# Patient Record
Sex: Male | Born: 1980 | Race: Black or African American | Hispanic: No | Marital: Married | Smoking: Never smoker
Health system: Southern US, Community
[De-identification: ages and names within clinical notes are randomized; demographics above are authoritative.]

## PROBLEM LIST (undated history)

## (undated) DIAGNOSIS — M3313 Other dermatomyositis without myopathy: Secondary | ICD-10-CM

## (undated) DIAGNOSIS — I10 Essential (primary) hypertension: Secondary | ICD-10-CM

## (undated) DIAGNOSIS — M3390 Dermatopolymyositis, unspecified, organ involvement unspecified: Secondary | ICD-10-CM

## (undated) DIAGNOSIS — R519 Headache, unspecified: Secondary | ICD-10-CM

## (undated) HISTORY — DX: Dermatopolymyositis, unspecified, organ involvement unspecified: M33.90

## (undated) HISTORY — DX: Headache, unspecified: R51.9

## (undated) HISTORY — PX: MUSCLE BIOPSY: SHX716

## (undated) HISTORY — DX: Other dermatomyositis without myopathy: M33.13

---
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
## 2010-10-21 ENCOUNTER — Ambulatory Visit: Payer: Self-pay | Admitting: Diagnostic Radiology

## 2010-10-21 ENCOUNTER — Emergency Department (HOSPITAL_BASED_OUTPATIENT_CLINIC_OR_DEPARTMENT_OTHER): Admission: EM | Admit: 2010-10-21 | Discharge: 2010-10-21 | Payer: Self-pay | Admitting: Emergency Medicine

## 2010-10-24 ENCOUNTER — Emergency Department (HOSPITAL_BASED_OUTPATIENT_CLINIC_OR_DEPARTMENT_OTHER): Admission: EM | Admit: 2010-10-24 | Discharge: 2010-10-24 | Payer: Self-pay | Admitting: Emergency Medicine

## 2011-04-19 ENCOUNTER — Emergency Department (HOSPITAL_BASED_OUTPATIENT_CLINIC_OR_DEPARTMENT_OTHER)
Admission: EM | Admit: 2011-04-19 | Discharge: 2011-04-19 | Disposition: A | Discharge status: Home / Self Care | Payer: Self-pay | Attending: Emergency Medicine | Admitting: Emergency Medicine

## 2011-04-19 DIAGNOSIS — K029 Dental caries, unspecified: Secondary | ICD-10-CM | POA: Insufficient documentation

## 2011-04-19 DIAGNOSIS — K047 Periapical abscess without sinus: Secondary | ICD-10-CM | POA: Insufficient documentation

## 2012-11-21 ENCOUNTER — Emergency Department (HOSPITAL_BASED_OUTPATIENT_CLINIC_OR_DEPARTMENT_OTHER): Payer: Self-pay

## 2012-11-21 ENCOUNTER — Emergency Department (HOSPITAL_BASED_OUTPATIENT_CLINIC_OR_DEPARTMENT_OTHER)
Admission: EM | Admit: 2012-11-21 | Discharge: 2012-11-21 | Disposition: A | Discharge status: Home / Self Care | Payer: Self-pay | Attending: Emergency Medicine | Admitting: Emergency Medicine

## 2012-11-21 ENCOUNTER — Encounter (HOSPITAL_BASED_OUTPATIENT_CLINIC_OR_DEPARTMENT_OTHER): Payer: Self-pay | Admitting: Student

## 2012-11-21 DIAGNOSIS — J029 Acute pharyngitis, unspecified: Secondary | ICD-10-CM | POA: Insufficient documentation

## 2012-11-21 DIAGNOSIS — J3489 Other specified disorders of nose and nasal sinuses: Secondary | ICD-10-CM | POA: Insufficient documentation

## 2012-11-21 DIAGNOSIS — R05 Cough: Secondary | ICD-10-CM

## 2012-11-21 DIAGNOSIS — R059 Cough, unspecified: Secondary | ICD-10-CM | POA: Insufficient documentation

## 2012-11-21 DIAGNOSIS — R0789 Other chest pain: Secondary | ICD-10-CM | POA: Insufficient documentation

## 2012-11-21 DIAGNOSIS — F17200 Nicotine dependence, unspecified, uncomplicated: Secondary | ICD-10-CM | POA: Insufficient documentation

## 2012-11-21 MED ORDER — BENZONATATE 100 MG PO CAPS: 100.0000 mg | ORAL_CAPSULE | Freq: Three times a day (TID) | ORAL | Status: DC

## 2012-11-21 NOTE — ED Provider Notes (Signed)
Medical screening examination/treatment/procedure(s) were performed by non-physician practitioner and as supervising physician I was immediately available for consultation/collaboration.  Marwan T Powers, MD 11/21/12 2219 

## 2012-11-21 NOTE — ED Notes (Signed)
NP at bedside.

## 2012-11-21 NOTE — ED Provider Notes (Signed)
History     CSN: [id number]  Arrival date & time 11/21/12  1527   First MD Initiated Contact with Patient 11/21/12 1625      Chief Complaint  Patient presents with  . Cough  . Sore Throat    (Consider location/radiation/quality/duration/timing/severity/associated sxs/prior treatment) HPI Comments: Pt states that he has generalized cp when he coughs:pt denies fever  Patient is a 31 y.o. male presenting with cough. The history is provided by the patient. No language interpreter was used.  Cough This is a new problem. The current episode started more than 2 days ago. The problem occurs constantly. The problem has not changed since onset.The cough is productive of sputum. There has been no fever. Associated symptoms include rhinorrhea and sore throat. He has tried nothing for the symptoms. He is a smoker.    History reviewed. No pertinent past medical history.  History reviewed. No pertinent past surgical history.  History reviewed. No pertinent family history.  History  Substance Use Topics  . Smoking status: Current Every Day Smoker  . Smokeless tobacco: Not on file  . Alcohol Use: Yes      Review of Systems  Constitutional: Negative.   HENT: Positive for sore throat and rhinorrhea.   Respiratory: Negative.   Cardiovascular: Negative.     Allergies  Penicillins  Home Medications  No current outpatient prescriptions on file.  BP 148/89  Pulse 56  Temp 98.6 F (37 C) (Oral)  Ht 5\' 11"  (1.803 m)  Wt 300 lb (136.079 kg)  BMI 41.84 kg/m2  SpO2 100%  Physical Exam  Nursing note and vitals reviewed. Constitutional: He is oriented to person, place, and time. He appears well-developed and well-nourished.  HENT:  Head: Normocephalic and atraumatic.  Right Ear: External ear normal.  Left Ear: External ear normal.  Nose: Rhinorrhea present.  Mouth/Throat: Posterior oropharyngeal erythema present.  Cardiovascular: Normal rate and regular rhythm.     Pulmonary/Chest: Effort normal and breath sounds normal.  Musculoskeletal: Normal range of motion.  Neurological: He is alert and oriented to person, place, and time.  Skin: Skin is dry.    ED Course  Procedures (including critical care time)  Labs Reviewed - No data to display Dg Chest 2 View  11/21/2012  *RADIOLOGY REPORT*  Clinical Data: 31 year old male with chest pain and cough.  CHEST - 2 VIEW  Comparison: None  Findings: The cardiomediastinal silhouette is unremarkable. There is no evidence of focal airspace disease, pulmonary edema, suspicious pulmonary nodule/mass, pleural effusion, or pneumothorax. No acute bony abnormalities are identified.  IMPRESSION: No evidence of active cardiopulmonary disease.   Original Report Authenticated By: Harmon Pier, M.D.      1. Cough       MDM  Will treat symptomatically with something for the cough       Teressa Lower, NP 11/21/12 1746

## 2012-11-21 NOTE — ED Notes (Signed)
Pt in with c/o URI s/sx with cough, congestion sore throat and pain in chest when coughing

## 2013-08-03 ENCOUNTER — Encounter (HOSPITAL_BASED_OUTPATIENT_CLINIC_OR_DEPARTMENT_OTHER): Payer: Self-pay

## 2013-08-03 ENCOUNTER — Emergency Department (HOSPITAL_BASED_OUTPATIENT_CLINIC_OR_DEPARTMENT_OTHER)
Admission: EM | Admit: 2013-08-03 | Discharge: 2013-08-03 | Disposition: A | Discharge status: Home / Self Care | Payer: Self-pay | Attending: Emergency Medicine | Admitting: Emergency Medicine

## 2013-08-03 DIAGNOSIS — B353 Tinea pedis: Secondary | ICD-10-CM | POA: Insufficient documentation

## 2013-08-03 DIAGNOSIS — F17200 Nicotine dependence, unspecified, uncomplicated: Secondary | ICD-10-CM | POA: Insufficient documentation

## 2013-08-03 DIAGNOSIS — Z79899 Other long term (current) drug therapy: Secondary | ICD-10-CM | POA: Insufficient documentation

## 2013-08-03 DIAGNOSIS — Z880 Allergy status to penicillin: Secondary | ICD-10-CM | POA: Insufficient documentation

## 2013-08-03 DIAGNOSIS — I10 Essential (primary) hypertension: Secondary | ICD-10-CM | POA: Insufficient documentation

## 2013-08-03 MED ORDER — FLUCONAZOLE 200 MG PO TABS: 200.0000 mg | ORAL_TABLET | ORAL | Status: AC

## 2013-08-03 NOTE — ED Provider Notes (Signed)
CSN: [id number]     Arrival date & time 08/03/13  4098 History   First MD Initiated Contact with Patient 08/03/13 [phone]     Chief Complaint  Patient presents with  . Foot Swelling   (Consider location/radiation/quality/duration/timing/severity/associated sxs/prior Treatment) HPI 32 y.o. Male presents with cracked painful areas between toes on right. Previouslydiagnosedwith tinea pedis and used otc topical antifungal cream for a month without resolution.  STates stopped usding this several weeks ago and now worse with some more bleeding and pain and swelling over intertriginous area of toe 4 and 5 on right foot.   No past medical history on file. No past surgical history on file. No family history on file. History  Substance Use Topics  . Smoking status: Current Every Day Smoker  . Smokeless tobacco: Not on file  . Alcohol Use: Yes    Review of Systems  All other systems reviewed and are negative.    Allergies  Penicillins  Home Medications   Current Outpatient Rx  Name  Route  Sig  Dispense  Refill  . benzonatate (TESSALON) 100 MG capsule   Oral   Take 1 capsule (100 mg total) by mouth every 8 (eight) hours.   21 capsule   0    BP 151/102  Pulse 60  Temp(Src) 98 F (36.7 C) (Oral)  Resp 16  SpO2 98% Physical Exam  Nursing note and vitals reviewed. Constitutional: He appears well-developed and well-nourished.  HENT:  Head: Normocephalic and atraumatic.  Musculoskeletal: Normal range of motion.       Feet:  Maceration with some skin scaling and thickening with fissuring present.   Ankle, calf,and proximal leg normal.     ED Course  Procedures (including critical care time) Labs Review Labs Reviewed - No data to display Imaging Review No results found.  MDM  No diagnosis found. 32 y.o. 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 He will be treated with fluconazole for 4 weeks.  He is given instructions and printed information.       Hilario Quarry, MD 08/03/13 1002

## 2013-08-03 NOTE — ED Notes (Signed)
Swollen leg onset yesterday worsen this morning upon waking up. Non-pitting edema localized to the right foot only. Painful to walk and touch, sensation intact, able to ambulate.

## 2013-08-10 ENCOUNTER — Encounter (HOSPITAL_BASED_OUTPATIENT_CLINIC_OR_DEPARTMENT_OTHER): Payer: Self-pay | Admitting: *Deleted

## 2013-08-10 ENCOUNTER — Emergency Department (HOSPITAL_BASED_OUTPATIENT_CLINIC_OR_DEPARTMENT_OTHER)
Admission: EM | Admit: 2013-08-10 | Discharge: 2013-08-10 | Disposition: A | Discharge status: Home / Self Care | Payer: Self-pay | Attending: Emergency Medicine | Admitting: Emergency Medicine

## 2013-08-10 DIAGNOSIS — Z880 Allergy status to penicillin: Secondary | ICD-10-CM | POA: Insufficient documentation

## 2013-08-10 DIAGNOSIS — F17200 Nicotine dependence, unspecified, uncomplicated: Secondary | ICD-10-CM | POA: Insufficient documentation

## 2013-08-10 DIAGNOSIS — W57XXXA Bitten or stung by nonvenomous insect and other nonvenomous arthropods, initial encounter: Secondary | ICD-10-CM | POA: Insufficient documentation

## 2013-08-10 DIAGNOSIS — Y9389 Activity, other specified: Secondary | ICD-10-CM | POA: Insufficient documentation

## 2013-08-10 DIAGNOSIS — Y929 Unspecified place or not applicable: Secondary | ICD-10-CM | POA: Insufficient documentation

## 2013-08-10 DIAGNOSIS — S1096XA Insect bite of unspecified part of neck, initial encounter: Secondary | ICD-10-CM | POA: Insufficient documentation

## 2013-08-10 DIAGNOSIS — L989 Disorder of the skin and subcutaneous tissue, unspecified: Secondary | ICD-10-CM

## 2013-08-10 MED ORDER — MUPIROCIN CALCIUM 2 % EX CREA
TOPICAL_CREAM | Freq: Once | CUTANEOUS | Status: AC
  Administered 2013-08-10: 1 via TOPICAL
  Filled 2013-08-10: qty 15

## 2013-08-10 NOTE — ED Notes (Signed)
MD at bedside. 

## 2013-08-10 NOTE — ED Provider Notes (Signed)
CSN: [id number]     Arrival date & time 08/10/13  2253 History  This chart was scribed for No att. providers found by Joaquin Music, ED Scribe. This patient was seen in room MH07/MH07 and the patient's care was started at 11:09 PM     Chief Complaint  Patient presents with  . Insect Bite   HPI This is a 32 year old male who states he was bitten on the right occipital scalp 4 days ago. He thinks it was an insect. Since then he has developed a small crusted papule. He states it is not painful but is "irritating". He has been mashing it but has not been able to express any pus. There is no associated lymphadenopathy or systemic symptoms such as fever or malaise.  History reviewed. No pertinent past medical history. History reviewed. No pertinent past surgical history. History reviewed. No pertinent family history. History  Substance Use Topics  . Smoking status: Current Every Day Smoker  . Smokeless tobacco: Not on file  . Alcohol Use: No    Review of Systems  All other systems reviewed and are negative.    Allergies  Penicillins  Home Medications   Current Outpatient Rx  Name  Route  Sig  Dispense  Refill  . benzonatate (TESSALON) 100 MG capsule   Oral   Take 1 capsule (100 mg total) by mouth every 8 (eight) hours.   21 capsule   0   . fluconazole (DIFLUCAN) 200 MG tablet   Oral   Take 1 tablet (200 mg total) by mouth once a week.   6 tablet   0    BP 160/92  Pulse 89  Temp(Src) 98.3 F (36.8 C)  Resp 20  SpO2 100%  Physical Exam General: Well-developed, well-nourished male in no acute distress; appearance consistent with age of record HENT: normocephalic; atraumatic Eyes: Normal appearance Neck: supple; no lymphadenopathy Heart: regular rate and rhythm Lungs: Normal respiratory effort and excursion Abdomen: soft; nondistended Extremities: No deformity; full range of motion Neurologic: Awake, alert and oriented; motor function intact in all  extremities and symmetric; no facial droop Skin: Warm and dry; crusted papule right occipital scalp Psychiatric: Normal mood and affect    ED Course  Procedures DIAGNOSTIC STUDIES: Oxygen Saturation is 100% on RA, normal by my interpretation.     MDM      Hanley Seamen, MD 08/10/13 [phone]

## 2013-08-10 NOTE — ED Notes (Signed)
Patient states that he was bitten by ? Insect about 4 days ago on the back of his head.

## 2014-04-03 ENCOUNTER — Encounter (HOSPITAL_BASED_OUTPATIENT_CLINIC_OR_DEPARTMENT_OTHER): Payer: Self-pay | Admitting: Emergency Medicine

## 2014-04-03 ENCOUNTER — Emergency Department (HOSPITAL_BASED_OUTPATIENT_CLINIC_OR_DEPARTMENT_OTHER)
Admission: EM | Admit: 2014-04-03 | Discharge: 2014-04-03 | Disposition: A | Discharge status: Home / Self Care | Payer: Self-pay | Attending: Emergency Medicine | Admitting: Emergency Medicine

## 2014-04-03 DIAGNOSIS — B353 Tinea pedis: Secondary | ICD-10-CM

## 2014-04-03 DIAGNOSIS — B351 Tinea unguium: Secondary | ICD-10-CM

## 2014-04-03 DIAGNOSIS — Z880 Allergy status to penicillin: Secondary | ICD-10-CM | POA: Insufficient documentation

## 2014-04-03 DIAGNOSIS — Z87891 Personal history of nicotine dependence: Secondary | ICD-10-CM | POA: Insufficient documentation

## 2014-04-03 NOTE — ED Notes (Signed)
Rt great toe nail was painful and was told had fungus  Then went to nail spa,  Still painful

## 2014-04-03 NOTE — ED Notes (Signed)
MD at bedside. 

## 2014-04-03 NOTE — ED Provider Notes (Signed)
CSN: [id number]     Arrival date & time 04/03/14  2311 History   This chart was scribed for Andrew Byrd Smitty CordsK Andrew Odom-Rasch, MD by Carl Bestelina Holson, ED Scribe. This patient was seen in room MH03/MH03 and the patient's care was started at 11:38 PM.     Chief Complaint  Patient presents with  . Nail Problem   The history is provided by the patient. No language interpreter was used.   HPI Comments: Andrew Byrd is a 33 y.o. male who presents to the Emergency Department complaining of constant right toenail pain caused by toenail fungus.  He states that he got a pedicure today which aggravated his pain.  The patient states that he has tried topical fungal removal cream with no relief to his symptoms.    History reviewed. No pertinent past medical history. History reviewed. No pertinent past surgical history. No family history on file. History  Substance Use Topics  . Smoking status: Former Games developermoker  . Smokeless tobacco: Not on file  . Alcohol Use: No    Review of Systems  Musculoskeletal: Positive for arthralgias.  All other systems reviewed and are negative.     Allergies  Penicillins  Home Medications   Prior to Admission medications   Not on File   Triage Vitals: BP 151/95  Pulse 58  Temp(Src) 98 F (36.7 C) (Oral)  Resp 20  Ht 5\' 10"  (1.778 m)  Wt 280 lb (127.007 kg)  BMI 40.18 kg/m2  SpO2 98%  Physical Exam  Constitutional: He is oriented to person, place, and time. He appears well-developed and well-nourished. No distress.  HENT:  Head: Normocephalic and atraumatic.  Right Ear: External ear normal.  Left Ear: External ear normal.  Mouth/Throat: Oropharynx is clear and moist. No oropharyngeal exudate.  Eyes: Conjunctivae are normal. Pupils are equal, round, and reactive to light. No scleral icterus.  Neck: Normal range of motion. Neck supple. No thyromegaly present.  Cardiovascular: Normal rate and regular rhythm.  Exam reveals no gallop and no friction rub.   No murmur  heard. Pulmonary/Chest: Effort normal and breath sounds normal. No respiratory distress. He has no wheezes. He has no rales.  Abdominal: Soft. Bowel sounds are normal. He exhibits no distension. There is no tenderness. There is no rebound.  Musculoskeletal: Normal range of motion.  Neurological: He is alert and oriented to person, place, and time.  Skin: Skin is warm and dry. No rash noted.  Onchomycosis of the digits of the right foot.  Psychiatric: He has a normal mood and affect. His behavior is normal.    ED Course  Procedures (including critical care time)  DIAGNOSTIC STUDIES: Oxygen Saturation is 98% on room air, normal by my interpretation.    COORDINATION OF CARE: 11:40 PM- Advised the patient to follow-up with a podiatrist or family doctor and the patient agreed to the treatment plan.   Labs Review Labs Reviewed - No data to display  Imaging Review No results found.   EKG Interpretation None      MDM   Final diagnoses:  None    onchomycosis of toenails.  Follow up with podiatry for ongoing care.     I personally performed the services described in this documentation, which was scribed in my presence. The recorded information has been reviewed and is accurate.    Jasmine AweApril K Zamoria Boss-Rasch, MD 04/04/14 [phone]

## 2014-04-03 NOTE — Discharge Instructions (Signed)
Athlete's Foot °Tinea Pedis, more commonly know as athlete's foot, is a contagious fungal skin infection of the feet. Athlete's foot usually affects the skin between the fourth and fifth toes. It is called athlete's foot because it is a common occurrence in athletes. °SYMPTOMS  °· Scales on the feet, most commonly between the toes, that appear moist, soft, gray-white, or red. °· Dead skin between the toes. °· Itching in the inflamed areas. °· Damp, musty foot odor. °· Sometimes, small blisters on the feet, caused by a hypersensitivity to the fungus. °CAUSES  °Infection is caused by contact with a fungus or yeast. The fungus or yeast typically reside in moist socks and shoes, because the fungus thrives in dark, moist environments. °RISK INCREASES WITH: °· Walking barefoot public places such as bathrooms or showers °· Poor hygiene of the feet: infrequent washing of the feet and/or infrequent changing of shoes or socks. °· Hot, humid weather. °· Forgetting to dry the spaces between your toes. °PREVENTION °· Follow good locker room hygiene. °· Use your own towels. °· Wear shoes or sandals. °· Wash with warm or hot water and soap. °· Wash your feet daily. Dry thoroughly, especially between the toes. Dust with talcum powder or antifungal powder. °· Allow feet to dry out by occasionally walking barefoot. °· Change socks daily. °· Wear socks made of cotton, wool, or other natural absorbent fibers. Avoid socks made from synthetic fibers. °PROGNOSIS  °With appropriate treatment athlete's foot typically may resolve within 3 weeks, although, recurrence is common.  °RELATED COMPLICATIONS  °· Chronic infection or recurrence, especially if not appropriately or completely treated. °· Bacterial infection on top of the fungal infection in the affected area (bacterial superinfection or secondary bacterial infection). °· Rarely, an allergic autoimmune response to the infection on the hands and face. °TREATMENT °Initially, keep the  affected area dry and cool. Remove the scaly and dead material if it is present. Change socks daily. Walking barefoot or wearing sandals whenever possible helps dry the affected area. Use antifungal powders, creams, or ointments after each bath. If the infection does not respond to topical treatment, contact your caregiver and he or she may prescribe oral antifungal medication. °MEDICATION  °· Non-prescription antifungal creams, ointments, or powders can be used on your feet or toes or in shoes. °· Anti-itch medications may be prescribed as necessary by your caregiver. Use only as directed and only as much as you need. °· Oral antifungal medications may be prescribed by your caregiver. Take the entire course of medication as prescribed. °SEEK MEDICAL CARE IF:  °· Symptoms get worse or do not improve in 2 weeks despite treatment. °· New, unexplained symptoms develop (drugs used in treatment may produce side effects). °Document Released: 11/13/2005 Document Revised: 02/05/2012 Document Reviewed: 02/25/2009 °[employer]® Patient Information ©2014 [employer]. ° °

## 2014-04-04 ENCOUNTER — Encounter (HOSPITAL_BASED_OUTPATIENT_CLINIC_OR_DEPARTMENT_OTHER): Payer: Self-pay | Admitting: Emergency Medicine

## 2015-10-07 ENCOUNTER — Encounter (HOSPITAL_BASED_OUTPATIENT_CLINIC_OR_DEPARTMENT_OTHER): Payer: Self-pay | Admitting: *Deleted

## 2015-10-07 ENCOUNTER — Emergency Department (HOSPITAL_BASED_OUTPATIENT_CLINIC_OR_DEPARTMENT_OTHER)
Admission: EM | Admit: 2015-10-07 | Discharge: 2015-10-07 | Disposition: A | Discharge status: Home / Self Care | Payer: Self-pay | Attending: Emergency Medicine | Admitting: Emergency Medicine

## 2015-10-07 DIAGNOSIS — I10 Essential (primary) hypertension: Secondary | ICD-10-CM | POA: Insufficient documentation

## 2015-10-07 DIAGNOSIS — R600 Localized edema: Secondary | ICD-10-CM | POA: Insufficient documentation

## 2015-10-07 DIAGNOSIS — M7989 Other specified soft tissue disorders: Secondary | ICD-10-CM

## 2015-10-07 DIAGNOSIS — Z880 Allergy status to penicillin: Secondary | ICD-10-CM | POA: Insufficient documentation

## 2015-10-07 DIAGNOSIS — Z87891 Personal history of nicotine dependence: Secondary | ICD-10-CM | POA: Insufficient documentation

## 2015-10-07 HISTORY — DX: Essential (primary) hypertension: I10

## 2015-10-07 LAB — CBC WITH DIFFERENTIAL/PLATELET
BASOS ABS: 0 10*3/uL (ref 0.0–0.1)
Basophils Relative: 0 %
EOS ABS: 0.2 10*3/uL (ref 0.0–0.7)
Eosinophils Relative: 4 %
HEMATOCRIT: 40.3 % (ref 39.0–52.0)
HEMOGLOBIN: 12.8 g/dL — AB (ref 13.0–17.0)
LYMPHS PCT: 31 %
Lymphs Abs: 1.6 10*3/uL (ref 0.7–4.0)
MCH: 30.8 pg (ref 26.0–34.0)
MCHC: 31.8 g/dL (ref 30.0–36.0)
MCV: 96.9 fL (ref 78.0–100.0)
MONOS PCT: 9 %
Monocytes Absolute: 0.5 10*3/uL (ref 0.1–1.0)
NEUTROS PCT: 56 %
Neutro Abs: 3 10*3/uL (ref 1.7–7.7)
Platelets: 186 10*3/uL (ref 150–400)
RBC: 4.16 MIL/uL — AB (ref 4.22–5.81)
RDW: 13.8 % (ref 11.5–15.5)
WBC: 5.3 10*3/uL (ref 4.0–10.5)

## 2015-10-07 LAB — BASIC METABOLIC PANEL
ANION GAP: 6 (ref 5–15)
BUN: 12 mg/dL (ref 6–20)
CHLORIDE: 108 mmol/L (ref 101–111)
CO2: 26 mmol/L (ref 22–32)
CREATININE: 0.86 mg/dL (ref 0.61–1.24)
Calcium: 9 mg/dL (ref 8.9–10.3)
GFR calc non Af Amer: >60 mL/min (ref >60)
Glucose, Bld: 105 mg/dL — ABNORMAL HIGH (ref 65–99)
Potassium: 4.2 mmol/L (ref 3.5–5.1)
SODIUM: 140 mmol/L (ref 135–145)

## 2015-10-07 NOTE — ED Notes (Signed)
MD at bedside. 

## 2015-10-07 NOTE — ED Provider Notes (Signed)
CSN: [id number]     Arrival date & time 10/07/15  [id number] History   First MD Initiated Contact with Patient 10/07/15 0830     Chief Complaint  Patient presents with  . Foot Swelling     (Consider location/radiation/quality/duration/timing/severity/associated sxs/prior Treatment) HPI Comments: Patient is a 34 year old male with history of hypertension but has not been on medication for several years presents today with a 1 year history of swelling in his lower legs bilaterally. He states that maybe it's gotten worse in the last few weeks to 1 month. He denies any pain in the legs, chest pain nursing will shortness of breath. 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 He denies any unilateral leg pain or swelling worsen one leg. It seems to be worse when he wakes up in the morning and improves as time goes on. He denies any joint pain or swelling.  The history is provided by the patient.    Past Medical History  Diagnosis Date  . Hypertension    History reviewed. No pertinent past surgical history. No family history on file. Social History  Substance Use Topics  . Smoking status: Former Games developermoker  . Smokeless tobacco: None  . Alcohol Use: No    Review of Systems  All other systems reviewed and are negative.     Allergies  Other and Penicillins  Home Medications   Prior to Admission medications   Not on File   BP 137/80 mmHg  Pulse 62  Temp(Src) 98.3 F (36.8 C) (Oral)  Resp 16  Ht 5\' 11"  (1.803 m)  Wt 280 lb (127.007 kg)  BMI 39.07 kg/m2  SpO2 98% Physical Exam  Constitutional: He is oriented to person, place, and time. He appears well-developed and well-nourished. No distress.  HENT:  Head: Normocephalic and atraumatic.  Mouth/Throat: Oropharynx is clear and moist.  Eyes: Conjunctivae and EOM are normal. Pupils are equal, round, and reactive to light.  Neck: Normal range of motion. Neck supple.  Cardiovascular: Normal rate, regular  rhythm and intact distal pulses.   No murmur heard. Pulmonary/Chest: Effort normal and breath sounds normal. No respiratory distress. He has no wheezes. He has no rales.  Abdominal: Soft. He exhibits no distension. There is no tenderness. There is no rebound and no guarding.  Musculoskeletal: Normal range of motion. He exhibits edema. He exhibits no tenderness.  Trace edema to bilateral lower extremities  Neurological: He is alert and oriented to person, place, and time.  Skin: Skin is warm and dry. No rash noted. No erythema.  Psychiatric: He has a normal mood and affect. His behavior is normal.  Nursing note and vitals reviewed.   ED Course  Procedures (including critical care time) Labs Review Labs Reviewed  CBC WITH DIFFERENTIAL/PLATELET - Abnormal; Notable for the following:    RBC 4.16 (*)    Hemoglobin 12.8 (*)    All other components within normal limits  BASIC METABOLIC PANEL - Abnormal; Notable for the following:    Glucose, Bld 105 (*)    All other components within normal limits    Imaging Review No results found. I have personally reviewed and evaluated these images and lab results as part of my medical decision-making.   EKG Interpretation None      MDM   Final diagnoses:  Swelling of lower extremity    Patient presenting today with complaint of lower extremities swelling over the last year but worse in the last  few weeks. The swelling is worse in the morning and improves as the day goes on. He denies any shortness of breath, chest pain, headaches. He once was on medication for blood pressure but has not taken anything in quite some time. 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 On exam patient's vital signs are within normal limits including his blood pressure. Minimal trace edema in bilateral lower extremities with evidence of varicose veins but normal pulses. CBC and BMP are within normal  limits without evidence of renal disease or diabetes. Low suspicion the patient's lower extremity edema is related to a cardiac cause him feel most likely venous insufficiency. Encourage patient to avoid excessive salt in his diet as well as try compression stockings and if that doesn't work follow-up with the PCP.    Gwyneth Sprout, MD 10/07/15 1330

## 2015-10-07 NOTE — Discharge Instructions (Signed)
Edema °Edema is an abnormal buildup of fluids in your body tissues. Edema is somewhat dependent on gravity to pull the fluid to the lowest place in your body. That makes the condition more common in the legs and thighs (lower extremities). Painless swelling of the feet and ankles is common and becomes more likely as you get older. It is also common in looser tissues, like around your eyes.  °When the affected area is squeezed, the fluid may move out of that spot and leave a dent for a few moments. This dent is called pitting.  °CAUSES  °There are many possible causes of edema. Eating too much salt and being on your feet or sitting for a long time can cause edema in your legs and ankles. Hot weather may make edema worse. Common medical causes of edema include: °· Heart failure. °· Liver disease. °· Kidney disease. °· Weak blood vessels in your legs. °· Cancer. °· An injury. °· Pregnancy. °· Some medications. °· Obesity.  °SYMPTOMS  °Edema is usually painless. Your skin may look swollen or shiny.  °DIAGNOSIS  °Your health care provider may be able to diagnose edema by asking about your medical history and doing a physical exam. You may need to have tests such as X-rays, an electrocardiogram, or blood tests to check for medical conditions that may cause edema.  °TREATMENT  °Edema treatment depends on the cause. If you have heart, liver, or kidney disease, you need the treatment appropriate for these conditions. General treatment may include: °· Elevation of the affected body part above the level of your heart. °· Compression of the affected body part. Pressure from elastic bandages or support stockings squeezes the tissues and forces fluid back into the blood vessels. This keeps fluid from entering the tissues. °· Restriction of fluid and salt intake. °· Use of a water pill (diuretic). These medications are appropriate only for some types of edema. They pull fluid out of your body and make you urinate more often. This  gets rid of fluid and reduces swelling, but diuretics can have side effects. Only use diuretics as directed by your health care provider. °HOME CARE INSTRUCTIONS  °· Keep the affected body part above the level of your heart when you are lying down.   °· Do not sit still or stand for prolonged periods.   °· Do not put anything directly under your knees when lying down. °· Do not wear constricting clothing or garters on your upper legs.   °· Exercise your legs to work the fluid back into your blood vessels. This may help the swelling go down.   °· Wear elastic bandages or support stockings to reduce ankle swelling as directed by your health care provider.   °· Eat a low-salt diet to reduce fluid if your health care provider recommends it.   °· Only take medicines as directed by your health care provider.  °SEEK MEDICAL CARE IF:  °· Your edema is not responding to treatment. °· You have heart, liver, or kidney disease and notice symptoms of edema. °· You have edema in your legs that does not improve after elevating them.   °· You have sudden and unexplained weight gain. °SEEK IMMEDIATE MEDICAL CARE IF:  °· You develop shortness of breath or chest pain.   °· You cannot breathe when you lie down. °· You develop pain, redness, or warmth in the swollen areas.   °· You have heart, liver, or kidney disease and suddenly get edema. °· You have a fever and your symptoms suddenly get worse. °MAKE SURE YOU:  °·   Understand these instructions. °· Will watch your condition. °· Will get help right away if you are not doing well or get worse. °  °This information is not intended to replace advice given to you by your health care provider. Make sure you discuss any questions you have with your health care provider. °  °Document Released: 11/13/2005 Document Revised: 12/04/2014 Document Reviewed: 09/05/2013 °[employer]vier Interactive Patient Education ©2016 [employer] ° °

## 2015-10-07 NOTE — ED Notes (Signed)
Patient states he has had lower extremity swelling for over one year.  Past history of HTN.

## 2016-08-23 ENCOUNTER — Encounter (HOSPITAL_BASED_OUTPATIENT_CLINIC_OR_DEPARTMENT_OTHER): Payer: Self-pay | Admitting: *Deleted

## 2016-08-23 ENCOUNTER — Emergency Department (HOSPITAL_BASED_OUTPATIENT_CLINIC_OR_DEPARTMENT_OTHER)
Admission: EM | Admit: 2016-08-23 | Discharge: 2016-08-23 | Disposition: A | Discharge status: Home / Self Care | Payer: No Typology Code available for payment source | Attending: Emergency Medicine | Admitting: Emergency Medicine

## 2016-08-23 DIAGNOSIS — R519 Headache, unspecified: Secondary | ICD-10-CM

## 2016-08-23 DIAGNOSIS — I10 Essential (primary) hypertension: Secondary | ICD-10-CM | POA: Diagnosis not present

## 2016-08-23 DIAGNOSIS — R51 Headache: Secondary | ICD-10-CM | POA: Insufficient documentation

## 2016-08-23 DIAGNOSIS — Z87891 Personal history of nicotine dependence: Secondary | ICD-10-CM | POA: Diagnosis not present
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
## 2017-04-26 ENCOUNTER — Emergency Department (HOSPITAL_BASED_OUTPATIENT_CLINIC_OR_DEPARTMENT_OTHER): Payer: Self-pay

## 2017-04-26 ENCOUNTER — Encounter (HOSPITAL_BASED_OUTPATIENT_CLINIC_OR_DEPARTMENT_OTHER): Payer: Self-pay | Admitting: *Deleted

## 2017-04-26 ENCOUNTER — Emergency Department (HOSPITAL_BASED_OUTPATIENT_CLINIC_OR_DEPARTMENT_OTHER)
Admission: EM | Admit: 2017-04-26 | Discharge: 2017-04-26 | Disposition: A | Discharge status: Home / Self Care | Payer: Self-pay | Attending: Emergency Medicine | Admitting: Emergency Medicine

## 2017-04-26 DIAGNOSIS — J069 Acute upper respiratory infection, unspecified: Secondary | ICD-10-CM | POA: Insufficient documentation

## 2017-04-26 DIAGNOSIS — B9789 Other viral agents as the cause of diseases classified elsewhere: Secondary | ICD-10-CM

## 2017-04-26 DIAGNOSIS — J988 Other specified respiratory disorders: Secondary | ICD-10-CM

## 2017-04-26 DIAGNOSIS — I10 Essential (primary) hypertension: Secondary | ICD-10-CM | POA: Insufficient documentation

## 2017-04-26 DIAGNOSIS — R51 Headache: Secondary | ICD-10-CM | POA: Insufficient documentation

## 2017-04-26 DIAGNOSIS — Z87891 Personal history of nicotine dependence: Secondary | ICD-10-CM | POA: Insufficient documentation

## 2017-04-26 LAB — RAPID STREP SCREEN (MED CTR MEBANE ONLY): Streptococcus, Group A Screen (Direct): NEGATIVE

## 2017-04-26 MED ORDER — IPRATROPIUM-ALBUTEROL 0.5-2.5 (3) MG/3ML IN SOLN
3.0000 mL | Freq: Once | RESPIRATORY_TRACT | Status: AC
  Administered 2017-04-26: 3 mL via RESPIRATORY_TRACT
  Filled 2017-04-26: qty 3

## 2017-04-26 MED ORDER — IBUPROFEN 800 MG PO TABS: 800.0000 mg | ORAL_TABLET | Freq: Three times a day (TID) | ORAL | 0 refills | Status: DC | PRN

## 2017-04-26 MED ORDER — FLUTICASONE PROPIONATE 50 MCG/ACT NA SUSP: 2.0000 | Freq: Every day | NASAL | 0 refills | Status: DC

## 2017-04-26 MED ORDER — KETOROLAC TROMETHAMINE 60 MG/2ML IM SOLN
60.0000 mg | Freq: Once | INTRAMUSCULAR | Status: DC
  Filled 2017-04-26: qty 2

## 2017-04-26 MED ORDER — GUAIFENESIN-DM 100-10 MG/5ML PO SYRP: 5.0000 mL | ORAL_SOLUTION | ORAL | 0 refills | Status: DC | PRN

## 2017-04-26 MED ORDER — ALBUTEROL SULFATE HFA 108 (90 BASE) MCG/ACT IN AERS
2.0000 | INHALATION_SPRAY | Freq: Once | RESPIRATORY_TRACT | Status: AC
  Administered 2017-04-26: 2 via RESPIRATORY_TRACT
  Filled 2017-04-26: qty 6.7

## 2017-04-26 MED ORDER — PSEUDOEPHEDRINE HCL 60 MG PO TABS: 60.0000 mg | ORAL_TABLET | Freq: Four times a day (QID) | ORAL | 0 refills | Status: DC | PRN

## 2017-04-26 MED FILL — SUDOGEST 60 MG TABLET: 60 | 8 days supply | Qty: 30 | Fill #0

## 2017-04-26 MED FILL — FLUTICASONE PROP 50 MCG SPR: 50 | 30 days supply | Qty: 16 | Fill #0

## 2017-04-26 MED FILL — IBUPROFEN 800 MG TAB: 800 | 5 days supply | Qty: 15 | Fill #0

## 2017-04-26 NOTE — Discharge Instructions (Signed)
Read the information below.  Use the prescribed medication as directed.  Please discuss all new medications with your pharmacist.  You may return to the Emergency Department at any time for worsening condition or any new symptoms that concern you.  If you develop high fevers that do not resolve with tylenol or ibuprofen, you have difficulty swallowing or breathing, or you are unable to tolerate fluids by mouth, return to the ER for a recheck.    °

## 2017-04-26 NOTE — ED Triage Notes (Signed)
Sore throat x 3 days. Chills. Headache.

## 2017-04-26 NOTE — ED Provider Notes (Signed)
[facility]   CSN: [id number] Arrival date & time: 04/26/17  1208     History   Chief Complaint Chief Complaint  Patient presents with  . Sore Throat  . Headache    HPI Andrew Byrd is a 36 y.o. male.  HPI   Pt p/w sore throat, nasal congestion, sinus pressure, chills, cough, and headache x 3 days. Loose stools.  Decreased appetite.  Gradually worsening.   Headache is frontal.  Cough is productive of blood tinged sputum.  He is SOB.  He is a smoker and has asthma.     Has tried OTC cold medications and goody powders without significant relief.   Denies fevers, myalgias.   Daughter sick with strep last week.    Past Medical History:  Diagnosis Date  . Hypertension     There are no active problems to display for this patient.   History reviewed. No pertinent surgical history.     Home Medications    Prior to Admission medications   Medication Sig Start Date End Date Taking? Authorizing Provider  fluticasone (FLONASE) 50 MCG/ACT nasal spray Place 2 sprays into both nostrils daily. 04/26/17   Trixie Dredge, PA-C  guaiFENesin-dextromethorphan (ROBITUSSIN DM) 100-10 MG/5ML syrup Take 5 mLs by mouth every 4 (four) hours as needed for cough (and congestion). 04/26/17   Trixie Dredge, PA-C  ibuprofen (ADVIL,MOTRIN) 800 MG tablet Take 1 tablet (800 mg total) by mouth every 8 (eight) hours as needed. 04/26/17   Trixie Dredge, PA-C  pseudoephedrine (SUDAFED) 60 MG tablet Take 1 tablet (60 mg total) by mouth every 6 (six) hours as needed for congestion. 04/26/17   Trixie Dredge, PA-C    Family History No family history on file.  Social History Social History  Substance Use Topics  . Smoking status: Former Games developer  . Smokeless tobacco: Never Used  . Alcohol use No     Allergies   Other and Penicillins   Review of Systems Review of Systems  Constitutional: Positive for appetite change and fatigue.  HENT: Positive for congestion, sinus pain, sinus  pressure and sore throat. Negative for drooling and trouble swallowing.   Respiratory: Positive for cough, shortness of breath and wheezing.   Skin: Negative for rash.  Allergic/Immunologic: Negative for immunocompromised state.     Physical Exam Updated Vital Signs BP 120/65 (BP Location: Right Arm)   Pulse 78   Temp 98.1 F (36.7 C) (Oral)   Resp 18   Ht 5\' 11"  (1.803 m)   Wt (!) 145.2 kg (320 lb)   SpO2 100%   BMI 44.63 kg/m   Physical Exam  Constitutional: He appears well-developed and well-nourished. No distress.  HENT:  Head: Normocephalic and atraumatic.  Nose: Mucosal edema present. Right sinus exhibits maxillary sinus tenderness. Left sinus exhibits maxillary sinus tenderness.  Mouth/Throat: Oropharynx is clear and moist and mucous membranes are normal. Mucous membranes are not dry. No oropharyngeal exudate, posterior oropharyngeal edema, posterior oropharyngeal erythema or tonsillar abscesses.  Eyes: Conjunctivae and EOM are normal. Right eye exhibits no discharge. Left eye exhibits no discharge.  Neck: Normal range of motion. Neck supple.  Cardiovascular: Normal rate and regular rhythm.   Pulmonary/Chest: Effort normal and breath sounds normal. No stridor. No respiratory distress. He has no wheezes. He has no rales.  Coarse breath sounds  Lymphadenopathy:    He has no cervical adenopathy.  Neurological: He is alert.  Skin: He is not diaphoretic.  Nursing note and vitals reviewed.  ED Treatments / Results  Labs (all labs ordered are listed, but only abnormal results are displayed) Labs Reviewed  RAPID STREP SCREEN (NOT AT [facility])  CULTURE, GROUP A STREP [facility])    EKG  EKG Interpretation None       Radiology Dg Chest 2 View  Result Date: 04/26/2017 CLINICAL DATA:  Cough and congestion with dyspnea x3 days. History of smoking. EXAM: CHEST  2 VIEW COMPARISON:  11/21/2012 FINDINGS: The heart size and mediastinal contours are within normal limits. Both  lungs are clear. The visualized skeletal structures are unremarkable. IMPRESSION: No active cardiopulmonary disease. Electronically Signed   By: Tollie Ethavid  Kwon M.D.   On: 04/26/2017 13:24    Procedures Procedures (including critical care time)  Medications Ordered in ED Medications  ketorolac (TORADOL) injection 60 mg (60 mg Intramuscular Not Given 04/26/17 1345)  ipratropium-albuterol (DUONEB) 0.5-2.5 (3) MG/3ML nebulizer solution 3 mL (3 mLs Nebulization Given 04/26/17 1327)  albuterol (PROVENTIL HFA;VENTOLIN HFA) 108 (90 Base) MCG/ACT inhaler 2 puff (2 puffs Inhalation Given 04/26/17 1422)     Initial Impression / Assessment and Plan / ED Course  I have reviewed the triage vital signs and the nursing notes.  Pertinent labs & imaging results that were available during my care of the patient were reviewed by me and considered in my medical decision making (see chart for details).    Afebrile, nontoxic patient with constellation of symptoms suggestive of viral syndrome.  No concerning findings on exam.  Discharged home with supportive care, PCP follow up.  Discussed result, findings, treatment, and follow up  with patient.  Pt given return precautions.  Pt verbalizes understanding and agrees with plan.      Final Clinical Impressions(s) / ED Diagnoses   Final diagnoses:  Viral respiratory illness    New Prescriptions Discharge Medication List as of 04/26/2017  2:11 PM    START taking these medications   Details  fluticasone (FLONASE) 50 MCG/ACT nasal spray Place 2 sprays into both nostrils daily., Starting Thu 04/26/2017, Print    guaiFENesin-dextromethorphan (ROBITUSSIN DM) 100-10 MG/5ML syrup Take 5 mLs by mouth every 4 (four) hours as needed for cough (and congestion)., Starting Thu 04/26/2017, Print    ibuprofen (ADVIL,MOTRIN) 800 MG tablet Take 1 tablet (800 mg total) by mouth every 8 (eight) hours as needed., Starting Thu 04/26/2017, Print    pseudoephedrine (SUDAFED) 60 MG tablet  Take 1 tablet (60 mg total) by mouth every 6 (six) hours as needed for congestion., Starting Thu 04/26/2017, [address]Print         Sathvika Ojo, HookerEmily, PA-C 04/26/17 1640    Geoffery Lyonselo, Douglas, MD 04/27/17 [id number]

## 2017-04-28 LAB — CULTURE, GROUP A STREP (THRC)

## 2018-12-02 ENCOUNTER — Other Ambulatory Visit: Payer: Self-pay

## 2019-01-30 ENCOUNTER — Other Ambulatory Visit: Payer: Self-pay

## 2019-01-30 ENCOUNTER — Encounter (HOSPITAL_BASED_OUTPATIENT_CLINIC_OR_DEPARTMENT_OTHER): Payer: Self-pay | Admitting: Emergency Medicine

## 2019-01-30 ENCOUNTER — Emergency Department (HOSPITAL_BASED_OUTPATIENT_CLINIC_OR_DEPARTMENT_OTHER)
Admission: EM | Admit: 2019-01-30 | Discharge: 2019-01-30 | Disposition: A | Discharge status: Home / Self Care | Payer: Self-pay | Attending: Emergency Medicine | Admitting: Emergency Medicine

## 2019-01-30 DIAGNOSIS — R0981 Nasal congestion: Secondary | ICD-10-CM | POA: Insufficient documentation

## 2019-01-30 DIAGNOSIS — R040 Epistaxis: Secondary | ICD-10-CM | POA: Insufficient documentation

## 2019-01-30 DIAGNOSIS — F1210 Cannabis abuse, uncomplicated: Secondary | ICD-10-CM | POA: Insufficient documentation

## 2019-01-30 DIAGNOSIS — Z87891 Personal history of nicotine dependence: Secondary | ICD-10-CM | POA: Insufficient documentation

## 2019-01-30 DIAGNOSIS — I10 Essential (primary) hypertension: Secondary | ICD-10-CM | POA: Insufficient documentation

## 2019-01-30 MED ORDER — FLUTICASONE PROPIONATE 50 MCG/ACT NA SUSP: 2.0000 | Freq: Every day | NASAL | 0 refills | Status: DC

## 2019-01-30 MED ORDER — OXYMETAZOLINE HCL 0.05 % NA SOLN
2.0000 | Freq: Two times a day (BID) | NASAL | Status: DC | PRN
  Administered 2019-01-30: 2 via NASAL
  Filled 2019-01-30: qty 30

## 2019-01-30 NOTE — ED Triage Notes (Signed)
Pt is c/o sinus problems x 3 days  Pt states he has pressure and that his nose has been bleeding  States his nose bled for about an hour tonight

## 2019-01-30 NOTE — ED Provider Notes (Signed)
[facility] Provider Note: [name], MD, FACEP  CSN: [id number] MRN: [id number] ARRIVAL: 01/30/19 at 0102 ROOM: MH02/MH02   CHIEF COMPLAINT  Sinus Problem   HISTORY OF PRESENT ILLNESS  01/30/19 1:14 AM Andrew Byrd is a 38 y.o. male with 3 days of sinus pressure and nasal congestion.  Yesterday evening he began having nosebleeds, 1 of which lasted about an hour.  It has subsequently abated.  He has been taking Tylenol Sinus but not using any nasal spray.  He denies fever, cough or body aches.    Past Medical History:  Diagnosis Date  . Hypertension     History reviewed. No pertinent surgical history.  Family History  Problem Relation Age of Onset  . Hypertension Other     Social History   Tobacco Use  . Smoking status: Former Games developer  . Smokeless tobacco: Never Used  Substance Use Topics  . Alcohol use: No  . Drug use: Yes    Types: Marijuana    Prior to Admission medications   Medication Sig Start Date End Date Taking? Authorizing Provider  fluticasone (FLONASE) 50 MCG/ACT nasal spray Place 2 sprays into both nostrils daily. 04/26/17   Trixie Dredge, PA-C  guaiFENesin-dextromethorphan (ROBITUSSIN DM) 100-10 MG/5ML syrup Take 5 mLs by mouth every 4 (four) hours as needed for cough (and congestion). 04/26/17   Trixie Dredge, PA-C  ibuprofen (ADVIL,MOTRIN) 800 MG tablet Take 1 tablet (800 mg total) by mouth every 8 (eight) hours as needed. 04/26/17   Trixie Dredge, PA-C  pseudoephedrine (SUDAFED) 60 MG tablet Take 1 tablet (60 mg total) by mouth every 6 (six) hours as needed for congestion. 04/26/17   Trixie Dredge, PA-C    Allergies Other and Penicillins   REVIEW OF SYSTEMS  Negative except as noted here or in the History of Present Illness.   PHYSICAL EXAMINATION  Initial Vital Signs Blood pressure (!) 153/94, pulse 62, temperature 97.7 F (36.5 C), temperature source Oral, resp. rate 16, height 5\' 11"  (1.803 m), weight 136.1 kg, SpO2 97  %.  Examination General: Well-developed, well-nourished male in no acute distress; appearance consistent with age of record HENT: normocephalic; atraumatic; pharynx normal; nasal congestion Eyes: pupils equal, round and reactive to light; extraocular muscles intact Neck: supple Heart: regular rate and rhythm Lungs: clear to auscultation bilaterally Abdomen: soft; nondistended; nontender; bowel sounds present Extremities: No deformity; full range of motion Neurologic: Awake, alert and oriented; motor function intact in all extremities and symmetric; no facial droop Skin: Warm and dry Psychiatric: Normal mood and affect   RESULTS  Summary of this visit's results, reviewed by myself:   EKG Interpretation  Date/Time:    Ventricular Rate:    PR Interval:    QRS Duration:   QT Interval:    QTC Calculation:   R Axis:     Text Interpretation:        Laboratory Studies: No results found for this or any previous visit (from the past 24 hour(s)). Imaging Studies: No results found.  ED COURSE and MDM  Nursing notes and initial vitals signs, including pulse oximetry, reviewed.  Vitals:   01/30/19 0108 01/30/19 0111  BP: (!) 153/94   Pulse: 62   Resp: 16   Temp: 97.7 F (36.5 C)   TempSrc: Oral   SpO2: 97%   Weight:  136.1 kg  Height:  5\' 11"  (1.803 m)   Will treat with a brief course of Afrin which should help with congestion as well as  epistaxis.  He was advised to continue the Tylenol Sinus.  PROCEDURES    ED DIAGNOSES     ICD-10-CM   1. Nasal congestion R09.81   2. Epistaxis R04.0        [name], MD 01/30/19 5465

## 2019-02-17 ENCOUNTER — Ambulatory Visit: Payer: Self-pay | Admitting: Medical

## 2019-07-27 ENCOUNTER — Emergency Department (HOSPITAL_BASED_OUTPATIENT_CLINIC_OR_DEPARTMENT_OTHER)
Admission: EM | Admit: 2019-07-27 | Discharge: 2019-07-27 | Disposition: A | Discharge status: Home / Self Care | Payer: Self-pay | Attending: Emergency Medicine | Admitting: Emergency Medicine

## 2019-07-27 ENCOUNTER — Encounter (HOSPITAL_BASED_OUTPATIENT_CLINIC_OR_DEPARTMENT_OTHER): Payer: Self-pay | Admitting: Emergency Medicine

## 2019-07-27 ENCOUNTER — Other Ambulatory Visit: Payer: Self-pay

## 2019-07-27 DIAGNOSIS — T7840XA Allergy, unspecified, initial encounter: Secondary | ICD-10-CM | POA: Insufficient documentation

## 2019-07-27 DIAGNOSIS — Z79899 Other long term (current) drug therapy: Secondary | ICD-10-CM | POA: Insufficient documentation

## 2019-07-27 DIAGNOSIS — I10 Essential (primary) hypertension: Secondary | ICD-10-CM | POA: Insufficient documentation

## 2019-07-27 DIAGNOSIS — Z87891 Personal history of nicotine dependence: Secondary | ICD-10-CM | POA: Insufficient documentation

## 2019-07-27 DIAGNOSIS — F1210 Cannabis abuse, uncomplicated: Secondary | ICD-10-CM | POA: Insufficient documentation

## 2019-07-27 DIAGNOSIS — R21 Rash and other nonspecific skin eruption: Secondary | ICD-10-CM | POA: Insufficient documentation

## 2019-07-27 MED ORDER — TRIAMCINOLONE ACETONIDE 0.1 % EX CREA: 1.0000 "application " | TOPICAL_CREAM | Freq: Two times a day (BID) | CUTANEOUS | 0 refills | Status: DC

## 2019-07-27 MED ORDER — PREDNISONE 20 MG PO TABS: 40.0000 mg | ORAL_TABLET | Freq: Every day | ORAL | 0 refills | Status: DC

## 2019-07-27 NOTE — Discharge Instructions (Signed)
You can also take benadryl as needed for itching

## 2019-07-27 NOTE — ED Provider Notes (Signed)
[facility] EMERGENCY DEPARTMENT Provider Note   CSN: [id number] Arrival date & time: 07/27/19  1635     History   Chief Complaint Chief Complaint  Patient presents with  . Allergic Reaction    HPI Andrew Byrd is a 38 y.o. male.     The history is provided by the patient.  Allergic Reaction Presenting symptoms: itching and rash   Severity:  Moderate Duration:  1 week Prior allergic episodes:  No prior episodes Context: new detergents/soaps   Context comment:  Started using a new body wash right before rash started Relieved by:  Nothing Worsened by:  Nothing Ineffective treatments:  OTC ointments   Past Medical History:  Diagnosis Date  . Hypertension     There are no active problems to display for this patient.   History reviewed. No pertinent surgical history.      Home Medications    Prior to Admission medications   Medication Sig Start Date End Date Taking? Authorizing Provider  fluticasone (FLONASE) 50 MCG/ACT nasal spray Place 2 sprays into both nostrils daily. 01/30/19   Molpus, John, MD  predniSONE (DELTASONE) 20 MG tablet Take 2 tablets (40 mg total) by mouth daily. 07/27/19   Blanchie Dessert, MD  triamcinolone cream (KENALOG) 0.1 % Apply 1 application topically 2 (two) times daily. 07/27/19   Blanchie Dessert, MD    Family History Family History  Problem Relation Age of Onset  . Hypertension Other     Social History Social History   Tobacco Use  . Smoking status: Former Research scientist (life sciences)  . Smokeless tobacco: Never Used  Substance Use Topics  . Alcohol use: No  . Drug use: Yes    Types: Marijuana     Allergies   Other and Penicillins   Review of Systems Review of Systems  Skin: Positive for itching and rash.  All other systems reviewed and are negative.    Physical Exam Updated Vital Signs BP (!) 152/110 (BP Location: Left Arm)   Pulse 79   Temp 99.4 F (37.4 C) (Oral)   Resp 20   Ht 6' (1.829 m)   Wt (!) 145.2 kg    SpO2 99%   BMI 43.40 kg/m   Physical Exam Vitals signs and nursing note reviewed.  Constitutional:      General: He is not in acute distress.    Appearance: Normal appearance. He is obese.  HENT:     Head: Normocephalic.     Nose: Nose normal.  Cardiovascular:     Rate and Rhythm: Normal rate.     Pulses: Normal pulses.  Pulmonary:     Effort: Pulmonary effort is normal.  Skin:    General: Skin is warm and dry.     Findings: Rash present.     Comments: Excoriated, maculopapular rash present over face, neck, back, chest, arms and abdomen.  It is patchy in nature with no vesicles, pustules or drainage  Neurological:     General: No focal deficit present.     Mental Status: He is alert and oriented to person, place, and time. Mental status is at baseline.  Psychiatric:        Mood and Affect: Mood normal.        Behavior: Behavior normal.        Thought Content: Thought content normal.      ED Treatments / Results  Labs (all labs ordered are listed, but only abnormal results are displayed) Labs Reviewed - No data to display  EKG None  Radiology No results found.  Procedures Procedures (including critical care time)  Medications Ordered in ED Medications - No data to display   Initial Impression / Assessment and Plan / ED Course  I have reviewed the triage vital signs and the nursing notes.  Pertinent labs & imaging results that were available during my care of the patient were reviewed by me and considered in my medical decision making (see chart for details).        Patient presenting today with symptoms most classic for an allergic reaction to a new body wash.  It appears to a flare of his eczema.  Patient has no oral involvement and is in no acute distress.  Will treat with steroids, triamcinolone and Benadryl.  Final Clinical Impressions(s) / ED Diagnoses   Final diagnoses:  Allergic reaction, initial encounter    ED Discharge Orders          Ordered    triamcinolone cream (KENALOG) 0.1 %  2 times daily     07/27/19 1851    predniSONE (DELTASONE) 20 MG tablet  Daily     07/27/19 1851           Gwyneth SproutPlunkett, Hartley Urton, MD 07/27/19 1853

## 2019-07-27 NOTE — ED Triage Notes (Signed)
Pt reports allergic reaction to new body soap. Itching x 1 week.

## 2019-08-04 ENCOUNTER — Encounter (HOSPITAL_BASED_OUTPATIENT_CLINIC_OR_DEPARTMENT_OTHER): Payer: Self-pay | Admitting: *Deleted

## 2019-08-04 ENCOUNTER — Emergency Department (HOSPITAL_BASED_OUTPATIENT_CLINIC_OR_DEPARTMENT_OTHER)
Admission: EM | Admit: 2019-08-04 | Discharge: 2019-08-04 | Disposition: A | Discharge status: Home / Self Care | Payer: Self-pay | Attending: Emergency Medicine | Admitting: Emergency Medicine

## 2019-08-04 ENCOUNTER — Emergency Department (HOSPITAL_BASED_OUTPATIENT_CLINIC_OR_DEPARTMENT_OTHER): Payer: Self-pay

## 2019-08-04 ENCOUNTER — Other Ambulatory Visit: Payer: Self-pay

## 2019-08-04 DIAGNOSIS — R079 Chest pain, unspecified: Secondary | ICD-10-CM | POA: Insufficient documentation

## 2019-08-04 DIAGNOSIS — Z87891 Personal history of nicotine dependence: Secondary | ICD-10-CM | POA: Insufficient documentation

## 2019-08-04 DIAGNOSIS — I10 Essential (primary) hypertension: Secondary | ICD-10-CM | POA: Insufficient documentation

## 2019-08-04 LAB — CBC WITH DIFFERENTIAL/PLATELET
Abs Immature Granulocytes: 0 10*3/uL (ref 0.00–0.07)
Basophils Absolute: 0 10*3/uL (ref 0.0–0.1)
Basophils Relative: 0 %
Eosinophils Absolute: 0 10*3/uL (ref 0.0–0.5)
Eosinophils Relative: 1 %
HCT: 41.6 % (ref 39.0–52.0)
Hemoglobin: 13.2 g/dL (ref 13.0–17.0)
Immature Granulocytes: 0 %
Lymphocytes Relative: 26 %
Lymphs Abs: 1.1 10*3/uL (ref 0.7–4.0)
MCH: 30.6 pg (ref 26.0–34.0)
MCHC: 31.7 g/dL (ref 30.0–36.0)
MCV: 96.5 fL (ref 80.0–100.0)
Monocytes Absolute: 0.6 10*3/uL (ref 0.1–1.0)
Monocytes Relative: 13 %
Neutro Abs: 2.6 10*3/uL (ref 1.7–7.7)
Neutrophils Relative %: 60 %
Platelets: 166 10*3/uL (ref 150–400)
RBC: 4.31 MIL/uL (ref 4.22–5.81)
RDW: 13.2 % (ref 11.5–15.5)
WBC: 4.3 10*3/uL (ref 4.0–10.5)
nRBC: 0 % (ref 0.0–0.2)

## 2019-08-04 LAB — BASIC METABOLIC PANEL
Anion gap: 8 (ref 5–15)
BUN: 13 mg/dL (ref 6–20)
CO2: 24 mmol/L (ref 22–32)
Calcium: 8.8 mg/dL — ABNORMAL LOW (ref 8.9–10.3)
Chloride: 102 mmol/L (ref 98–111)
Creatinine, Ser: 0.74 mg/dL (ref 0.61–1.24)
GFR calc Af Amer: >60 mL/min (ref >60)
GFR calc non Af Amer: >60 mL/min (ref >60)
Glucose, Bld: 105 mg/dL — ABNORMAL HIGH (ref 70–99)
Potassium: 3.4 mmol/L — ABNORMAL LOW (ref 3.5–5.1)
Sodium: 134 mmol/L — ABNORMAL LOW (ref 135–145)

## 2019-08-04 LAB — TROPONIN I (HIGH SENSITIVITY)
Troponin I (High Sensitivity): 7 ng/L (ref <18)
Troponin I (High Sensitivity): 7 ng/L (ref <18)

## 2019-08-04 MED ORDER — KETOROLAC TROMETHAMINE 30 MG/ML IJ SOLN
30.0000 mg | Freq: Once | INTRAMUSCULAR | Status: AC
  Administered 2019-08-04: 30 mg via INTRAVENOUS
  Filled 2019-08-04: qty 1

## 2019-08-04 MED ORDER — IOHEXOL 350 MG/ML SOLN
100.0000 mL | Freq: Once | INTRAVENOUS | Status: AC | PRN
  Administered 2019-08-04: 100 mL via INTRAVENOUS

## 2019-08-04 MED ORDER — LORAZEPAM 1 MG PO TABS
2.0000 mg | ORAL_TABLET | Freq: Once | ORAL | Status: AC
  Administered 2019-08-04: 2 mg via ORAL
  Filled 2019-08-04: qty 2

## 2019-08-04 MED ORDER — MORPHINE SULFATE (PF) 2 MG/ML IV SOLN
2.0000 mg | Freq: Once | INTRAVENOUS | Status: AC
  Administered 2019-08-04: 2 mg via INTRAVENOUS
  Filled 2019-08-04: qty 1

## 2019-08-04 NOTE — ED Provider Notes (Signed)
[facility] Provider Note   CSN: [id number] Arrival date & time: 08/04/19  1426     History   Chief Complaint Chief Complaint  Patient presents with   Chest Pain    HPI Andrew Byrd is a 38 y.o. male with past medical history of hypertension, presenting to the emergency department with complaint of sudden onset of left-sided chest pain described as an aching pain that began while driving prior to arrival.  He states since he has been sitting in the ED his pain has now migrated to his left back, still described as aching.  He states he is feeling anxiety on evaluation.  He has no cardiac history.  No shortness of breath oh does state the pain is worse with deep breathing.  He states he feels somewhat better leaning forward.  He denies cough, fever, abdominal pain, nausea, vomiting, diaphoresis.  He states he smokes about 1 black and mild per day as well as marijuana daily.  He does not take any medications for his hypertension.  No history of DVT/PE, new unilateral leg swelling or pain, exogenous estrogen use, recent surgery or prolonged immobilization.     The history is provided by the patient.    Past Medical History:  Diagnosis Date   Hypertension     There are no active problems to display for this patient.   History reviewed. No pertinent surgical history.      Home Medications    Prior to Admission medications   Medication Sig Start Date End Date Taking? Authorizing Provider  triamcinolone cream (KENALOG) 0.1 % Apply 1 application topically 2 (two) times daily. 07/27/19  Yes Plunkett, Loree Fee, MD  fluticasone (FLONASE) 50 MCG/ACT nasal spray Place 2 sprays into both nostrils daily. 01/30/19   Molpus, John, MD  predniSONE (DELTASONE) 20 MG tablet Take 2 tablets (40 mg total) by mouth daily. 07/27/19   Blanchie Dessert, MD    Family History Family History  Problem Relation Age of Onset   Hypertension Other     Social History Social  History   Tobacco Use   Smoking status: Former Smoker   Smokeless tobacco: Never Used  Substance Use Topics   Alcohol use: No   Drug use: Yes    Types: Marijuana     Allergies   Other and Penicillins   Review of Systems Review of Systems  Constitutional: Negative for diaphoresis and fever.  Respiratory: Negative for cough and shortness of breath.   Cardiovascular: Positive for chest pain and leg swelling (Bilateral, chronic).  Gastrointestinal: Negative for abdominal pain, nausea and vomiting.  Psychiatric/Behavioral: The patient is nervous/anxious.   All other systems reviewed and are negative.    Physical Exam Updated Vital Signs BP (!) 154/111 (BP Location: Right Arm)    Pulse 83    Temp 99.6 F (37.6 C) (Oral)    Resp 18    Ht 5\' 11"  (1.803 m)    Wt (!) 158.8 kg    SpO2 95%    BMI 48.82 kg/m   Physical Exam Vitals signs and nursing note reviewed.  Constitutional:      Appearance: He is well-developed. He is not ill-appearing.     Comments: Patient is tearful and appears anxious.  HENT:     Head: Normocephalic and atraumatic.  Eyes:     Conjunctiva/sclera: Conjunctivae normal.  Cardiovascular:     Rate and Rhythm: Normal rate and regular rhythm.  Pulmonary:     Effort: Pulmonary effort is  normal. No respiratory distress.     Breath sounds: Normal breath sounds.  Chest:     Chest wall: No tenderness.  Abdominal:     General: Bowel sounds are normal.     Palpations: Abdomen is soft.     Tenderness: There is no abdominal tenderness. There is no guarding or rebound.  Musculoskeletal:     Comments: Bilateral lower extremity edema is present.  No calf tenderness or redness.  Intact DP pulses bilaterally  Skin:    General: Skin is warm.  Neurological:     Mental Status: He is alert.  Psychiatric:        Behavior: Behavior normal.      ED Treatments / Results  Labs (all labs ordered are listed, but only abnormal results are displayed) Labs Reviewed    BASIC METABOLIC PANEL - Abnormal; Notable for the following components:      Result Value   Sodium 134 (*)    Potassium 3.4 (*)    Glucose, Bld 105 (*)    Calcium 8.8 (*)    All other components within normal limits  CBC WITH DIFFERENTIAL/PLATELET  TROPONIN I (HIGH SENSITIVITY)  TROPONIN I (HIGH SENSITIVITY)    EKG EKG Interpretation  Date/Time:  Monday August 04 2019 14:42:01 EDT Ventricular Rate:  86 PR Interval:  192 QRS Duration: 110 QT Interval:  388 QTC Calculation: 464 R Axis:   1 Text Interpretation:  Normal sinus rhythm Minimal voltage criteria for LVH, may be normal variant Borderline ECG No old tracing to compare Confirmed by Rolan BuccoBelfi, Melanie [phone]([id number]) on 08/04/2019 6:06:16 PM   Radiology Dg Chest 2 View  Result Date: 08/04/2019 CLINICAL DATA:  Chest pain for the past 30 minutes. EXAM: CHEST - 2 VIEW COMPARISON:  04/26/2017. FINDINGS: A poor inspiration is again demonstrated with a grossly normal sized heart. Mild crowding of the pulmonary vasculature and interstitial markings without significant change. The lungs remain clear. No pleural fluid. Unremarkable bones. IMPRESSION: No acute abnormality. Electronically Signed   By: Beckie SaltsSteven  Reid M.D.   On: 08/04/2019 17:56   Ct Angio Chest/abd/pel For Dissection W And/or W/wo  Result Date: 08/04/2019 CLINICAL DATA:  38 year old male with chest back pain. Concern for aortic dissection. EXAM: CT ANGIOGRAPHY CHEST, ABDOMEN AND PELVIS TECHNIQUE: Multidetector CT imaging through the chest, abdomen and pelvis was performed using the standard protocol during bolus administration of intravenous contrast. Multiplanar reconstructed images and MIPs were obtained and reviewed to evaluate the vascular anatomy. CONTRAST:  100mL OMNIPAQUE IOHEXOL 350 MG/ML SOLN COMPARISON:  Chest radiograph dated 08/04/2019 FINDINGS: CTA CHEST FINDINGS Cardiovascular: There is no cardiomegaly or pericardial effusion. The thoracic aorta is unremarkable. No central  pulmonary artery embolus identified. Evaluation of the pulmonary arteries is limited due to suboptimal opacification and timing of the contrast. Mediastinum/Nodes: There is no hilar or mediastinal adenopathy. Esophagus is grossly unremarkable. There is a 4 mm left thyroid hypodense nodule. This can be further evaluated with ultrasound on a nonemergent basis. No mediastinal fluid collection. Lungs/Pleura: There is a small left pleural effusion. There is an 8 mm ground-glass nodular density in the right upper lobe. Minimal bibasilar dependent atelectasis. No lobar consolidation or pneumothorax. The central airways are patent. Musculoskeletal: No acute osseous pathology. A 3.4 x 1.7 cm hazy nodular density in the subcutaneous soft tissues of the left posterior chest wall (series 4, image 27) is not characterized but may represent an area of scarring. No drainable fluid collection. Correlation with clinical exam recommended. Review of the  MIP images confirms the above findings. CTA ABDOMEN AND PELVIS FINDINGS VASCULAR Aorta: Normal caliber aorta without aneurysm, dissection, vasculitis or significant stenosis. Celiac: Patent without evidence of aneurysm, dissection, vasculitis or significant stenosis. SMA: Patent without evidence of aneurysm, dissection, vasculitis or significant stenosis. Renals: Both renal arteries are patent without evidence of aneurysm, dissection, vasculitis, fibromuscular dysplasia or significant stenosis. IMA: Patent without evidence of aneurysm, dissection, vasculitis or significant stenosis. Inflow: Patent without evidence of aneurysm, dissection, vasculitis or significant stenosis. Veins: No obvious venous abnormality within the limitations of this arterial phase study. Review of the MIP images confirms the above findings. NON-VASCULAR No intra-abdominal free air or free fluid. Hepatobiliary: Fatty liver. No intrahepatic biliary ductal dilatation. The gallbladder is predominantly contracted.  Faint high attenuation along the dependent gallbladder wall may represent small stones versus artifact. No pericholecystic fluid or evidence of acute cholecystitis by CT. Pancreas: Unremarkable. No pancreatic ductal dilatation or surrounding inflammatory changes. Spleen: Normal in size without focal abnormality. Adrenals/Urinary Tract: Adrenal glands are unremarkable. Kidneys are normal, without renal calculi, focal lesion, or hydronephrosis. Bladder is unremarkable. Stomach/Bowel: Stomach is within normal limits. Appendix appears normal. No evidence of bowel wall thickening, distention, or inflammatory changes. Lymphatic: No significant vascular findings are present. No enlarged abdominal or pelvic lymph nodes. Reproductive: The prostate and seminal vesicles are grossly unremarkable. No pelvic mass. Other: None Musculoskeletal: No acute or significant osseous findings. Review of the MIP images confirms the above findings. IMPRESSION: 1. No aortic dissection or aneurysm. 2. Small left pleural effusion. 3. An 8 mm right upper lobe ground-glass nodule of indeterminate etiology, possibly infectious or inflammatory. Initial follow-up with CT at 6-12 months is recommended to confirm persistence. If persistent, repeat CT is recommended every 2 years until 5 years of stability has been established. This recommendation follows the consensus statement: Guidelines for Management of Incidental Pulmonary Nodules Detected on CT Images: From the Fleischner Society 2017; Radiology 2017; 284:228-243. 4. A 3.4 x 1.7 cm hazy nodular density in the subcutaneous soft tissues of the left posterior chest wall, possibly an area of scarring. Correlation with clinical exam recommended. No drainable fluid collection. 5. Fatty liver. 6. A 4 mm left thyroid hypodense nodule. Evaluation with ultrasound on a nonemergent basis recommended. Electronically Signed   By: Elgie CollardArash  Radparvar M.D.   On: 08/04/2019 20:01    Procedures Procedures  (including critical care time)  Medications Ordered in ED Medications  LORazepam (ATIVAN) tablet 2 mg (2 mg Oral Given 08/04/19 1627)  morphine 2 MG/ML injection 2 mg (2 mg Intravenous Given 08/04/19 1947)  iohexol (OMNIPAQUE) 350 MG/ML injection 100 mL (100 mLs Intravenous Contrast Given 08/04/19 1935)  ketorolac (TORADOL) 30 MG/ML injection 30 mg (30 mg Intravenous Given 08/04/19 2129)     Initial Impression / Assessment and Plan / ED Course  I have reviewed the triage vital signs and the nursing notes.  Pertinent labs & imaging results that were available during my care of the patient were reviewed by me and considered in my medical decision making (see chart for details).  Clinical Course as of Aug 03 2210  Mon Aug 04, 2019  1623 Ativan ordered, pt very afraid of needles for IV   [JR]    Clinical Course User Index [JR] Kunio Cummiskey, [place] N, PA-C       Patient with past medical history of hypertension, presenting to the emergency department with complaint of sudden onset of left-sided chest pain that began while driving prior to arrival.  Pain  radiates to his left upper back, is worse with deep inspiration. He is anxious on arrival.  Patient's tobacco use seems to be his only risk factor for blood clot.  Low to moderate heart score.  EKG without acute ischemic changes or findings to suggest a pericarditis.  His chest x-ray is negative.  Pts cardiac work-up was unremarkable with normal initial troponin and unchanged troponin on recheck.  CBC without leukocytosis.  He is anxiety regarding needles was treated evaluation and he is pain was treated with morphine.  Patient discussed with Dr. Fredderick Phenix.  Proceeded with CT imaging of the chest to rule out dissection given patient's description of pain.  CT imaging with incidental findings though normal-appearing aorta, no pericardial effusion, no central pulmonary artery embolus or evidence of acute pulmonary infiltrate to suggest cause of patient's symptoms  today.  I did discuss incidental findings and recommendation to follow-up outpatient not emergently.  Provided low to moderate heart score, do not believe admission is indicated regarding patient's chest pain, it is atypical and unlikely of acute cardiac or pulmonary etiology at this time.  Patient will be discharged with symptomatic management and very strict return precautions.  Recommend over-the-counter medication such as NSAIDs and Tylenol for symptoms.  He is return the emergency department should anything worsen.  Discussed results, findings, treatment and follow up. Patient advised of return precautions. Patient verbalized understanding and agreed with plan.  Final Clinical Impressions(s) / ED Diagnoses   Final diagnoses:  Left-sided chest pain    ED Discharge Orders    None       Mekia Dipinto, [place] N, PA-C 08/04/19 2211    Rolan Bucco, MD 08/04/19 2259

## 2019-08-04 NOTE — Discharge Instructions (Addendum)
Your lab work and imaging today is reassuring.  You had some incidental imaging findings as discussed, that is recommended you follow up nonemergently with a primary care provider for thyroid ultrasound and repeat chest imaging in 6-12 months regarding nonspecific nodule in the upper lobe of your left lung. It is recommended that you take ibuprofen every 6 hours as needed for discomfort. You can take Tylenol additionally as needed for additional pain. Return to the emergency department if your symptoms worsen, or for new or concerning symptoms.

## 2019-08-04 NOTE — ED Triage Notes (Signed)
Chest pain for 30 minutes. He is crying on arrival to triage.

## 2019-08-04 NOTE — ED Notes (Signed)
Pt extremely afraid of needles. Unable to safely attempt IV placement at this time. EDP notified. See new orders.

## 2019-08-08 ENCOUNTER — Encounter (HOSPITAL_BASED_OUTPATIENT_CLINIC_OR_DEPARTMENT_OTHER): Payer: Self-pay | Admitting: *Deleted

## 2019-08-08 ENCOUNTER — Other Ambulatory Visit: Payer: Self-pay

## 2019-08-08 ENCOUNTER — Emergency Department (HOSPITAL_BASED_OUTPATIENT_CLINIC_OR_DEPARTMENT_OTHER)
Admission: EM | Admit: 2019-08-08 | Discharge: 2019-08-08 | Disposition: A | Discharge status: Home / Self Care | Payer: Self-pay | Attending: Emergency Medicine | Admitting: Emergency Medicine

## 2019-08-08 DIAGNOSIS — I10 Essential (primary) hypertension: Secondary | ICD-10-CM | POA: Insufficient documentation

## 2019-08-08 DIAGNOSIS — Z79899 Other long term (current) drug therapy: Secondary | ICD-10-CM | POA: Insufficient documentation

## 2019-08-08 DIAGNOSIS — Z87891 Personal history of nicotine dependence: Secondary | ICD-10-CM | POA: Insufficient documentation

## 2019-08-08 DIAGNOSIS — Z20828 Contact with and (suspected) exposure to other viral communicable diseases: Secondary | ICD-10-CM | POA: Insufficient documentation

## 2019-08-08 DIAGNOSIS — M791 Myalgia, unspecified site: Secondary | ICD-10-CM | POA: Insufficient documentation

## 2019-08-08 LAB — SARS CORONAVIRUS 2 (TAT 6-24 HRS): SARS Coronavirus 2: NEGATIVE

## 2019-08-08 MED ORDER — NAPROXEN 250 MG PO TABS
500.0000 mg | ORAL_TABLET | Freq: Once | ORAL | Status: AC
  Administered 2019-08-08: 500 mg via ORAL
  Filled 2019-08-08: qty 2

## 2019-08-08 MED ORDER — PREDNISONE 10 MG (21) PO TBPK: ORAL_TABLET | ORAL | 0 refills | Status: DC

## 2019-08-08 MED ORDER — AMLODIPINE BESYLATE 5 MG PO TABS: 5.0000 mg | ORAL_TABLET | Freq: Every day | ORAL | 0 refills | Status: DC

## 2019-08-08 MED ORDER — NAPROXEN 500 MG PO TABS: 500.0000 mg | ORAL_TABLET | Freq: Two times a day (BID) | ORAL | 0 refills | Status: DC

## 2019-08-08 MED ORDER — PREDNISONE 50 MG PO TABS
60.0000 mg | ORAL_TABLET | Freq: Once | ORAL | Status: AC
  Administered 2019-08-08: 60 mg via ORAL
  Filled 2019-08-08: qty 1

## 2019-08-08 NOTE — ED Notes (Signed)
Pt on monitor 

## 2019-08-08 NOTE — ED Triage Notes (Addendum)
C/o joint pain  Shoulder, knees, wrist and hands x  Few days   Rt groin pain, denies n/v diarrhea,  No appetite    Taking ibu for pain

## 2019-08-08 NOTE — ED Provider Notes (Signed)
[facility] Provider Note   CSN: [id number] Arrival date & time: 08/08/19  1058     History   Chief Complaint Chief Complaint  Patient presents with  . Joint Pain    HPI Andrew Byrd is a 38 y.o. male.     Pt presents to the ED today with joint pain.  He said it's been going on for a few days.  He takes tylenol and ibuprofen for pain which helps, but the pain comes back.  He was here on 9/7 for cp.  That was gone after he drank a coke and burped.  The pt is worried that he has covid.  The pt denies any known exposures.  No fever or cough.     Past Medical History:  Diagnosis Date  . Hypertension     There are no active problems to display for this patient.   History reviewed. No pertinent surgical history.      Home Medications    Prior to Admission medications   Medication Sig Start Date End Date Taking? Authorizing Provider  amLODipine (NORVASC) 5 MG tablet Take 1 tablet (5 mg total) by mouth daily. 08/08/19   Isla Pence, MD  fluticasone (FLONASE) 50 MCG/ACT nasal spray Place 2 sprays into both nostrils daily. 01/30/19   Molpus, John, MD  naproxen (NAPROSYN) 500 MG tablet Take 1 tablet (500 mg total) by mouth 2 (two) times daily. 08/08/19   Isla Pence, MD  predniSONE (STERAPRED UNI-PAK 21 TAB) 10 MG (21) TBPK tablet Take 6 tabs for 2 days, then 5 for 2 days, then 4 for 2 days, then 3 for 2 days, 2 for 2 days, then 1 for 2 days 08/08/19   Isla Pence, MD  triamcinolone cream (KENALOG) 0.1 % Apply 1 application topically 2 (two) times daily. 07/27/19   Blanchie Dessert, MD    Family History Family History  Problem Relation Age of Onset  . Hypertension Other     Social History Social History   Tobacco Use  . Smoking status: Former Research scientist (life sciences)  . Smokeless tobacco: Never Used  Substance Use Topics  . Alcohol use: No  . Drug use: Yes    Types: Marijuana     Allergies   Other and Penicillins   Review of Systems  Review of Systems  Musculoskeletal: Positive for myalgias.  All other systems reviewed and are negative.    Physical Exam Updated Vital Signs BP (!) 144/105 (BP Location: Right Arm)   Pulse 74   Resp 16   SpO2 98%   Physical Exam Vitals signs and nursing note reviewed.  Constitutional:      Appearance: Normal appearance.  HENT:     Head: Normocephalic and atraumatic.     Right Ear: External ear normal.     Left Ear: External ear normal.     Nose: Nose normal.     Mouth/Throat:     Mouth: Mucous membranes are moist.     Pharynx: Oropharynx is clear.  Eyes:     Extraocular Movements: Extraocular movements intact.     Conjunctiva/sclera: Conjunctivae normal.     Pupils: Pupils are equal, round, and reactive to light.  Neck:     Musculoskeletal: Normal range of motion and neck supple.  Cardiovascular:     Rate and Rhythm: Normal rate and regular rhythm.     Pulses: Normal pulses.     Heart sounds: Normal heart sounds.  Pulmonary:     Effort: Pulmonary effort is  normal.     Breath sounds: Normal breath sounds.  Abdominal:     General: Abdomen is flat. Bowel sounds are normal.  Musculoskeletal: Normal range of motion.  Skin:    General: Skin is warm.     Capillary Refill: Capillary refill takes less than 2 seconds.  Neurological:     General: No focal deficit present.     Mental Status: He is alert and oriented to person, place, and time.  Psychiatric:        Mood and Affect: Mood normal.        Behavior: Behavior normal.      ED Treatments / Results  Labs (all labs ordered are listed, but only abnormal results are displayed) Labs Reviewed  SARS CORONAVIRUS 2 (TAT 6-24 HRS)    EKG EKG Interpretation  Date/Time:  Friday August 08 2019 11:27:18 EDT Ventricular Rate:  71 PR Interval:    QRS Duration: 95 QT Interval:  389 QTC Calculation: 423 R Axis:   8 Text Interpretation:  Sinus rhythm Left ventricular hypertrophy No significant change since last  tracing Confirmed by Jacalyn LefevreHaviland, Merikay Lesniewski [phone]) on 08/08/2019 11:36:14 AM   Radiology No results found.  Procedures Procedures (including critical care time)  Medications Ordered in ED Medications  predniSONE (DELTASONE) tablet 60 mg (60 mg Oral Given 08/08/19 1222)  naproxen (NAPROSYN) tablet 500 mg (500 mg Oral Given 08/08/19 1223)     Initial Impression / Assessment and Plan / ED Course  I have reviewed the triage vital signs and the nursing notes.  Pertinent labs & imaging results that were available during my care of the patient were reviewed by me and considered in my medical decision making (see chart for details).        Pt will be started on prednisone and naprosyn for his sx.  He is swabbed for covid.  Self-isolate until results come back.  The pt also has a hx of htn.  He has not been on any meds for 2 years.  He will be started on norvasc.  He is encouraged to exercise and to eat a low salt diet and to try to establish care with a pcp.  [facility] was evaluated in [facility] on 08/08/2019 for the symptoms described in the history of present illness. He was evaluated in the context of the global COVID-19 pandemic, which necessitated consideration that the patient might be at risk for infection with the SARS-CoV-2 virus that causes COVID-19. Institutional protocols and algorithms that pertain to the evaluation of patients at risk for COVID-19 are in a state of rapid change based on information released by regulatory bodies including the CDC and federal and state organizations. These policies and algorithms were followed during the patient's care in the ED.  Final Clinical Impressions(s) / ED Diagnoses   Final diagnoses:  Myalgia  Essential hypertension    ED Discharge Orders         Ordered    amLODipine (NORVASC) 5 MG tablet  Daily     08/08/19 1213    predniSONE (STERAPRED UNI-PAK 21 TAB) 10 MG (21) TBPK tablet     08/08/19 1213    naproxen (NAPROSYN) 500  MG tablet  2 times daily     08/08/19 1213           Jacalyn LefevreHaviland, Koray Soter, MD 08/08/19 1252

## 2019-08-21 ENCOUNTER — Encounter (HOSPITAL_BASED_OUTPATIENT_CLINIC_OR_DEPARTMENT_OTHER): Payer: Self-pay | Admitting: *Deleted

## 2019-08-21 ENCOUNTER — Emergency Department (HOSPITAL_BASED_OUTPATIENT_CLINIC_OR_DEPARTMENT_OTHER)
Admission: EM | Admit: 2019-08-21 | Discharge: 2019-08-21 | Disposition: A | Discharge status: Home / Self Care | Payer: Self-pay | Attending: Emergency Medicine | Admitting: Emergency Medicine

## 2019-08-21 ENCOUNTER — Other Ambulatory Visit: Payer: Self-pay

## 2019-08-21 DIAGNOSIS — R5383 Other fatigue: Secondary | ICD-10-CM | POA: Insufficient documentation

## 2019-08-21 DIAGNOSIS — Z87891 Personal history of nicotine dependence: Secondary | ICD-10-CM | POA: Insufficient documentation

## 2019-08-21 DIAGNOSIS — I10 Essential (primary) hypertension: Secondary | ICD-10-CM | POA: Insufficient documentation

## 2019-08-21 DIAGNOSIS — Z79899 Other long term (current) drug therapy: Secondary | ICD-10-CM | POA: Insufficient documentation

## 2019-08-21 MED ORDER — AZITHROMYCIN 250 MG PO TABS
1000.0000 mg | ORAL_TABLET | Freq: Once | ORAL | Status: AC
  Administered 2019-08-21: 17:00:00 1000 mg via ORAL
  Filled 2019-08-21: qty 4

## 2019-08-21 MED ORDER — CEFTRIAXONE SODIUM 250 MG IJ SOLR
250.0000 mg | Freq: Once | INTRAMUSCULAR | Status: DC
  Filled 2019-08-21: qty 250

## 2019-08-21 MED ORDER — DOXYCYCLINE HYCLATE 50 MG PO CAPS: 100.0000 mg | ORAL_CAPSULE | Freq: Two times a day (BID) | ORAL | 0 refills | Status: AC

## 2019-08-21 NOTE — ED Triage Notes (Signed)
Pt seen here 9/11 for same, c/o generalized fatigue, finished prednisone w/o improvement

## 2019-08-21 NOTE — ED Provider Notes (Signed)
[facility] Provider Note   CSN: [id number] Arrival date & time: 08/21/19  1535     History   Chief Complaint Chief Complaint  Patient presents with  . Fatigue    HPI Andrew Byrd is a 38 y.o. male with PMHx of HTN presenting to ED with complaints of diffuse joint pain and swelling, fatigue and sore throat for few weeks duration. He was previously evaluated for this and underwent extensive work up. He was last seen on 9/11 for similar complaints and given prednisone and naproxen. He reports improvement with the prednisone but since running out, he reports return of symptoms. Patient denies any fever, chills, chest pain, headache, dizziness, shortness of breath, or palpitations.    Past Medical History:  Diagnosis Date  . Hypertension       Home Medications    Prior to Admission medications   Medication Sig Start Date End Date Taking? Authorizing Provider  amLODipine (NORVASC) 5 MG tablet Take 1 tablet (5 mg total) by mouth daily. 08/08/19   Isla Pence, MD  doxycycline (VIBRAMYCIN) 50 MG capsule Take 2 capsules (100 mg total) by mouth 2 (two) times daily for 10 days. 08/21/19 08/31/19  Harvie Heck, MD  fluticasone (FLONASE) 50 MCG/ACT nasal spray Place 2 sprays into both nostrils daily. 01/30/19   Molpus, John, MD  naproxen (NAPROSYN) 500 MG tablet Take 1 tablet (500 mg total) by mouth 2 (two) times daily. 08/08/19   Isla Pence, MD  predniSONE (STERAPRED UNI-PAK 21 TAB) 10 MG (21) TBPK tablet Take 6 tabs for 2 days, then 5 for 2 days, then 4 for 2 days, then 3 for 2 days, 2 for 2 days, then 1 for 2 days 08/08/19   Isla Pence, MD  triamcinolone cream (KENALOG) 0.1 % Apply 1 application topically 2 (two) times daily. 07/27/19   Blanchie Dessert, MD    Family History Family History  Problem Relation Age of Onset  . Hypertension Other     Social History Social History   Tobacco Use  . Smoking status: Former Research scientist (life sciences)  . Smokeless  tobacco: Never Used  Substance Use Topics  . Alcohol use: No  . Drug use: Yes    Types: Marijuana     Allergies   Other and Penicillins   Review of Systems Review of Systems  Constitutional: Negative for activity change, appetite change, chills, fatigue and fever.  HENT: Positive for sore throat. Negative for postnasal drip, sinus pressure, sinus pain, trouble swallowing and voice change.   Eyes: Negative for pain and visual disturbance.  Respiratory: Negative for cough, chest tightness, shortness of breath and wheezing.   Cardiovascular: Negative for chest pain and palpitations.  Gastrointestinal: Negative for abdominal pain.  Endocrine: Negative.   Genitourinary: Negative.   Musculoskeletal: Positive for arthralgias and joint swelling. Negative for gait problem, myalgias, neck pain and neck stiffness.  Skin: Negative.   Allergic/Immunologic: Negative.   Neurological: Positive for numbness. Negative for dizziness, syncope, speech difficulty, weakness, light-headedness and headaches.  Hematological: Negative.      Physical Exam Updated Vital Signs BP (!) 142/88 (BP Location: Right Arm)   Pulse 84   Temp 98.3 F (36.8 C) (Oral)   Resp 18   Ht 5\' 11"  (1.803 m)   Wt (!) 158 kg   SpO2 99%   BMI 48.58 kg/m   Physical Exam Constitutional:      General: He is not in acute distress.    Appearance: Normal appearance. He is normal  weight. He is not toxic-appearing.  HENT:     Mouth/Throat:     Mouth: Mucous membranes are moist.     Pharynx: Oropharynx is clear. No oropharyngeal exudate or posterior oropharyngeal erythema.  Eyes:     General: No scleral icterus.    Extraocular Movements: Extraocular movements intact.     Conjunctiva/sclera: Conjunctivae normal.     Pupils: Pupils are equal, round, and reactive to light.  Neck:     Musculoskeletal: Normal range of motion and neck supple. No muscular tenderness.  Cardiovascular:     Rate and Rhythm: Normal rate and  regular rhythm.     Pulses: Normal pulses.     Heart sounds: Normal heart sounds.  Pulmonary:     Effort: Pulmonary effort is normal. No respiratory distress.     Breath sounds: No wheezing or rales.  Abdominal:     General: Bowel sounds are normal. There is no distension.     Palpations: Abdomen is soft.     Tenderness: There is no abdominal tenderness.  Musculoskeletal:        General: Swelling and tenderness present. No deformity.     Comments: ROM of bilateral wrists, right shoulder and right hip limited due to pain   Skin:    General: Skin is warm and dry.     Capillary Refill: Capillary refill takes less than 2 seconds.  Neurological:     General: No focal deficit present.     Mental Status: He is alert and oriented to person, place, and time. Mental status is at baseline.     Sensory: No sensory deficit.     Motor: No weakness.     Coordination: Coordination normal.     Gait: Gait normal.     ED Treatments / Results  Labs (all labs ordered are listed, but only abnormal results are displayed) Labs Reviewed  RPR  HIV ANTIBODY (ROUTINE TESTING W REFLEX)  GC/CHLAMYDIA PROBE AMP ([place]) NOT AT [facility]    EKG None  Radiology No results found.  Procedures Procedures (including critical care time)  Medications Ordered in ED Medications  cefTRIAXone (ROCEPHIN) injection 250 mg (250 mg Intramuscular Refused 08/21/19 1727)  azithromycin (ZITHROMAX) tablet 1,000 mg (1,000 mg Oral Given 08/21/19 1726)     Initial Impression / Assessment and Plan / ED Course  I have reviewed the triage vital signs and the nursing notes.  Pertinent labs & imaging results that were available during my care of the patient were reviewed by me and considered in my medical decision making (see chart for details).  Patient is a 38yo male presenting to the ED with diffuse joint pain and swelling, fatigue and sore throat for several weeks duration. He was previously evaluated in the ED for  this and after extensive workup, was discharged to home with PCP follow up. He was also given trial of prednisone and naproxen at last visit and patient reports that it improved his symptoms but he continues to experience painful joints, especially in his bilateral hands and wrists and bilateral hips after completion of the prednisone.   On examination, there is no tenderness to palpation of his PIP, DIP or wrists or shoulder or bilateral hips or knees. However, passive and active ROM is restricted due to pain. There is swelling noted of bilateral hands; however nonspecific to PIP and DIP. No obvious deformity or injury noted. No obvious skin findings noted on examination. No sensory deficits or focal weakness on neurological examination noted.  Ddx: rheumatologic arthropathies, Lyme disease, HIV, syphilis, GC/Chlamydia   Patient refused workup for HIV, syphilis or GC/Chlamydia. Will give doxycycline empirically in case of Lyme disease and GC/Chlamydia. Patient instructed to follow up with PCP and strict return precautions provided.    Final Clinical Impressions(s) / ED Diagnoses   Final diagnoses:  Fatigue, unspecified type    ED Discharge Orders         Ordered    doxycycline (VIBRAMYCIN) 50 MG capsule  2 times daily     08/21/19 1835           Eliezer BottomAslam, Zharia Conrow, MD 08/21/19 2017    Melene PlanFloyd, Dan, DO 08/21/19 2119

## 2019-08-21 NOTE — ED Notes (Signed)
ED Provider at bedside. 

## 2019-08-21 NOTE — ED Notes (Signed)
PT states lips still swollen. No obvious swelling noted. Airway intact. No angio edema.

## 2019-08-21 NOTE — ED Notes (Signed)
PT refusing blood draw and IM medication.

## 2019-08-21 NOTE — Progress Notes (Signed)
TOC CM chart reviewed and pt had 4 ED visits in past six months with no insurance or PCP noted. Provided pt with information on [facility]. [place], [place] ED TOC CM [phone]

## 2019-08-21 NOTE — Discharge Instructions (Signed)
Andrew Byrd,  You presented to the ED with fatigue and generalized joint pains. This seems chronic in nature and you would benefit from PCP evaluation. Please take doxycycline as prescribed for 10 days to cover for any infectious etiology of your cause.  If you have any worsening of symptoms, develop a fever, chest pain or shortness of breath, please seek emergent care.

## 2019-08-23 LAB — URINE CYTOLOGY ANCILLARY ONLY
Chlamydia: NEGATIVE
Neisseria Gonorrhea: NEGATIVE

## 2019-11-24 ENCOUNTER — Inpatient Hospital Stay (HOSPITAL_COMMUNITY)
Admission: EM | Admit: 2019-11-24 | Discharge: 2019-12-02 | DRG: 808 | Disposition: A | Discharge status: Home Health | Payer: Medicaid Other | Attending: Internal Medicine | Admitting: Internal Medicine

## 2019-11-24 ENCOUNTER — Encounter (HOSPITAL_COMMUNITY): Payer: Self-pay | Admitting: Emergency Medicine

## 2019-11-24 ENCOUNTER — Other Ambulatory Visit: Payer: Self-pay

## 2019-11-24 ENCOUNTER — Emergency Department (HOSPITAL_COMMUNITY): Payer: Medicaid Other

## 2019-11-24 DIAGNOSIS — L309 Dermatitis, unspecified: Secondary | ICD-10-CM | POA: Diagnosis present

## 2019-11-24 DIAGNOSIS — Z79899 Other long term (current) drug therapy: Secondary | ICD-10-CM

## 2019-11-24 DIAGNOSIS — L659 Nonscarring hair loss, unspecified: Secondary | ICD-10-CM | POA: Diagnosis present

## 2019-11-24 DIAGNOSIS — K469 Unspecified abdominal hernia without obstruction or gangrene: Secondary | ICD-10-CM | POA: Diagnosis present

## 2019-11-24 DIAGNOSIS — M2550 Pain in unspecified joint: Secondary | ICD-10-CM | POA: Diagnosis present

## 2019-11-24 DIAGNOSIS — Z653 Problems related to other legal circumstances: Secondary | ICD-10-CM

## 2019-11-24 DIAGNOSIS — D638 Anemia in other chronic diseases classified elsewhere: Secondary | ICD-10-CM | POA: Diagnosis present

## 2019-11-24 DIAGNOSIS — R531 Weakness: Secondary | ICD-10-CM

## 2019-11-24 DIAGNOSIS — E46 Unspecified protein-calorie malnutrition: Secondary | ICD-10-CM | POA: Diagnosis present

## 2019-11-24 DIAGNOSIS — R6881 Early satiety: Secondary | ICD-10-CM

## 2019-11-24 DIAGNOSIS — I10 Essential (primary) hypertension: Secondary | ICD-10-CM | POA: Diagnosis present

## 2019-11-24 DIAGNOSIS — R630 Anorexia: Secondary | ICD-10-CM

## 2019-11-24 DIAGNOSIS — Z560 Unemployment, unspecified: Secondary | ICD-10-CM

## 2019-11-24 DIAGNOSIS — K760 Fatty (change of) liver, not elsewhere classified: Secondary | ICD-10-CM | POA: Diagnosis present

## 2019-11-24 DIAGNOSIS — R634 Abnormal weight loss: Secondary | ICD-10-CM

## 2019-11-24 DIAGNOSIS — E876 Hypokalemia: Secondary | ICD-10-CM | POA: Diagnosis not present

## 2019-11-24 DIAGNOSIS — Z8249 Family history of ischemic heart disease and other diseases of the circulatory system: Secondary | ICD-10-CM

## 2019-11-24 DIAGNOSIS — D649 Anemia, unspecified: Secondary | ICD-10-CM

## 2019-11-24 DIAGNOSIS — Z87891 Personal history of nicotine dependence: Secondary | ICD-10-CM

## 2019-11-24 DIAGNOSIS — Z20822 Contact with and (suspected) exposure to covid-19: Secondary | ICD-10-CM | POA: Diagnosis present

## 2019-11-24 DIAGNOSIS — Z6831 Body mass index (BMI) 31.0-31.9, adult: Secondary | ICD-10-CM

## 2019-11-24 DIAGNOSIS — D61818 Other pancytopenia: Principal | ICD-10-CM

## 2019-11-24 DIAGNOSIS — R5381 Other malaise: Secondary | ICD-10-CM

## 2019-11-24 DIAGNOSIS — Z880 Allergy status to penicillin: Secondary | ICD-10-CM

## 2019-11-24 DIAGNOSIS — E43 Unspecified severe protein-calorie malnutrition: Secondary | ICD-10-CM | POA: Diagnosis present

## 2019-11-24 DIAGNOSIS — K8020 Calculus of gallbladder without cholecystitis without obstruction: Secondary | ICD-10-CM | POA: Diagnosis present

## 2019-11-24 DIAGNOSIS — I951 Orthostatic hypotension: Secondary | ICD-10-CM

## 2019-11-24 LAB — BASIC METABOLIC PANEL
Anion gap: 12 (ref 5–15)
BUN: 9 mg/dL (ref 6–20)
CO2: 24 mmol/L (ref 22–32)
Calcium: 8.9 mg/dL (ref 8.9–10.3)
Chloride: 100 mmol/L (ref 98–111)
Creatinine, Ser: 0.9 mg/dL (ref 0.61–1.24)
GFR calc Af Amer: >60 mL/min (ref >60)
GFR calc non Af Amer: >60 mL/min (ref >60)
Glucose, Bld: 112 mg/dL — ABNORMAL HIGH (ref 70–99)
Potassium: 3.9 mmol/L (ref 3.5–5.1)
Sodium: 136 mmol/L (ref 135–145)

## 2019-11-24 LAB — CBC
HCT: 40.1 % (ref 39.0–52.0)
Hemoglobin: 12.3 g/dL — ABNORMAL LOW (ref 13.0–17.0)
MCH: 28.3 pg (ref 26.0–34.0)
MCHC: 30.7 g/dL (ref 30.0–36.0)
MCV: 92.2 fL (ref 80.0–100.0)
Platelets: 160 10*3/uL (ref 150–400)
RBC: 4.35 MIL/uL (ref 4.22–5.81)
RDW: 16.3 % — ABNORMAL HIGH (ref 11.5–15.5)
WBC: 3 10*3/uL — ABNORMAL LOW (ref 4.0–10.5)
nRBC: 0 % (ref 0.0–0.2)

## 2019-11-24 MED ORDER — ALBUTEROL SULFATE (2.5 MG/3ML) 0.083% IN NEBU: 5.0000 mg | INHALATION_SOLUTION | Freq: Once | RESPIRATORY_TRACT | Status: DC

## 2019-11-24 NOTE — ED Notes (Signed)
[place]  OLYMPIA  [phone] I[facility]WT

## 2019-11-24 NOTE — ED Triage Notes (Signed)
Pt arrives to ED from home with complaints of shortness of breath for the past month. Patient states he came in today because he has lost a lot of weight recently and has no appetite.

## 2019-11-25 ENCOUNTER — Emergency Department (HOSPITAL_COMMUNITY): Payer: Medicaid Other

## 2019-11-25 DIAGNOSIS — K8020 Calculus of gallbladder without cholecystitis without obstruction: Secondary | ICD-10-CM | POA: Diagnosis present

## 2019-11-25 DIAGNOSIS — K469 Unspecified abdominal hernia without obstruction or gangrene: Secondary | ICD-10-CM | POA: Diagnosis present

## 2019-11-25 DIAGNOSIS — R630 Anorexia: Secondary | ICD-10-CM

## 2019-11-25 DIAGNOSIS — E43 Unspecified severe protein-calorie malnutrition: Secondary | ICD-10-CM | POA: Diagnosis present

## 2019-11-25 DIAGNOSIS — D61818 Other pancytopenia: Secondary | ICD-10-CM | POA: Diagnosis present

## 2019-11-25 DIAGNOSIS — R5383 Other fatigue: Secondary | ICD-10-CM

## 2019-11-25 DIAGNOSIS — Z20822 Contact with and (suspected) exposure to covid-19: Secondary | ICD-10-CM | POA: Diagnosis present

## 2019-11-25 DIAGNOSIS — L659 Nonscarring hair loss, unspecified: Secondary | ICD-10-CM | POA: Diagnosis present

## 2019-11-25 DIAGNOSIS — I951 Orthostatic hypotension: Secondary | ICD-10-CM | POA: Diagnosis present

## 2019-11-25 DIAGNOSIS — E876 Hypokalemia: Secondary | ICD-10-CM | POA: Diagnosis not present

## 2019-11-25 DIAGNOSIS — D72819 Decreased white blood cell count, unspecified: Secondary | ICD-10-CM

## 2019-11-25 DIAGNOSIS — M2550 Pain in unspecified joint: Secondary | ICD-10-CM

## 2019-11-25 DIAGNOSIS — L309 Dermatitis, unspecified: Secondary | ICD-10-CM

## 2019-11-25 DIAGNOSIS — K760 Fatty (change of) liver, not elsewhere classified: Secondary | ICD-10-CM | POA: Diagnosis present

## 2019-11-25 DIAGNOSIS — Z8249 Family history of ischemic heart disease and other diseases of the circulatory system: Secondary | ICD-10-CM | POA: Diagnosis not present

## 2019-11-25 DIAGNOSIS — R6881 Early satiety: Secondary | ICD-10-CM

## 2019-11-25 DIAGNOSIS — I10 Essential (primary) hypertension: Secondary | ICD-10-CM | POA: Diagnosis present

## 2019-11-25 DIAGNOSIS — R531 Weakness: Secondary | ICD-10-CM | POA: Insufficient documentation

## 2019-11-25 DIAGNOSIS — Z87891 Personal history of nicotine dependence: Secondary | ICD-10-CM

## 2019-11-25 DIAGNOSIS — D649 Anemia, unspecified: Secondary | ICD-10-CM

## 2019-11-25 DIAGNOSIS — Z653 Problems related to other legal circumstances: Secondary | ICD-10-CM | POA: Diagnosis not present

## 2019-11-25 DIAGNOSIS — E46 Unspecified protein-calorie malnutrition: Secondary | ICD-10-CM

## 2019-11-25 DIAGNOSIS — R5381 Other malaise: Secondary | ICD-10-CM

## 2019-11-25 DIAGNOSIS — Z560 Unemployment, unspecified: Secondary | ICD-10-CM | POA: Diagnosis not present

## 2019-11-25 DIAGNOSIS — Z6831 Body mass index (BMI) 31.0-31.9, adult: Secondary | ICD-10-CM | POA: Diagnosis not present

## 2019-11-25 DIAGNOSIS — D638 Anemia in other chronic diseases classified elsewhere: Secondary | ICD-10-CM | POA: Diagnosis present

## 2019-11-25 DIAGNOSIS — Z79899 Other long term (current) drug therapy: Secondary | ICD-10-CM | POA: Diagnosis not present

## 2019-11-25 DIAGNOSIS — R0602 Shortness of breath: Secondary | ICD-10-CM | POA: Diagnosis present

## 2019-11-25 DIAGNOSIS — Z880 Allergy status to penicillin: Secondary | ICD-10-CM | POA: Diagnosis not present

## 2019-11-25 DIAGNOSIS — R634 Abnormal weight loss: Secondary | ICD-10-CM

## 2019-11-25 LAB — TSH: TSH: 2.188 u[IU]/mL (ref 0.350–4.500)

## 2019-11-25 LAB — HEPATIC FUNCTION PANEL
ALT: 56 U/L — ABNORMAL HIGH (ref 0–44)
AST: 59 U/L — ABNORMAL HIGH (ref 15–41)
Albumin: 2.7 g/dL — ABNORMAL LOW (ref 3.5–5.0)
Alkaline Phosphatase: 81 U/L (ref 38–126)
Bilirubin, Direct: 0.4 mg/dL — ABNORMAL HIGH (ref 0.0–0.2)
Indirect Bilirubin: 1 mg/dL — ABNORMAL HIGH (ref 0.3–0.9)
Total Bilirubin: 1.4 mg/dL — ABNORMAL HIGH (ref 0.3–1.2)
Total Protein: 6.6 g/dL (ref 6.5–8.1)

## 2019-11-25 LAB — HIV ANTIBODY (ROUTINE TESTING W REFLEX): HIV Screen 4th Generation wRfx: NONREACTIVE

## 2019-11-25 LAB — C-REACTIVE PROTEIN: CRP: 0.9 mg/dL (ref <1.0)

## 2019-11-25 LAB — HEPATITIS C ANTIBODY: HCV Ab: NONREACTIVE

## 2019-11-25 LAB — T4, FREE: Free T4: 1.23 ng/dL — ABNORMAL HIGH (ref 0.61–1.12)

## 2019-11-25 LAB — SEDIMENTATION RATE: Sed Rate: 57 mm/hr — ABNORMAL HIGH (ref 0–16)

## 2019-11-25 LAB — HEPATITIS B SURFACE ANTIGEN: Hepatitis B Surface Ag: NONREACTIVE

## 2019-11-25 LAB — HEPATITIS B CORE ANTIBODY, TOTAL: Hep B Core Total Ab: NONREACTIVE

## 2019-11-25 LAB — SARS CORONAVIRUS 2 (TAT 6-24 HRS): SARS Coronavirus 2: NEGATIVE

## 2019-11-25 MED ORDER — ALUM & MAG HYDROXIDE-SIMETH 200-200-20 MG/5ML PO SUSP
30.0000 mL | Freq: Once | ORAL | Status: AC
  Administered 2019-11-25: 07:00:00 30 mL via ORAL
  Filled 2019-11-25: qty 30

## 2019-11-25 MED ORDER — ENSURE ENLIVE PO LIQD
237.0000 mL | Freq: Two times a day (BID) | ORAL | Status: DC
  Administered 2019-11-26: 14:00:00 237 mL via ORAL
  Filled 2019-11-25 (×2): qty 237

## 2019-11-25 MED ORDER — DIPHENHYDRAMINE HCL 25 MG PO CAPS
25.0000 mg | ORAL_CAPSULE | Freq: Every evening | ORAL | Status: AC | PRN
  Administered 2019-11-25: 22:00:00 25 mg via ORAL
  Filled 2019-11-25: qty 1

## 2019-11-25 MED ORDER — ACETAMINOPHEN 325 MG PO TABS
650.0000 mg | ORAL_TABLET | Freq: Four times a day (QID) | ORAL | Status: DC | PRN
  Administered 2019-11-30 – 2019-12-01 (×3): 650 mg via ORAL
  Filled 2019-11-25 (×3): qty 2

## 2019-11-25 MED ORDER — ADULT MULTIVITAMIN W/MINERALS CH
1.0000 | ORAL_TABLET | Freq: Every day | ORAL | Status: DC
  Administered 2019-11-25 – 2019-12-02 (×7): 1 via ORAL
  Filled 2019-11-25 (×8): qty 1

## 2019-11-25 MED ORDER — SODIUM CHLORIDE 0.9% FLUSH
3.0000 mL | Freq: Two times a day (BID) | INTRAVENOUS | Status: DC
  Administered 2019-11-25 – 2019-12-02 (×12): 3 mL via INTRAVENOUS

## 2019-11-25 MED ORDER — TRIAMCINOLONE ACETONIDE 0.1 % EX CREA
1.0000 "application " | TOPICAL_CREAM | Freq: Two times a day (BID) | CUTANEOUS | Status: DC
  Administered 2019-11-25 – 2019-12-02 (×14): 1 via TOPICAL
  Filled 2019-11-25 (×2): qty 15

## 2019-11-25 MED ORDER — SODIUM CHLORIDE 0.9 % IV BOLUS
1000.0000 mL | Freq: Once | INTRAVENOUS | Status: AC
  Administered 2019-11-25: 05:00:00 1000 mL via INTRAVENOUS

## 2019-11-25 MED ORDER — ACETAMINOPHEN 650 MG RE SUPP: 650.0000 mg | Freq: Four times a day (QID) | RECTAL | Status: DC | PRN

## 2019-11-25 MED ORDER — IOHEXOL 350 MG/ML SOLN
100.0000 mL | Freq: Once | INTRAVENOUS | Status: AC | PRN
  Administered 2019-11-25: 05:00:00 100 mL via INTRAVENOUS

## 2019-11-25 MED ORDER — ENOXAPARIN SODIUM 40 MG/0.4ML ~~LOC~~ SOLN
40.0000 mg | SUBCUTANEOUS | Status: DC
  Filled 2019-11-25 (×4): qty 0.4

## 2019-11-25 MED ORDER — SODIUM CHLORIDE 0.9 % IV BOLUS
1000.0000 mL | Freq: Once | INTRAVENOUS | Status: AC
  Administered 2019-11-25: 14:00:00 1000 mL via INTRAVENOUS

## 2019-11-25 NOTE — H&P (Signed)
Date: 11/25/2019               Patient Name:  Andrew Byrd MRN: [id number]  DOB: 04/19/1981 Age / Sex: 38 y.o., male   PCP: Patient, No Pcp Per         Medical Service: Internal Medicine Teaching Service         Attending Physician: Dr. Sid Falcon, MD    First Contact: Dr. Sheppard Coil Pager: [phone]  Second Contact: Dr. Eileen Stanford Pager: [phone]       After Hours (After 5p/  First Contact Pager: [phone]  weekends / holidays): Second Contact Pager: [phone]   Chief Complaint: Weight loss, decreased appetite, fatigue  History of Present Illness: Mr Andrew Byrd is a 38 yo M with Hx of HTN presenting with 3 months of decreased decreased appetite, fatigue, and weight loss. He starting having symptoms 3 months ago, in September. He initially noticed a few days of facial swelling that resolved spontaneously. He then began to experience a slow worsening of his appetite also starting in September. This led to gradual development and worsening of fatigue in September-October, continuing to now. He noticed joint pain, hair loss and skin changes (dry, eczematous skin) in October-November. He was seen his skin, hair, and joint changes and was treated with prednisone in Spetember, which resolved his joint pain; Kenalog, which improved his skin (but he has since run out). His state his skin changes and hair loss have stabilized.   In the Past month, he has continue to have decreased appetite with early satiety, fatigue, weight loss (~40lbs), and decreased strength. He reports some dyspnea on exertion, but no SOB at rest. He also reports some stress in his life (prior to onset of symptoms) due to being out of work, having 6 kids, and dealing with court issues. He denies chest pain, Abd Pain, Vomiting, Sick contact, Recent travel, Constipation, or Diarrhea.   He was seen for chest pain in the ED on 08/04/2019. EKG, CXR, Troponin, and CTA were negative at that time and his was discharged with recommendation to  take OTC pain relievers PRN.  He was seen for Joint Pain, and HTN in the ED on 08/08/2019. He was discharged with Amlodipine for HTN and Prednisone and Naproxen for his joint pain (which improved his pain).  He was seen for Fatigue and recurrent joint pain in the ED on 08/21/2019. He was treated empirically with Ceftriaxone and Azithromycin, then discharged with doxycycline as he refused HIV, Syphilis, or GC Chlamydia work up.  He established with a PCP on 08/27/2019. His PHQ-2 was 1 and he was further worked up with HIV, RPR, ANA, RA, Lyme Ab which were all negative. Iron studies showed low-normal Fe with elevated Ferritin.  He was seen in the ED on 09/01/2019 for Nausea and Weakness. CTA C/A/P showed on mild left basilar atelectasis and small L pleural effusion. A/P WNL. He was given IVF and discharged with Decadron and Zofran.  On 09/17/2019 he was seen in the ED for his ongoing symptoms and epigastric pain and nausea. He had elevated ESR and CRP, mild LFT elevation, Mild leukopenia, Mild Normocytic anemia, Persisted LLL atelectasis on CXR. He was suspected to have viral syndrome versus chronic inflammation, no additional testing was performed. He was advised to follow up with PCP.  After a tele-health visit with PCP on 10/01/2019 for his ongoing joint pain, Nausea, anorexia, and fatigue he was advised to go to the ED.   On 11/03/2019, he  presented to the ED for abdominal pain, but left before his treatment was complete. Mild Leukopenia, Anemia, and LFT elevations persisted.  ED course: Lab work up showed normal BMP, Mild LFT elevations, and mild leukopenia. EKG showed Sinus Rhythm with some TWI in III, aVF, and V6. CTA C/A/P showed Sub-q cyst in chest wall, Steatosis, chololiths without inflammation, and a small hernia. He was given 2L IVF and Maalox. IMTS was asked to admit for Malnutrition, Deconditioning, and further workup.  Meds: No current facility-administered medications on file prior to encounter.    Current Outpatient Medications on File Prior to Encounter  Medication Sig  . amLODipine (NORVASC) 5 MG tablet Take 1 tablet (5 mg total) by mouth daily. (Patient not taking: Reported on 11/25/2019)  . fluticasone (FLONASE) 50 MCG/ACT nasal spray Place 2 sprays into both nostrils daily. (Patient not taking: Reported on 11/25/2019)  . naproxen (NAPROSYN) 500 MG tablet Take 1 tablet (500 mg total) by mouth 2 (two) times daily. (Patient not taking: Reported on 11/25/2019)  . predniSONE (STERAPRED UNI-PAK 21 TAB) 10 MG (21) TBPK tablet Take 6 tabs for 2 days, then 5 for 2 days, then 4 for 2 days, then 3 for 2 days, 2 for 2 days, then 1 for 2 days (Patient not taking: Reported on 11/25/2019)  . triamcinolone cream (KENALOG) 0.1 % Apply 1 application topically 2 (two) times daily. (Patient not taking: Reported on 11/25/2019)   Allergies: Allergies as of 11/24/2019 - Review Complete 11/24/2019  Allergen Reaction Noted  . Other  10/07/2015  . Penicillins  11/21/2012   Past Medical History:  Diagnosis Date  . Hypertension     Family History:  Family History  Problem Relation Age of Onset  . Hypertension Other   Reviewed on admission  Social History:  Social History   Tobacco Use  . Smoking status: Former Research scientist (life sciences)  . Smokeless tobacco: Never Used  Substance Use Topics  . Alcohol use: No  . Drug use: Yes    Types: Marijuana  Reviewed on admission  Review of Systems: A complete ROS was negative except as per HPI.  Physical Exam: Blood pressure 116/87, pulse 93, temperature 97.6 F (36.4 C), temperature source Oral, resp. rate (!) 22, SpO2 100 %. Physical Exam Constitutional:      General: He is not in acute distress.    Appearance: Normal appearance.  HENT:     Head: Normocephalic and atraumatic.     Mouth/Throat:     Mouth: Mucous membranes are moist.  Cardiovascular:     Rate and Rhythm: Normal rate and regular rhythm.     Pulses: Normal pulses.     Heart sounds: Normal  heart sounds.  Pulmonary:     Effort: Pulmonary effort is normal. No respiratory distress.     Breath sounds: Normal breath sounds.  Abdominal:     General: Bowel sounds are normal. There is no distension.     Palpations: Abdomen is soft.     Tenderness: There is no abdominal tenderness.  Musculoskeletal:        General: No swelling or deformity.  Skin:    General: Skin is warm and dry.     Findings: Rash present.     Comments: Scattered dry skin (eczematous in appearance) on chest, arms, and abdomen.  Neurological:     General: No focal deficit present.     Comments: Patient is awake, alert, oriented x3 Cranial Nerves: II: Pupils equal, round, and reactive to light.  III,IV, VI:  EOMI without ptosis or diploplia.  V: Facial sensation is symmetric tolight touch VII: Facial movement is symmetric.  VIII: hearing is intact to voice X: Uvula elevates symmetrically XI: Shoulder shrug is symmetric. XII: tongue is midline without atrophy or fasciculations.  Motor: Decreased effort thorughout, at Least 5/5 bilateral UE, 4/5 bilateral lower extremitiy Sensory: Sensation is grossly intact bilateral UEs & LEs     EKG: personally reviewed my interpretation is Sinus Rhythm, TWI in III, aVF, V6  CTA Chest, A/P: IMPRESSION: 1. No pulmonary embolus or acute intrathoracic abnormality. 2. The right upper lobe 8 mm ground-glass nodular opacity and left pleural effusion on chest CT 3 months ago have resolved. 3. Subcutaneous density in the left posterior chest wall is unchanged from prior, likely focal area of scarring or sebaceous cyst, but nonspecific in CT appearance.  Assessment & Plan by Problem: Principal Problem:   Protein calorie malnutrition (HCC) Active Problems:   Physical deconditioning   Early satiety   Leukopenia   Normocytic anemia  Anorexia Protein Calorie Malnutrition Deconditioning: Patient has had several months of deceased appetite and early setiety leading to  decreased PO intake and weight loss. This has contributed to his ongoing fatigue, weakness, and deconditioning. He has been spending most of his time in bed for the past month or so. He denies depression or anxiety, but does endorse stress related to him being out of work, having to go to court, and having 6 children. He does appear to be making the effort to eat, but does not have a significant appetite. He is able to consume fluid okay, but can become full after a bottle of water. He eats about 1 meal a day (he was able to eat a hotdog the day prior to admission) and he eats 2-3 fruits a day (mostly oranges and bananas). Albumin 2.7 today. Etiology is unclear at this time.  - Nutrition Consult - PT Eval and Treat - OT Eval and Treat - Supplemental Protein Shakes - Multi-VItamin  Fatigue Normocytic Anemia Leukopenia Joint Pain: Chronic fatigue with episodes of joint pain (relieved by steroids). Some of his fatigue is related to his ongoing anorexia, weight loss, and deconditioning. Given his joint pain swelling there is concern for possible inflammatory or viral process. ANA, RF, HIV, and RPR previously negative. TSH, T4 elevated in the ED without significant abnormality. ESR and CRP previously elevated as well as Ferritin. Concern for Acute HIV in Window Period will recheck and check viral load. Will also repeat ANA. If initial work-up negative patient may need Bone marrow biopsy to evaluate for Myelodysplastic Syndrome. - Repeat ANA, HIV - CRP, ESR - CMV - Hepatitis B panel - Hepatitis C  Rash: Likely eczematous, could be related to systemic inflammatory disease or a component of acute viral process, has had improvement with triamcinolone, but has run out. - Triamcinlone  HTN: Reported history of hypertension. On medication previously but has since run out. BP at goal in ED. Will monitor for now.  FEN: Regular Diet, Supplemental Shakes VTE ppx: Lovenox Code Status: FULL  Dispo: Admit  patient to Inpatient with expected length of stay greater than 2 midnights.  Signed: Neva Seat, MD 11/25/2019, 1:49 PM  Pager: [phone]

## 2019-11-25 NOTE — ED Notes (Signed)
ED TO INPATIENT HANDOFF REPORT  ED Nurse Name and Phone #: Alycia RossettiRyan, [phone]  S Name/Age/Gender Andrew Byrd 38 y.o. male Room/Bed: 008C/008C  Code Status   Code Status: Full Code  Home/SNF/Other Home Patient oriented to: self, place, time and situation Is this baseline? Yes   Triage Complete: Triage complete  Chief Complaint Protein calorie malnutrition (HCC) [E46]  Triage Note Pt arrives to ED from home with complaints of shortness of breath for the past month. Patient states he came in today because he has lost a lot of weight recently and has no appetite.     Allergies Allergies  Allergen Reactions  . Other     Nut Allergy  . Penicillins     Level of Care/Admitting Diagnosis ED Disposition    ED Disposition Condition Comment   Admit  [facility]ospital [facility]: [facility] [[id number]]  Level of Care: Med-Surg [16]  Covid Evaluation: Asymptomatic Screening Protocol (No Symptoms)  Diagnosis: Protein calorie malnutrition [facility](HCC) [[id number]][[id number]]  Admitting Physician: Nena PolioMULLEN, EMILY B [[id number]]  Attending Physician: Nena PolioMULLEN, EMILY B [[id number]]  Estimated length of stay: past midnight tomorrow  Certification:: I certify this patient will need inpatient services for at least 2 midnights       B Medical/Surgery History Past Medical History:  Diagnosis Date  . Hypertension    History reviewed. No pertinent surgical history.   A IV Location/Drains/Wounds Patient Lines/Drains/Airways Status   Active Line/Drains/Airways    Name:   Placement date:   Placement time:   Site:   Days:   Peripheral IV 11/25/19 Right Antecubital   11/25/19    0450    Antecubital   less than 1          Intake/Output Last 24 hours No intake or output data in the 24 hours ending 11/25/19 1553  Labs/Imaging Results for orders placed or performed during the hospital encounter of 11/24/19 (from the past 48 hour(s))  Basic metabolic panel     Status: Abnormal   Collection Time: 11/24/19  5:02 PM   Result Value Ref Range   Sodium 136 135 - 145 mmol/L   Potassium 3.9 3.5 - 5.1 mmol/L   Chloride 100 98 - 111 mmol/L   CO2 24 22 - 32 mmol/L   Glucose, Bld 112 (H) 70 - 99 mg/dL   BUN 9 6 - 20 mg/dL   Creatinine, Ser 1.190.90 0.61 - 1.24 mg/dL   Calcium 8.9 8.9 - 14.710.3 mg/dL   GFR calc non Af Amer >60 >60 mL/min   GFR calc Af Amer >60 >60 mL/min   Anion gap 12 5 - 15    Comment: Performed at [facility], [address] [place], [place] [address]  CBC     Status: Abnormal   Collection Time: 11/24/19  5:02 PM  Result Value Ref Range   WBC 3.0 (L) 4.0 - 10.5 K/uL   RBC 4.35 4.22 - 5.81 MIL/uL   Hemoglobin 12.3 (L) 13.0 - 17.0 g/dL   HCT 21.340.1 08.639.0 - 57.852.0 %   MCV 92.2 80.0 - 100.0 fL   MCH 28.3 26.0 - 34.0 pg   MCHC 30.7 30.0 - 36.0 g/dL   RDW 46.916.3 (H) 62.911.5 - 52.815.5 %   Platelets 160 150 - 400 K/uL    Comment: REPEATED TO VERIFY   nRBC 0.0 0.0 - 0.2 %    Comment: Performed at [facility], [address] [place], [place] [address]  TSH  Status: None   Collection Time: 11/25/19  4:22 AM  Result Value Ref Range   TSH 2.188 0.350 - 4.500 uIU/mL    Comment: Performed by a 3rd Generation assay with a functional sensitivity of <=0.01 uIU/mL. Performed at [facility], [place] [address] [place], [place] [address]   T4, free     Status: Abnormal   Collection Time: 11/25/19  4:22 AM  Result Value Ref Range   Free T4 1.23 (H) 0.61 - 1.12 ng/dL    Comment: (NOTE) Biotin ingestion may interfere with free T4 tests. If the results are inconsistent with the TSH level, previous test results, or the clinical presentation, then consider biotin interference. If needed, order repeat testing after stopping biotin. Performed at [facility], [place] [address], [place], [place] [address]   Hepatic function panel     Status: Abnormal   Collection Time: 11/25/19  4:22 AM  Result Value Ref Range   Total Protein 6.6 6.5 - 8.1 g/dL   Albumin 2.7 (L) 3.5 - 5.0 g/dL   AST 59 (H)  15 - 41 U/L   ALT 56 (H) 0 - 44 U/L   Alkaline Phosphatase 81 38 - 126 U/L   Total Bilirubin 1.4 (H) 0.3 - 1.2 mg/dL   Bilirubin, Direct 0.4 (H) 0.0 - 0.2 mg/dL   Indirect Bilirubin 1.0 (H) 0.3 - 0.9 mg/dL    Comment: Performed at [facility] [place] [place] [address] [place], [place] [address]  SARS CORONAVIRUS 2 (TAT 6-24 HRS) Nasopharyngeal Nasopharyngeal Swab     Status: None   Collection Time: 11/25/19 10:32 AM   Specimen: Nasopharyngeal Swab  Result Value Ref Range   SARS Coronavirus 2 NEGATIVE NEGATIVE    Comment: (NOTE) SARS-CoV-2 target nucleic acids are NOT DETECTED. The SARS-CoV-2 RNA is generally detectable in upper and lower respiratory specimens during the acute phase of infection. Negative results do not preclude SARS-CoV-2 infection, do not rule out co-infections with other pathogens, and should not be used as the sole basis for treatment or other patient management decisions. Negative results must be combined with clinical observations, patient history, and epidemiological information. The expected result is Negative. Fact Sheet for Patients: SugarRoll.be Fact Sheet for Healthcare Providers: https://www.woods-mathews.com/ This test is not yet approved or cleared by the [place] FDA and  has been authorized for detection and/or diagnosis of SARS-CoV-2 by FDA under an Emergency Use Authorization (EUA). This EUA will remain  in effect (meaning this test can be used) for the duration of the COVID-19 declaration under Section 56 4(b)(1) of the Act, 21 U.S.C. section 360bbb-3(b)(1), unless the authorization is terminated or revoked sooner. Performed at [facility], [place] [address], [place], [place] [address]    DG Chest 2 View  Result Date: 11/24/2019 CLINICAL DATA:  Shortness of breath. EXAM: CHEST - 2 VIEW COMPARISON:  CT chest/abdomen/pelvis 08/04/2019, chest radiograph 08/04/2019 FINDINGS: Heart size within normal  limits. Chronic elevation of the left hemidiaphragm. No airspace consolidation. No evidence of pleural effusion or pneumothorax. No acute bony abnormality. Nonspecific air distention of the colon within the partially imaged upper abdomen. IMPRESSION: No evidence of acute cardiopulmonary abnormality. Nonspecific air distention of the colon within the partially imaged upper abdomen. Electronically Signed   By: Kellie Simmering DO   On: 11/24/2019 17:20   CT Angio Chest PE W and/or Wo Contrast  Result Date: 11/25/2019 CLINICAL DATA:  Shortness of breath for 1 month. Recent weight loss. Decreased  appetite. EXAM: CT ANGIOGRAPHY CHEST WITH CONTRAST TECHNIQUE: Multidetector CT imaging of the chest was performed using the standard protocol during bolus administration of intravenous contrast. Multiplanar CT image reconstructions and MIPs were obtained to evaluate the vascular anatomy. CONTRAST:  OMNIPAQUE IOHEXOL 350 MG/ML SOLN COMPARISON:  Chest CTA 08/04/2019. Performed in conjunction with CT of the abdomen/pelvis, reported separately. FINDINGS: Cardiovascular: There are no filling defects within the pulmonary arteries to suggest pulmonary embolus. The thoracic aorta is normal in caliber. Heart is normal in size. No pericardial effusion. Mediastinum/Nodes: No enlarged mediastinal or hilar lymph nodes. Subcentimeter hypodense left thyroid nodules only partially included in the field of view. No esophageal wall thickening. Lungs/Pleura: Previous right upper lobe ground-glass opacity has resolved. No new airspace disease. Subsegmental linear atelectasis in the lingula. Previous left pleural effusion has resolved. No pulmonary edema. Trachea and mainstem bronchi are patent. Upper Abdomen: Assessed on dedicated abdominopelvic CT, performed concurrently and reported separately. Musculoskeletal: There are no acute or suspicious osseous abnormalities. Nodular density in the subcutaneous tissues of the left posterior chest  wall, series 5, image 52, is not significantly changed from prior. Review of the MIP images confirms the above findings. IMPRESSION: 1. No pulmonary embolus or acute intrathoracic abnormality. 2. The right upper lobe 8 mm ground-glass nodular opacity and left pleural effusion on chest CT 3 months ago have resolved. 3. Subcutaneous density in the left posterior chest wall is unchanged from prior, likely focal area of scarring or sebaceous cyst, but nonspecific in CT appearance. Electronically Signed   By: Narda Rutherford M.D.   On: 11/25/2019 05:31   CT ABDOMEN PELVIS W CONTRAST  Result Date: 11/25/2019 CLINICAL DATA:  Abdominal pain, unspecified. Shortness of breath for 1 month. Recent weight loss with no appetite. EXAM: CT ABDOMEN AND PELVIS WITH CONTRAST TECHNIQUE: Multidetector CT imaging of the abdomen and pelvis was performed using the standard protocol following bolus administration of intravenous contrast. CONTRAST:  OMNIPAQUE IOHEXOL 350 MG/ML SOLN COMPARISON:  Performed in conjunction with chest CTA, reported separately. Chest, abdomen, and pelvis CTA 08/04/2019 FINDINGS: Lower chest: Assessed on concurrent chest CT, reported separately. Hepatobiliary: Diffusely decreased hepatic density consistent with steatosis. Intraluminal gallstones without pericholecystic inflammation. No biliary dilatation. Pancreas: No ductal dilatation or inflammation. Spleen: Normal in size without focal abnormality. Adrenals/Urinary Tract: Normal adrenal glands. No hydronephrosis or perinephric edema. Homogeneous renal enhancement with symmetric excretion on delayed phase imaging. Urinary bladder is physiologically distended without wall thickening. Stomach/Bowel: Bowel evaluation is limited in the absence of enteric contrast. Stomach is nondistended. No gastric wall thickening. No small bowel inflammation or obstruction. Normal appendix. Small volume of stool in the ascending colon. Slight gaseous distension of  transverse colon. Descending and sigmoid colon are nondistended. No evidence of colonic wall thickening or colonic mass on CT. Vascular/Lymphatic: Normal caliber abdominal aorta. Portal vein is patent. No adenopathy. Reproductive: Prostate is unremarkable. Other: Small fat containing periumbilical hernia. No ascites or free air. No evidence of intra-abdominal mass. Musculoskeletal: There are no acute or suspicious osseous abnormalities. IMPRESSION: 1. No acute abnormality or explanation for abdominal pain or weight loss. 2. Hepatic steatosis. Cholelithiasis without gallbladder inflammation. 3. Small fat containing periumbilical hernia. Electronically Signed   By: Narda Rutherford M.D.   On: 11/25/2019 05:39    Pending Labs Unresulted Labs (From admission, onward)    Start     Ordered   12/02/19 0500  Creatinine, serum  (enoxaparin (LOVENOX)    CrCl >/= 30 ml/min)  Weekly,  R    Comments: while on enoxaparin therapy    11/25/19 0922   11/26/19 0500  Comprehensive metabolic panel  Tomorrow morning,   R     11/25/19 0922   11/26/19 0500  CBC with Differential/Platelet  Tomorrow morning,   R     11/25/19 0922   11/25/19 1349  HIV-1 RNA quant-no reflex-bld  Once,   STAT     11/25/19 1349   11/25/19 1345  Cmv antibody, IgG (EIA)  Once,   STAT     11/25/19 1349   11/25/19 1344  Hepatitis B surface antibody  Once,   STAT     11/25/19 1349   11/25/19 1344  Hepatitis B surface antigen  Once,   STAT     11/25/19 1349   11/25/19 1344  Hepatitis C antibody  Once,   STAT     11/25/19 1349   11/25/19 1344  Hepatitis B core antibody, total  Once,   STAT     11/25/19 1349   11/25/19 1341  HIV Antibody (routine testing w rflx)  (HIV Antibody (Routine testing w reflex) panel)  Once,   STAT     11/25/19 1349   11/25/19 1341  ANA w/Reflex if Positive  Once,   STAT     11/25/19 1349   11/25/19 1341  Sedimentation rate  Once,   STAT     11/25/19 1349   11/25/19 1341  C-reactive protein  Once,   STAT      11/25/19 1349          Vitals/Pain Today's Vitals   11/25/19 0630 11/25/19 0645 11/25/19 1427 11/25/19 1427  BP:    119/82  Pulse: 91 93  99  Resp: 18 (!) 22  (!) 24  Temp:      TempSrc:      SpO2: 99% 100%  100%  PainSc:   0-No pain     Isolation Precautions No active isolations  Medications Medications  enoxaparin (LOVENOX) injection 40 mg (40 mg Subcutaneous Not Given 11/25/19 1424)  sodium chloride flush (NS) 0.9 % injection 3 mL (3 mLs Intravenous Not Given 11/25/19 1424)  acetaminophen (TYLENOL) tablet 650 mg (has no administration in time range)    Or  acetaminophen (TYLENOL) suppository 650 mg (has no administration in time range)  multivitamin with minerals tablet 1 tablet (1 tablet Oral Given 11/25/19 1425)  feeding supplement (ENSURE ENLIVE) (ENSURE ENLIVE) liquid 237 mL (237 mLs Oral Not Given 11/25/19 1431)  triamcinolone cream (KENALOG) 0.1 % 1 application (1 application Topical Given 11/25/19 1431)  sodium chloride 0.9 % bolus 1,000 mL (1,000 mLs Intravenous New Bag/Given 11/25/19 0450)  iohexol (OMNIPAQUE) 350 MG/ML injection 100 mL (100 mLs Intravenous Contrast Given 11/25/19 0512)  alum & mag hydroxide-simeth (MAALOX/MYLANTA) 200-200-20 MG/5ML suspension 30 mL (30 mLs Oral Given 11/25/19 0645)  sodium chloride 0.9 % bolus 1,000 mL (1,000 mLs Intravenous New Bag/Given 11/25/19 1423)    Mobility walks Low fall risk   Focused Assessments Pulmonary Assessment Handoff:  Lung sounds:   O2 Device: Room Air        R Recommendations: See Admitting Provider Note  Report given to:   Additional Notes:

## 2019-11-25 NOTE — ED Provider Notes (Signed)
[facility] EMERGENCY DEPARTMENT Provider Note  CSN: [id number] Arrival date & time: 11/24/19 1617  Chief Complaint(s) Shortness of Breath  HPI Andrew Byrd is a 38 y.o. male who presents to the emergency department with several months of generalized fatigue, decreased oral intake and 40 pound weight loss.  Patient reports that he was evaluated for joint pain which was thought to be either a tickborne disease or viral process.  He was treated with steroids and doxycycline.  Symptoms have persisted.  He has been evaluated multiple times and has had extensive work-up including negative HIV, RPR, ANA and RA.  Patient denies any chest pain or shortness of breath.  No abdominal pain.  He denies any emesis.  Endorses decreased appetite which resolved some decreased oral intake.  He denies any known sick contacts, recent travel.  Patient has been faithful with 1 partner for over 7 years.  Patient is also noticed significant dry skin and hair loss with rash.  Patient did report that he quit smoking marijuana 4 months ago.  He denies any other illicit drug use.  Reports social drinking on the weekends.  HPI  Past Medical History Past Medical History:  Diagnosis Date  . Hypertension    There are no problems to display for this patient.  Home Medication(s) Prior to Admission medications   Medication Sig Start Date End Date Taking? Authorizing Provider  amLODipine (NORVASC) 5 MG tablet Take 1 tablet (5 mg total) by mouth daily. Patient not taking: Reported on 11/25/2019 08/08/19   Isla Pence, MD  fluticasone [facility]) 50 MCG/ACT nasal spray Place 2 sprays into both nostrils daily. Patient not taking: Reported on 11/25/2019 01/30/19   Molpus, Jenny Reichmann, MD  naproxen (NAPROSYN) 500 MG tablet Take 1 tablet (500 mg total) by mouth 2 (two) times daily. Patient not taking: Reported on 11/25/2019 08/08/19   Isla Pence, MD  predniSONE (STERAPRED UNI-PAK 21 TAB) 10 MG (21) TBPK tablet  Take 6 tabs for 2 days, then 5 for 2 days, then 4 for 2 days, then 3 for 2 days, 2 for 2 days, then 1 for 2 days Patient not taking: Reported on 11/25/2019 08/08/19   Isla Pence, MD  triamcinolone cream (KENALOG) 0.1 % Apply 1 application topically 2 (two) times daily. Patient not taking: Reported on 11/25/2019 07/27/19   Blanchie Dessert, MD                                                                                                                                    Past Surgical History History reviewed. No pertinent surgical history. Family History Family History  Problem Relation Age of Onset  . Hypertension Other     Social History Social History   Tobacco Use  . Smoking status: Former Research scientist (life sciences)  . Smokeless tobacco: Never Used  Substance Use Topics  . Alcohol use: No  . Drug use: Yes    Types: Marijuana  Allergies Other and Penicillins  Review of Systems Review of Systems All other systems are reviewed and are negative for acute change except as noted in the HPI  Physical Exam Vital Signs  I have reviewed the triage vital signs BP 116/87   Pulse 93   Temp 97.6 F (36.4 C) (Oral)   Resp (!) 22   SpO2 100%   Physical Exam Vitals reviewed.  Constitutional:      General: He is not in acute distress.    Appearance: He is well-developed. He is not diaphoretic.  HENT:     Head: Normocephalic and atraumatic.     Nose: Nose normal.  Eyes:     General: No scleral icterus.       Right eye: No discharge.        Left eye: No discharge.     Conjunctiva/sclera: Conjunctivae normal.     Pupils: Pupils are equal, round, and reactive to light.  Cardiovascular:     Rate and Rhythm: Normal rate and regular rhythm.     Heart sounds: No murmur. No friction rub. No gallop.   Pulmonary:     Effort: Pulmonary effort is normal. No respiratory distress.     Breath sounds: Normal breath sounds. No stridor. No rales.  Abdominal:     General: There is no distension.      Palpations: Abdomen is soft.     Tenderness: There is no abdominal tenderness.  Musculoskeletal:        General: No tenderness.     Cervical back: Normal range of motion and neck supple.  Skin:    General: Skin is warm.     Findings: No erythema or rash.     Comments: Alopecia Extremely dry skin   Neurological:     Mental Status: He is alert and oriented to person, place, and time.     ED Results and Treatments Labs (all labs ordered are listed, but only abnormal results are displayed) Labs Reviewed  BASIC METABOLIC PANEL - Abnormal; Notable for the following components:      Result Value   Glucose, Bld 112 (*)    All other components within normal limits  CBC - Abnormal; Notable for the following components:   WBC 3.0 (*)    Hemoglobin 12.3 (*)    RDW 16.3 (*)    All other components within normal limits  T4, FREE - Abnormal; Notable for the following components:   Free T4 1.23 (*)    All other components within normal limits  HEPATIC FUNCTION PANEL - Abnormal; Notable for the following components:   Albumin 2.7 (*)    AST 59 (*)    ALT 56 (*)    Total Bilirubin 1.4 (*)    Bilirubin, Direct 0.4 (*)    Indirect Bilirubin 1.0 (*)    All other components within normal limits  SARS CORONAVIRUS 2 (TAT 6-24 HRS)  TSH  EKG  EKG Interpretation  Date/Time:  Monday November 24 2019 16:37:41 EST Ventricular Rate:  135 PR Interval:  152 QRS Duration: 90 QT Interval:  274 QTC Calculation: 411 R Axis:   16 Text Interpretation: Sinus tachycardia Minimal voltage criteria for LVH, may be normal variant ( R in aVL ) T wave abnormality, consider inferior ischemia Abnormal ECG faster rate than previously Confirmed by Marily Memos [phone]) on 11/24/2019 11:22:34 PM      Radiology DG Chest 2 View  Result Date: 11/24/2019 CLINICAL DATA:  Shortness of breath.  EXAM: CHEST - 2 VIEW COMPARISON:  CT chest/abdomen/pelvis 08/04/2019, chest radiograph 08/04/2019 FINDINGS: Heart size within normal limits. Chronic elevation of the left hemidiaphragm. No airspace consolidation. No evidence of pleural effusion or pneumothorax. No acute bony abnormality. Nonspecific air distention of the colon within the partially imaged upper abdomen. IMPRESSION: No evidence of acute cardiopulmonary abnormality. Nonspecific air distention of the colon within the partially imaged upper abdomen. Electronically Signed   By: Jackey Loge DO   On: 11/24/2019 17:20   CT Angio Chest PE W and/or Wo Contrast  Result Date: 11/25/2019 CLINICAL DATA:  Shortness of breath for 1 month. Recent weight loss. Decreased appetite. EXAM: CT ANGIOGRAPHY CHEST WITH CONTRAST TECHNIQUE: Multidetector CT imaging of the chest was performed using the standard protocol during bolus administration of intravenous contrast. Multiplanar CT image reconstructions and MIPs were obtained to evaluate the vascular anatomy. CONTRAST:  OMNIPAQUE IOHEXOL 350 MG/ML SOLN COMPARISON:  Chest CTA 08/04/2019. Performed in conjunction with CT of the abdomen/pelvis, reported separately. FINDINGS: Cardiovascular: There are no filling defects within the pulmonary arteries to suggest pulmonary embolus. The thoracic aorta is normal in caliber. Heart is normal in size. No pericardial effusion. Mediastinum/Nodes: No enlarged mediastinal or hilar lymph nodes. Subcentimeter hypodense left thyroid nodules only partially included in the field of view. No esophageal wall thickening. Lungs/Pleura: Previous right upper lobe ground-glass opacity has resolved. No new airspace disease. Subsegmental linear atelectasis in the lingula. Previous left pleural effusion has resolved. No pulmonary edema. Trachea and mainstem bronchi are patent. Upper Abdomen: Assessed on dedicated abdominopelvic CT, performed concurrently and reported separately.  Musculoskeletal: There are no acute or suspicious osseous abnormalities. Nodular density in the subcutaneous tissues of the left posterior chest wall, series 5, image 52, is not significantly changed from prior. Review of the MIP images confirms the above findings. IMPRESSION: 1. No pulmonary embolus or acute intrathoracic abnormality. 2. The right upper lobe 8 mm ground-glass nodular opacity and left pleural effusion on chest CT 3 months ago have resolved. 3. Subcutaneous density in the left posterior chest wall is unchanged from prior, likely focal area of scarring or sebaceous cyst, but nonspecific in CT appearance. Electronically Signed   By: Narda Rutherford M.D.   On: 11/25/2019 05:31   CT ABDOMEN PELVIS W CONTRAST  Result Date: 11/25/2019 CLINICAL DATA:  Abdominal pain, unspecified. Shortness of breath for 1 month. Recent weight loss with no appetite. EXAM: CT ABDOMEN AND PELVIS WITH CONTRAST TECHNIQUE: Multidetector CT imaging of the abdomen and pelvis was performed using the standard protocol following bolus administration of intravenous contrast. CONTRAST:  OMNIPAQUE IOHEXOL 350 MG/ML SOLN COMPARISON:  Performed in conjunction with chest CTA, reported separately. Chest, abdomen, and pelvis CTA 08/04/2019 FINDINGS: Lower chest: Assessed on concurrent chest CT, reported separately. Hepatobiliary: Diffusely decreased hepatic density consistent with steatosis. Intraluminal gallstones without pericholecystic inflammation. No biliary dilatation. Pancreas: No ductal dilatation or inflammation. Spleen: Normal in size without  focal abnormality. Adrenals/Urinary Tract: Normal adrenal glands. No hydronephrosis or perinephric edema. Homogeneous renal enhancement with symmetric excretion on delayed phase imaging. Urinary bladder is physiologically distended without wall thickening. Stomach/Bowel: Bowel evaluation is limited in the absence of enteric contrast. Stomach is nondistended. No gastric wall  thickening. No small bowel inflammation or obstruction. Normal appendix. Small volume of stool in the ascending colon. Slight gaseous distension of transverse colon. Descending and sigmoid colon are nondistended. No evidence of colonic wall thickening or colonic mass on CT. Vascular/Lymphatic: Normal caliber abdominal aorta. Portal vein is patent. No adenopathy. Reproductive: Prostate is unremarkable. Other: Small fat containing periumbilical hernia. No ascites or free air. No evidence of intra-abdominal mass. Musculoskeletal: There are no acute or suspicious osseous abnormalities. IMPRESSION: 1. No acute abnormality or explanation for abdominal pain or weight loss. 2. Hepatic steatosis. Cholelithiasis without gallbladder inflammation. 3. Small fat containing periumbilical hernia. Electronically Signed   By: Narda Rutherford M.D.   On: 11/25/2019 05:39    Pertinent labs & imaging results that were available during my care of the patient were reviewed by me and considered in my medical decision making (see chart for details).  Medications Ordered in ED Medications  albuterol (PROVENTIL) (2.5 MG/3ML) 0.083% nebulizer solution 5 mg (has no administration in time range)  sodium chloride 0.9 % bolus 1,000 mL (has no administration in time range)  sodium chloride 0.9 % bolus 1,000 mL (1,000 mLs Intravenous New Bag/Given 11/25/19 0450)  iohexol (OMNIPAQUE) 350 MG/ML injection 100 mL (100 mLs Intravenous Contrast Given 11/25/19 0512)  alum & mag hydroxide-simeth (MAALOX/MYLANTA) 200-200-20 MG/5ML suspension 30 mL (30 mLs Oral Given 11/25/19 0645)                                                                                                                                    Procedures Procedures  (including critical care time)  Medical Decision Making / ED Course I have reviewed the nursing notes for this encounter and the patient's prior records (if available in EHR or on provided paperwork).   Kelen Laura was evaluated in Emergency Department on 11/25/2019 for the symptoms described in the history of present illness. He was evaluated in the context of the global COVID-19 pandemic, which necessitated consideration that the patient might be at risk for infection with the SARS-CoV-2 virus that causes COVID-19. Institutional protocols and algorithms that pertain to the evaluation of patients at risk for COVID-19 are in a state of rapid change based on information released by regulatory bodies including the CDC and federal and state organizations. These policies and algorithms were followed during the patient's care in the ED.  On review of records patient has had negative work-up for the above.  Given the weight loss, alopecia and dry skin will assess thyroid panel.  We will also obtain CT of the chest abdomen pelvis to assess for any neoplastic process.  Screening labs are grossly  reassuring without leukocytosis or anemia.  No significant electrolyte derangement or renal sufficiency.  Patient does have mildly elevated LFTs.  Thyroid panel not consistent with hypothyroidism.  CT of the chest abdomen pelvis without neoplastic process.  It did reveal evidence of cholelithiasis with out acute cholecystitis.  Do not believe that this is causing the patient's process at this time.  Patient was provided with GI cocktail, food and oral hydration.  Patient was also given IV hydration.  On reassessment patient was extremely orthostatic with heart rates increasing and persistent in the 150s upon standing.  Patient becomes extremely fatigued.  Etiology of patient's presentation is undetermined at this time however I feel that he would likely benefit from admission for continued work-up and management.      Final Clinical Impression(s) / ED Diagnoses Final diagnoses:  General weakness  Orthostasis  Decreased appetite  Weight loss      This chart was dictated using voice recognition software.  Despite  best efforts to proofread,  errors can occur which can change the documentation meaning.   Nira Connardama, Osmara Drummonds Eduardo, MD 11/25/19 [phone]

## 2019-11-25 NOTE — ED Notes (Signed)
Family member ANTONO [facility] [phone] cousin contact person please call to update

## 2019-11-25 NOTE — ED Notes (Signed)
Lunch Tray Ordered @ 1033. 

## 2019-11-25 NOTE — ED Notes (Signed)
Andrew Byrd)- [phone]  Family member stated that the pt is "very sick" and needs medical attention however he continuosly leaves and never gets treatment. Family member asked that he be contacted if pt tries to leave.

## 2019-11-25 NOTE — ED Notes (Signed)
Gave pt crackers and sprite, pt stated he wasn't hungry right now but encouraged him to have a little something so that we know he can successfully keep down food/hydrate

## 2019-11-26 DIAGNOSIS — E46 Unspecified protein-calorie malnutrition: Secondary | ICD-10-CM | POA: Insufficient documentation

## 2019-11-26 LAB — CBC WITH DIFFERENTIAL/PLATELET
Abs Immature Granulocytes: 0 10*3/uL (ref 0.00–0.07)
Basophils Absolute: 0 10*3/uL (ref 0.0–0.1)
Basophils Relative: 0 %
Eosinophils Absolute: 0 10*3/uL (ref 0.0–0.5)
Eosinophils Relative: 0 %
HCT: 35.5 % — ABNORMAL LOW (ref 39.0–52.0)
Hemoglobin: 11 g/dL — ABNORMAL LOW (ref 13.0–17.0)
Immature Granulocytes: 0 %
Lymphocytes Relative: 25 %
Lymphs Abs: 0.7 10*3/uL (ref 0.7–4.0)
MCH: 28.4 pg (ref 26.0–34.0)
MCHC: 31 g/dL (ref 30.0–36.0)
MCV: 91.7 fL (ref 80.0–100.0)
Monocytes Absolute: 0.3 10*3/uL (ref 0.1–1.0)
Monocytes Relative: 10 %
Neutro Abs: 1.7 10*3/uL (ref 1.7–7.7)
Neutrophils Relative %: 65 %
Platelets: 150 10*3/uL (ref 150–400)
RBC: 3.87 MIL/uL — ABNORMAL LOW (ref 4.22–5.81)
RDW: 16.8 % — ABNORMAL HIGH (ref 11.5–15.5)
WBC: 2.7 10*3/uL — ABNORMAL LOW (ref 4.0–10.5)
nRBC: 0 % (ref 0.0–0.2)

## 2019-11-26 LAB — COMPREHENSIVE METABOLIC PANEL
ALT: 50 U/L — ABNORMAL HIGH (ref 0–44)
AST: 56 U/L — ABNORMAL HIGH (ref 15–41)
Albumin: 2.5 g/dL — ABNORMAL LOW (ref 3.5–5.0)
Alkaline Phosphatase: 74 U/L (ref 38–126)
Anion gap: 12 (ref 5–15)
BUN: 10 mg/dL (ref 6–20)
CO2: 21 mmol/L — ABNORMAL LOW (ref 22–32)
Calcium: 8.6 mg/dL — ABNORMAL LOW (ref 8.9–10.3)
Chloride: 106 mmol/L (ref 98–111)
Creatinine, Ser: 0.93 mg/dL (ref 0.61–1.24)
GFR calc Af Amer: >60 mL/min (ref >60)
GFR calc non Af Amer: >60 mL/min (ref >60)
Glucose, Bld: 117 mg/dL — ABNORMAL HIGH (ref 70–99)
Potassium: 3.1 mmol/L — ABNORMAL LOW (ref 3.5–5.1)
Sodium: 139 mmol/L (ref 135–145)
Total Bilirubin: 1.3 mg/dL — ABNORMAL HIGH (ref 0.3–1.2)
Total Protein: 6.4 g/dL — ABNORMAL LOW (ref 6.5–8.1)

## 2019-11-26 LAB — HIV-1 RNA QUANT-NO REFLEX-BLD
HIV 1 RNA Quant: <20 copies/mL
LOG10 HIV-1 RNA: UNDETERMINED log10copy/mL

## 2019-11-26 LAB — CMV ANTIBODY, IGG (EIA): CMV Ab - IgG: 4.7 U/mL — ABNORMAL HIGH (ref 0.00–0.59)

## 2019-11-26 LAB — LACTATE DEHYDROGENASE: LDH: 220 U/L — ABNORMAL HIGH (ref 98–192)

## 2019-11-26 LAB — SAVE SMEAR(SSMR), FOR PROVIDER SLIDE REVIEW

## 2019-11-26 LAB — HEPATITIS B SURFACE ANTIBODY, QUANTITATIVE: Hep B S AB Quant (Post): 23.2 m[IU]/mL (ref >9.9)

## 2019-11-26 LAB — CK: Total CK: 59 U/L (ref 49–397)

## 2019-11-26 LAB — ANA W/REFLEX IF POSITIVE: Anti Nuclear Antibody (ANA): NEGATIVE

## 2019-11-26 MED ORDER — BOOST PLUS PO LIQD
237.0000 mL | Freq: Three times a day (TID) | ORAL | Status: DC
  Administered 2019-11-26 – 2019-11-29 (×8): 237 mL via ORAL
  Filled 2019-11-26 (×14): qty 237

## 2019-11-26 MED ORDER — LIP MEDEX EX OINT
TOPICAL_OINTMENT | CUTANEOUS | Status: DC | PRN
  Filled 2019-11-26: qty 7

## 2019-11-26 NOTE — Evaluation (Signed)
Occupational Therapy Evaluation Patient Details Name: Andrew Byrd MRN: [id number] DOB: [birth year] Today's Date: 11/26/2019    History of Present Illness Pt is a 38 year old man admitted on 11/25/19 with weight loss, fatigue and SOB. PMH: HTN.   Clinical Impression   Pt was independent in mobility and assisted with showering and LB dressing prior to admission due to weakness. Pt is functioning at his baseline, but could benefit from OT to educate and provide AE for LB ADL. Pt agreeable. Will follow.    Follow Up Recommendations  No OT follow up    Equipment Recommendations  None recommended by OT    Recommendations for Other Services       Precautions / Restrictions Restrictions Weight Bearing Restrictions: No      Mobility Bed Mobility Overal bed mobility: Modified Independent             General bed mobility comments: increased time, HOB up slightly  Transfers Overall transfer level: Independent Equipment used: None                  Balance                                           ADL either performed or assessed with clinical judgement   ADL Overall ADL's : Needs assistance/impaired Eating/Feeding: Independent   Grooming: Independent   Upper Body Bathing: Set up;Sitting   Lower Body Bathing: Sit to/from stand;Moderate assistance   Upper Body Dressing : Set up;Sitting   Lower Body Dressing: Moderate assistance;Sit to/from stand   Toilet Transfer: Independent   Toileting- Water quality scientist and Hygiene: Independent       Functional mobility during ADLs: Independent       Vision Baseline Vision/History: No visual deficits       Perception     Praxis      Pertinent Vitals/Pain Pain Assessment: No/denies pain     Hand Dominance Right   Extremity/Trunk Assessment Upper Extremity Assessment Upper Extremity Assessment: Overall WFL for tasks assessed   Lower Extremity Assessment Lower Extremity  Assessment: Defer to PT evaluation   Cervical / Trunk Assessment Cervical / Trunk Assessment: Normal   Communication Communication Communication: No difficulties   Cognition Arousal/Alertness: Awake/alert Behavior During Therapy: Flat affect Overall Cognitive Status: Within Functional Limits for tasks assessed                                     General Comments       Exercises     Shoulder Instructions      Home Living Family/patient expects to be discharged to:: Private residence Living Arrangements: Spouse/significant other(girlfriend) Available Help at Discharge: Available 24 hours/day;Friend(s) Type of Home: Other(Comment)(condo) Home Access: Stairs to enter [employer] of Steps: flight   Home Layout: One level     Bathroom Shower/Tub: Occupational psychologist: Handicapped height     Home Equipment: Shower seat          Prior Functioning/Environment Level of Independence: Needs assistance  Gait / Transfers Assistance Needed: ambulating independently ADL's / Homemaking Assistance Needed: girlfriend has been assisting him with showering and LB dressing            OT Problem List: Decreased activity tolerance;Decreased knowledge of use of DME or AE  OT Treatment/Interventions: Self-care/ADL training;DME and/or AE instruction    OT Goals(Current goals can be found in the care plan section) Acute Rehab OT Goals Patient Stated Goal: find out why he has no appetite OT Goal Formulation: With patient Time For Goal Achievement: 12/03/19 Potential to Achieve Goals: Good ADL Goals Additional ADL Goal #1: Pt will perform LB bathing and dressing modified independently with AE.  OT Frequency: Min 2X/week   Barriers to D/C:            Co-evaluation              AM-PAC OT "6 Clicks" Daily Activity     Outcome Measure Help from another person eating meals?: None Help from another person taking care of personal  grooming?: None Help from another person toileting, which includes using toliet, bedpan, or urinal?: None Help from another person bathing (including washing, rinsing, drying)?: A Lot Help from another person to put on and taking off regular upper body clothing?: None Help from another person to put on and taking off regular lower body clothing?: A Lot 6 Click Score: 20   End of Session    Activity Tolerance: Patient tolerated treatment well Patient left: in bed;with call bell/phone within reach  OT Visit Diagnosis: Muscle weakness (generalized) (M62.81)                Time: 1572-6203 OT Time Calculation (min): 22 min Charges:  OT General Charges $OT Visit: 1 Visit OT Evaluation $OT Eval Low Complexity: 1 Low  Martie Round, OTR/L Acute Rehabilitation Services Pager: [phone] Office: [phone]  Evern Bio 11/26/2019, 9:30 AM

## 2019-11-26 NOTE — Plan of Care (Signed)
  Problem: Education: Goal: Knowledge of General Education information will improve Description: Including pain rating scale, medication(s)/side effects and non-pharmacologic comfort measures Outcome: Progressing   Problem: Nutrition: Goal: Adequate nutrition will be maintained Outcome: Progressing   Problem: Safety: Goal: Ability to remain free from injury will improve Outcome: Progressing   

## 2019-11-26 NOTE — Progress Notes (Signed)
Subjective:   Pt was examined at bedside and he states that he is not in pain, but does report loosing his appetite and energy which caused him to be weak. At a certain point, he was bedridden and reports that it must've been due to his laziness. He denies fevers, chills but does endorse prior history of night sweats. He does have a pruritic rash. He was unable to have breakfast this morning because he does not like french toast and rarely eats bacon. We did explain the next management steps including obtaining gastric emptying study and he expressed understanding. All his questions and complains were appropriately addressed.   Objective:  Vital signs in last 24 hours: Vitals:   11/25/19 1427 11/25/19 1703 11/26/19 0016 11/26/19 0440  BP: 119/82 115/88 119/73 115/76  Pulse: 99 98 91 87  Resp: (!) _0 Temp:  98.6 F (37 C) 98.5 F (36.9 C) 98.5 F (36.9 C)  TempSrc:  Oral Oral Oral  SpO2: 100% 99% 99% 99%   Physical Exam: General: Resting in bed comfortably, NAD HEENT: Erythematous dried rash upon the superior aspect of eyelids. Nasal bridge deformity, saddle-nose appearing CV: RRR, normal S1-S2 no murmurs rubs or gallops appreciated, warm extremities PULM: Clear to auscultation bilaterally, no crackles or wheezes appreciated ABD: Normal bowel sounds, soft and nontender in all quadrants. Skin: Erythematous dry rash on the superior aspect of the eyelids.  Dry skin in the periumbilical region.  Dry and cracked skin upon bilateral shins.  Erythematous nodules on bilateral hands upon the middle and pointer fingers. NERUO: Alert and oriented, no focal deficits  Assessment/Plan:  Principal Problem:   Protein calorie malnutrition (HCC) Active Problems:   Physical deconditioning   Early satiety   Leukopenia   Normocytic anemia  In summary, Andrew Byrd is a 38 year old male with past medical history significant for HTN who presents with 3 months of anorexia, weight loss,  fatigue, intermittent joint pain responsive to steroids and NSAIDs, and a rash on his fingers his eyelids and abdomen.  Unknown what is causing his  presentation at this time, however his constellations of symptoms appear to be rheumatologic in nature.  Differential includes lupus, dermatomyositis, as well as syphilis.  #Anorexia #Protein Calorie Malnutrition #Deconditioning: Patient has had several months of deceased appetite and early setiety leading to decreased PO intake and weight loss. This has contributed to his ongoing fatigue, weakness, and deconditioning. He has been spending most of his time in bed for the past month or so. He denies depression or anxiety, but does endorse stress related to him being out of work, having to go to court, and having 6 children.  -Gastric emptying study ordered - Nutrition Consult - PT/OT Eval and Treat - Supplemental Protein Shakes - Multi-VItamin  #Fatigue #Normocytic Anemia #Leukopenia #Joint Pain: Chronic fatigue with episodes of joint pain (relieved by steroids in the past). Some of his fatigue is related to his ongoing anorexia, weight loss, and deconditioning. Given his joint pain swelling there is concern for possible inflammatory or viral process. ANA, RF, HIV, and RPR previously negative. TSH, T4 elevated in the ED without significant abnormality. CRP normal but elevated sed rate of 57. Hepatitis panel negative.  CK normal.  LDH elevated to 220.  If initial work-up negative patient may need bone marrow biopsy to evaluate for Myelodysplastic Syndrome. -CMV IgG antibody positive -CMV PCR EBV PCR ordered -Anti-treponemal antibody ordered -HIV RNA pending  #Rash: Could be related to systemic inflammatory  disease or a component of acute viral process, has had improvement with triamcinolone in the past. -Triamcinlone  #HTN: Patient reports that he ran out of his medication.  Currently normotensive. -Continue to monitor  #FEN/GI -Diet: Regular  w/ supplemental shakes  -Fluids: None  #DVT prophylaxis - Lovenox 40 mg subq injections daily   #CODE STATUS: FULL  Dispo: Pending medical course.   Earlene Plater, MD Internal Medicine, PGY1 Pager: [phone]  11/26/2019,12:11 PM

## 2019-11-26 NOTE — Progress Notes (Signed)
Initial Nutrition Assessment  DOCUMENTATION CODES:   Severe malnutrition in context of social or environmental circumstances  INTERVENTION:    Boost Plus chocolate TID- Each supplement provides 360kcal and 14g protein.    Magic cup TID with meals, each supplement provides 290 kcal and 9 grams of protein  MVI daily   NUTRITION DIAGNOSIS:   Severe Malnutrition related to social / environmental circumstances(stress) as evidenced by mild fat depletion, mild muscle depletion, percent weight loss, energy intake < or equal to 50% for > or equal to 1 month.  GOAL:   Patient will meet greater than or equal to 90% of their needs  MONITOR:   PO intake, Supplement acceptance, Weight trends, Labs, I & O's  REASON FOR ASSESSMENT:   Consult Malnutrition Eval  ASSESSMENT:   Patient with PMH significant for HTN. Presents this admission chronic fatigue with episodes of join pain from unknown etiology.  Pt reports having a loss in appetite two months PTA due to ongoing stress (from court issues?). States during this time period he would eat one meal daily that consisted of a meat with grain. Over the last month his appetite worsened and he could only tolerate 2 oranges and handful of grapes daily. Started drinking Ensure one month ago, but stopped due to diarrhea. Discussed the importance of protein intake for preservation of lean body mass. Pt consumed a piece of bacon and french toast for breakfast. Will try Boost Plus.   Pt endorses a UBW of 300 lb and a recent wt loss of 50 lb within the last two months. Records indicate pt weighed 319 lb on 8/30 and 223 lb this admission (30% wt loss in 4 months, significant for time frame).    Medications: MVI with minerals Labs: K 3.1 (L) elevated LFTs   Diet Order:   Diet Order            Diet regular Room service appropriate? Yes; Fluid consistency: Thin  Diet effective now              EDUCATION NEEDS:   Education needs have been  addressed  Skin:  Skin Assessment: Reviewed RN Assessment  Last BM:  12/28  Height:   Ht Readings from Last 1 Encounters:  11/26/19 5\' 11"  (1.803 m)    Weight:   Wt Readings from Last 1 Encounters:  11/26/19 101.3 kg    Ideal Body Weight:  78.2 kg  BMI:  Body mass index is 31.16 kg/m.  Estimated Nutritional Needs:   Kcal:  2400-2600  Protein:  120-135 grams  Fluid:  >/= 2.4 L/day   Mariana Single RD, LDN Clinical Nutrition Pager # - [phone]

## 2019-11-26 NOTE — Evaluation (Signed)
Physical Therapy Evaluation Patient Details Name: Andrew Byrd MRN: [id number] DOB: February 28, 1981 Today's Date: 11/26/2019   History of Present Illness  Pt is 38 yo male with Hx of HTN presenting with 3 months of decreased decreased appetite, fatigue, and weight loss.  Clinical Impression  Pt admitted with above diagnosis. Pt is transferring and ambulating to bathroom independently.  However, he does present with muscle weakness, decreased endurance, and abnormal gait pattern that will benefit from acute PT while hospitalized.   Pt was limited at eval due to upset stomach.  Pt currently with functional limitations due to the deficits listed below (see PT Problem List). Pt will benefit from skilled PT to increase their independence and safety with mobility to allow discharge to the venue listed below.       Follow Up Recommendations No PT follow up    Equipment Recommendations  None recommended by PT    Recommendations for Other Services       Precautions / Restrictions Precautions Precautions: None Restrictions Weight Bearing Restrictions: No      Mobility  Bed Mobility Overal bed mobility: Modified Independent             General bed mobility comments: increased time, HOB up slightly  Transfers Overall transfer level: Modified independent Equipment used: None Transfers: Sit to/from Stand Sit to Stand: Modified independent (Device/Increase time)         General transfer comment: required bed elevated (did not want to attempt from lower surface)  Ambulation/Gait Ambulation/Gait assistance: Min guard Gait Distance (Feet): 150 Feet Assistive device: None Gait Pattern/deviations: Wide base of support Gait velocity: decreased   General Gait Details: stiff throughout lower extremities; increased lateral weight shift ; increased use of trunk rotation/shift to advance lower extremities; fatigue easily -reports stomach getting upset with walking  Stairs             Wheelchair Mobility    Modified Rankin (Stroke Patients Only)       Balance Overall balance assessment: Needs assistance Sitting-balance support: No upper extremity supported;Feet unsupported Sitting balance-Leahy Scale: Normal     Standing balance support: No upper extremity supported;During functional activity Standing balance-Leahy Scale: Good                               Pertinent Vitals/Pain Pain Assessment: No/denies pain    Home Living Family/patient expects to be discharged to:: Private residence Living Arrangements: Spouse/significant other Available Help at Discharge: Available 24 hours/day;Friend(s) Type of Home: Other(Comment)(condo) Home Access: Stairs to enter Entrance Stairs-Rails: Right;Left;Can reach both Entrance Stairs-Number of Steps: 2 flights Home Layout: One level Home Equipment: Shower seat      Prior Function Level of Independence: Needs assistance   Gait / Transfers Assistance Needed: ambulating independently but fatigues  ADL's / Homemaking Assistance Needed: girlfriend has been assisting him with showering and LB dressing        Hand Dominance   Dominant Hand: Right    Extremity/Trunk Assessment   Upper Extremity Assessment Upper Extremity Assessment: Defer to OT evaluation    Lower Extremity Assessment Lower Extremity Assessment: RLE deficits/detail;LLE deficits/detail RLE Deficits / Details: ROM WFL; MMT 4+/5 LLE Deficits / Details: ROM WFL; MMT 4+/5    Cervical / Trunk Assessment Cervical / Trunk Assessment: Normal  Communication   Communication: No difficulties  Cognition Arousal/Alertness: Awake/alert Behavior During Therapy: Flat affect Overall Cognitive Status: Within Functional Limits for tasks assessed  General Comments      Exercises     Assessment/Plan    PT Assessment Patient needs continued PT services  PT Problem List Decreased  strength;Decreased mobility;Decreased range of motion;Decreased activity tolerance;Decreased balance       PT Treatment Interventions DME instruction;Therapeutic activities;Gait training;Therapeutic exercise;Patient/family education;Stair training;Functional mobility training;Balance training    PT Goals (Current goals can be found in the Care Plan section)  Acute Rehab PT Goals Patient Stated Goal: find out why he has no appetite; home at discharge Time For Goal Achievement: 12/09/19 Potential to Achieve Goals: Good    Frequency Min 3X/week   Barriers to discharge Inaccessible home environment      Co-evaluation               AM-PAC PT "6 Clicks" Mobility  Outcome Measure Help needed turning from your back to your side while in a flat bed without using bedrails?: None Help needed moving from lying on your back to sitting on the side of a flat bed without using bedrails?: None Help needed moving to and from a bed to a chair (including a wheelchair)?: None Help needed standing up from a chair using your arms (e.g., wheelchair or bedside chair)?: None Help needed to walk in hospital room?: None Help needed climbing 3-5 steps with a railing? : A Little 6 Click Score: 23    End of Session Equipment Utilized During Treatment: Gait belt Activity Tolerance: Patient limited by fatigue Patient left: in bed;with call bell/phone within reach(pt has been going to restroom independently) Nurse Communication: Mobility status(pt requesting wider BSC - checked empty rooms, one not available, notified RN) PT Visit Diagnosis: Unsteadiness on feet (R26.81);Other abnormalities of gait and mobility (R26.89);Muscle weakness (generalized) (M62.81)    Time: 1962-2297 PT Time Calculation (min) (ACUTE ONLY): 12 min   Charges:   PT Evaluation $PT Eval Low Complexity: 1 Low          Maggie Font, PT Acute Rehab Services Pager [phone] [facility] [phone] [facility]  [phone]   Karlton Lemon 11/26/2019, 12:12 PM

## 2019-11-27 ENCOUNTER — Inpatient Hospital Stay (HOSPITAL_COMMUNITY): Payer: Medicaid Other

## 2019-11-27 LAB — BASIC METABOLIC PANEL
Anion gap: 9 (ref 5–15)
BUN: 9 mg/dL (ref 6–20)
CO2: 23 mmol/L (ref 22–32)
Calcium: 8.4 mg/dL — ABNORMAL LOW (ref 8.9–10.3)
Chloride: 107 mmol/L (ref 98–111)
Creatinine, Ser: 0.73 mg/dL (ref 0.61–1.24)
GFR calc Af Amer: >60 mL/min (ref >60)
GFR calc non Af Amer: >60 mL/min (ref >60)
Glucose, Bld: 98 mg/dL (ref 70–99)
Potassium: 3 mmol/L — ABNORMAL LOW (ref 3.5–5.1)
Sodium: 139 mmol/L (ref 135–145)

## 2019-11-27 LAB — CMV DNA, QUANTITATIVE, PCR
CMV DNA Quant: NEGATIVE IU/mL
Log10 CMV Qn DNA Pl: UNDETERMINED log10 IU/mL

## 2019-11-27 LAB — CBC
HCT: 31.2 % — ABNORMAL LOW (ref 39.0–52.0)
Hemoglobin: 9.8 g/dL — ABNORMAL LOW (ref 13.0–17.0)
MCH: 28.7 pg (ref 26.0–34.0)
MCHC: 31.4 g/dL (ref 30.0–36.0)
MCV: 91.2 fL (ref 80.0–100.0)
Platelets: 136 10*3/uL — ABNORMAL LOW (ref 150–400)
RBC: 3.42 MIL/uL — ABNORMAL LOW (ref 4.22–5.81)
RDW: 16.7 % — ABNORMAL HIGH (ref 11.5–15.5)
WBC: 2.2 10*3/uL — ABNORMAL LOW (ref 4.0–10.5)
nRBC: 0 % (ref 0.0–0.2)

## 2019-11-27 LAB — EPSTEIN BARR VRS(EBV DNA BY PCR)
EBV DNA QN by PCR: NEGATIVE copies/mL
log10 EBV DNA Qn PCR: UNDETERMINED log10 copy/mL

## 2019-11-27 LAB — FLUORESCENT TREPONEMAL AB(FTA)-IGG-BLD: Fluorescent Treponemal Ab, IgG: NONREACTIVE

## 2019-11-27 LAB — MAGNESIUM: Magnesium: 2 mg/dL (ref 1.7–2.4)

## 2019-11-27 MED ORDER — TECHNETIUM TC 99M SULFUR COLLOID
1.9000 | Freq: Once | INTRAVENOUS | Status: AC | PRN
  Administered 2019-11-27: 11:00:00 1.9 via INTRAVENOUS

## 2019-11-27 MED ORDER — POTASSIUM CHLORIDE CRYS ER 20 MEQ PO TBCR
40.0000 meq | EXTENDED_RELEASE_TABLET | Freq: Four times a day (QID) | ORAL | Status: AC
  Administered 2019-11-27 (×2): 40 meq via ORAL
  Filled 2019-11-27 (×2): qty 2

## 2019-11-27 NOTE — Progress Notes (Addendum)
Patient left unit for NM at this time. Will administered oral medications post NM.  Ave Filter, RN

## 2019-11-27 NOTE — Progress Notes (Signed)
Patient is currently NPO for NM GASTRIC EMPTYING at this time. Per radiology tech, will transport to NM in the new few hours. Patient updated and aware. Will administered med post NM.  Ave Filter, RN

## 2019-11-27 NOTE — Progress Notes (Signed)
OT Cancellation Note  Patient Details Name: Andrew Byrd MRN: [id number] DOB: 09-06-1981   Cancelled Treatment:    Reason Eval/Treat Not Completed: Patient at procedure or test/ unavailable.    Janice Coffin, COTA/L 11/27/2019, 11:16 AM

## 2019-11-27 NOTE — Progress Notes (Signed)
Patient returns to room from [place] at this time. VSS. Daily medication administered. Will continue to monitor.  Ave Filter, RN

## 2019-11-27 NOTE — Progress Notes (Signed)
Subjective:   Pt seen at the bedside this AM. He states that he is feeling well and has no complaints whatsoever. We did make him aware that his CMV antibodies resulted positive and that we are still pending other serology findings. He reports of feeling hungry. All his questions and concerns were appropriately addressed. He states that he will like to go home to see his kids.   Objective:  Vital signs in last 24 hours: Vitals:   11/26/19 1453 11/26/19 1804 11/27/19 0102 11/27/19 0626  BP:  123/86 110/84 112/79  Pulse:  86 85 80  Resp:  _0 Temp:  98 F (36.7 C) 97.6 F (36.4 C) 98.2 F (36.8 C)  TempSrc:  Oral Oral Oral  SpO2:  100% 100% 100%  Weight:    100.7 kg  Height: _1  (1.803 m)      Physical Exam: General: Resting in bed comfortably, NAD HEENT: Erythematous dried rash upon the superior aspect of eyelids. Nasal bridge deformity, saddle-nose appearing CV: RRR, normal S1-S2 no murmurs rubs or gallops appreciated PULM: CTAB, no crackles or wheezes appreciated ABD: Normal bowel sounds, soft and nontender in all quadrants. Skin: Erythematous dry rash on the superior aspect of the eyelids.  Dry skin in the periumbilical region.  Dry and cracked skin upon bilateral shins.  Erythematous nodules on bilateral hands upon the middle and pointer fingers. NERUO: Alert and oriented, no focal deficits  Assessment/Plan:  Principal Problem:   Protein calorie malnutrition (HCC) Active Problems:   Physical deconditioning   Early satiety   Leukopenia   Normocytic anemia   Protein-calorie malnutrition, severe  In summary, Andrew Byrd is a 38 year old male with past medical history significant for HTN who presents with 3 months of anorexia, weight loss, fatigue, intermittent joint pain responsive to steroids and NSAIDs, and a rash on his fingers his eyelids and abdomen. It is unknown what is causing his  presentation at this time, however his constellations of symptoms appear to  be rheumatologic in nature.   #Anorexia #Protein Calorie Malnutrition #Deconditioning: Patient has had several months of deceased appetite and early setiety leading to decreased PO intake and weight loss. This has contributed to his ongoing fatigue, weakness, and deconditioning. He has been spending most of his time in bed for the past month or so. He denies depression or anxiety, but does endorse stress related to him being out of work, having to go to court, and having 6 children.  -Gastric emptying study ordered - PT/OT evaluated and pt does not need f/u - Nutrition Consult, will appreciate recs - Supplemental Protein Shakes - Multi-VItamin  #Fatigue #Normocytic Anemia #Leukopenia #Joint Pain: Chronic fatigue with episodes of joint pain (relieved by steroids in the past). Some of his fatigue is related to his ongoing anorexia, weight loss, and deconditioning. Given his joint pain swelling there is concern for possible inflammatory or viral process. ANA, RF, HIV, and RPR previously negative. TSH, T4 elevated in the ED without significant abnormality. CRP normal but elevated sed rate of 57. Hepatitis panel negative.  CK normal.  LDH elevated to 220.  If initial work-up negative patient may need bone marrow biopsy to evaluate for Myelodysplastic Syndrome. -CMV IgG antibody positive -CMV PCR EBV PCR pending -Anti-treponemal antibody pending -Smear pending    #Rash: Could be related to systemic inflammatory disease or a component of acute viral process, has had improvement with triamcinolone in the past. -Continue triamcinlone  #HTN: Patient reports that he ran  out of his medication. Currently normotensive. -Continue to monitor  #FEN/GI #Hypokalemia: 3.0 on BMP this AM. Magnesium level checked and was normal -Repleted w/ K+ tablets, repeat BMP tomorrow AM -Diet: Regular w/ supplemental shakes  -Fluids: None  #DVT prophylaxis - Lovenox 40 mg subq injections daily   #CODE STATUS:  FULL  Dispo: Pending medical course.   Andrew Plater, MD Internal Medicine, PGY1 Pager: [phone]  11/27/2019,3:19 PM

## 2019-11-27 NOTE — Progress Notes (Signed)
PT Cancellation Note  Patient Details Name: Andrew Byrd MRN: [id number] DOB: 1981-08-16   Cancelled Treatment:    Reason Eval/Treat Not Completed: Patient at procedure or test/unavailable. Pt currently off unit at [facility]. Will continue to follow and progress PT POC as able.    Thelma Comp 11/27/2019, 1:39 PM  Rolinda Roan, PT, DPT Acute Rehabilitation Services Pager: [phone] Office: [phone]

## 2019-11-28 LAB — DIFFERENTIAL
Abs Immature Granulocytes: 0 10*3/uL (ref 0.00–0.07)
Basophils Absolute: 0 10*3/uL (ref 0.0–0.1)
Basophils Relative: 0 %
Eosinophils Absolute: 0 10*3/uL (ref 0.0–0.5)
Eosinophils Relative: 0 %
Lymphocytes Relative: 8 %
Lymphs Abs: 0.2 10*3/uL — ABNORMAL LOW (ref 0.7–4.0)
Monocytes Absolute: 0.1 10*3/uL (ref 0.1–1.0)
Monocytes Relative: 7 %
Neutro Abs: 1.7 10*3/uL (ref 1.7–7.7)
Neutrophils Relative %: 85 %
nRBC: 0 /100 WBC

## 2019-11-28 LAB — BASIC METABOLIC PANEL
Anion gap: 6 (ref 5–15)
BUN: 8 mg/dL (ref 6–20)
CO2: 23 mmol/L (ref 22–32)
Calcium: 8.2 mg/dL — ABNORMAL LOW (ref 8.9–10.3)
Chloride: 108 mmol/L (ref 98–111)
Creatinine, Ser: 0.76 mg/dL (ref 0.61–1.24)
GFR calc Af Amer: >60 mL/min (ref >60)
GFR calc non Af Amer: >60 mL/min (ref >60)
Glucose, Bld: 112 mg/dL — ABNORMAL HIGH (ref 70–99)
Potassium: 3.6 mmol/L (ref 3.5–5.1)
Sodium: 137 mmol/L (ref 135–145)

## 2019-11-28 LAB — CBC
HCT: 31.6 % — ABNORMAL LOW (ref 39.0–52.0)
Hemoglobin: 9.8 g/dL — ABNORMAL LOW (ref 13.0–17.0)
MCH: 28.7 pg (ref 26.0–34.0)
MCHC: 31 g/dL (ref 30.0–36.0)
MCV: 92.4 fL (ref 80.0–100.0)
Platelets: 123 10*3/uL — ABNORMAL LOW (ref 150–400)
RBC: 3.42 MIL/uL — ABNORMAL LOW (ref 4.22–5.81)
RDW: 16.6 % — ABNORMAL HIGH (ref 11.5–15.5)
WBC: 2 10*3/uL — ABNORMAL LOW (ref 4.0–10.5)
nRBC: 0 % (ref 0.0–0.2)

## 2019-11-28 MED ORDER — POTASSIUM CHLORIDE CRYS ER 20 MEQ PO TBCR
40.0000 meq | EXTENDED_RELEASE_TABLET | Freq: Two times a day (BID) | ORAL | Status: AC
  Administered 2019-11-28 – 2019-11-29 (×4): 40 meq via ORAL
  Filled 2019-11-28 (×4): qty 2

## 2019-11-28 NOTE — Progress Notes (Signed)
Subjective:   Pt seen at the bedside this AM. He is doing well and reports that he has been ambulating in the hallway with the nursing staff. We made him aware that we would have to obtain a bone marrow biopsy but he states that he is "scared" of the procedure and would like to try physical therapy instead of getting the biopsy.  He is very reluctant and would like to think about it some more.  I told the patient I could talk to him later about it.  He ate grits this morning and tolerated it well.  All concerns were addressed.  Objective: CBC Latest Ref Rng & Units 11/28/2019 11/27/2019 11/26/2019  WBC 4.0 - 10.5 K/uL 2.0(L) 2.2(L) 2.7(L)  Hemoglobin 13.0 - 17.0 g/dL 9.8(L) 9.8(L) 11.0(L)  Hematocrit 39.0 - 52.0 % 31.6(L) 31.2(L) 35.5(L)  Platelets 150 - 400 K/uL 123(L) 136(L) 150   BMP Latest Ref Rng & Units 11/28/2019 11/27/2019 11/26/2019  Glucose 70 - 99 mg/dL 112(H) 98 117(H)  BUN 6 - 20 mg/dL _0 Creatinine 0.61 - 1.24 mg/dL 0.76 0.73 0.93  Sodium 135 - 145 mmol/L 137 139 139  Potassium 3.5 - 5.1 mmol/L 3.6 3.0(L) 3.1(L)  Chloride 98 - 111 mmol/L 108 107 106  CO2 22 - 32 mmol/L 23 23 21(L)  Calcium 8.9 - 10.3 mg/dL 8.2(L) 8.4(L) 8.6(L)   Vital signs in last 24 hours: Vitals:   11/27/19 0626 11/27/19 1546 11/27/19 2306 11/28/19 0442  BP: 112/79 (!) 127/97 118/86 115/82  Pulse: 80 84 80 77  Resp: _1 Temp: 98.2 F (36.8 C) 98 F (36.7 C) 98.3 F (36.8 C) 98.4 F (36.9 C)  TempSrc: Oral Oral Oral Oral  SpO2: 100% 100% 100% 100%  Weight: 100.7 kg   98.6 kg  Height:       Physical Exam: General: Appears comfortable, resting in bed HEENT: NCAT, erythematous dry rash on the superior aspect of the eyelids CV: RRR, normal S1-S2 no murmurs rubs or gallops appreciated PULM: Clear in all lung fields bilaterally ABD: Soft and nontender in all quadrants Skin: Erythematous dry rash upon the superior aspect of the eyelids periumbilical region. Erythematous nodules on  bilateral hands upon the middle and pointer fingers.  NERUO: Alert and oriented, no focal deficits appreciated  Assessment/Plan:  Principal Problem:   Protein calorie malnutrition ([place]) Active Problems:   Physical deconditioning   Early satiety   Leukopenia   Normocytic anemia   Protein-calorie malnutrition, severe  In summary, Mr. Harriman is a 39 year old male with past medical history significant for HTN who presents with 3 months of anorexia, weight loss, fatigue, intermittent joint pain responsive to steroids and NSAIDs, and a rash on his fingers his eyelids and abdomen.  The patient currently has pancytopenia that has worsened over the last few days.  The next appropriate step in management is to get a bone marrow biopsy, which the patient expresses reluctance to.  #Anorexia #Fatigue #Deconditioning: Patient has had several months of deceased appetite and early setiety leading to decreased PO intake and weight loss. This has contributed to his ongoing fatigue, weakness, and deconditioning. He has been spending most of his time in bed for the past month or so. He denies depression or anxiety, but does endorse stress related to him being out of work, having to go to court, and having 6 children.  -Gastric emptying study normal - Nutrition Consult, will appreciate [place] -  Multi-Vitamin -PT/OT  #Pancytopenia #Joint Pain: Chronic fatigue with episodes of joint pain (relieved by steroids in the past). Some of his fatigue is related to his ongoing anorexia, weight loss, and deconditioning. Given his joint pain swelling there is concern for possible inflammatory or viral process. ANA, RF, HIV, and RPR previously negative. TSH, T4 elevated in the ED without significant abnormality. CRP normal but elevated sed rate of 57. Hepatitis panel negative.  CK normal.  LDH elevated to 220. Initial work-up has been negative at this point, and I believe the patient needs a bone  marrow biopsy to evaluate for Myelodysplastic Syndrome. -CMV PCR, EBV PCR, Anti-treponemal antibody negative -Smear pending -Bone marrow biopsy indicated, but patient reluctant. Will continue to talk about this with the patient.  #Rash: Could be related to systemic inflammatory disease or a component of acute viral process, has had improvement with triamcinolone in the past. -Continue triamcinolone PRN  #HTN: Patient reports that he ran out of his medication. Currently normotensive. -Continue to monitor  #FEN/GI -Diet: Regular w/ supplemental shakes  -Fluids: None  #DVT prophylaxis - Lovenox 40 mg subq injections daily   #CODE STATUS: FULL  Dispo: Pending medical course.   Earlene Plater, MD Internal Medicine, PGY1 Pager: [phone]  11/28/2019,10:57 AM

## 2019-11-28 NOTE — Progress Notes (Signed)
Patient declined initial morning labs around 1am. C/o of timing too early. Educated on importance of blood work. Verbalized understanding. Lab to retry at later time in AM.

## 2019-11-28 NOTE — Progress Notes (Signed)
Physical Therapy Treatment Patient Details Name: Andrew Byrd MRN: [id number] DOB: 23-Jun-1981 Today's Date: 11/28/2019    History of Present Illness Pt is 39 yo male with Hx of HTN presenting with 3 months of decreased decreased appetite, fatigue, and weight loss. Noted Internal Medicine has recommended bone marrow biopsy due to pancytopenia but pt is hesitant.    PT Comments    Pt continues to c/o significant fatigue.  Required rest breaks with ambulation and exercises.  Required min A to stand from low surface.  Ambulated 180' with min guard but reports fatigue.  Educated on HEP with theraband and AROM.  Significant other was present and reports can assist with HEP.      Follow Up Recommendations  Outpatient PT     Equipment Recommendations  None recommended by PT    Recommendations for Other Services       Precautions / Restrictions Precautions Precautions: None    Mobility  Bed Mobility               General bed mobility comments: in chair  Transfers Overall transfer level: Needs assistance Equipment used: None Transfers: Sit to/from Stand Sit to Stand: Min assist         General transfer comment: from low chair: performed x 2, cues for hand placement  Ambulation/Gait Ambulation/Gait assistance: Min guard Gait Distance (Feet): 180 Feet Assistive device: None Gait Pattern/deviations: Decreased stride length;Shuffle;Wide base of support Gait velocity: decreased   General Gait Details: Tends to shuffle feet, increased lateral weight shift; performed 30' with marching motion for improved ROM and for progression toward stairs - Note pt reports significant fatigue   Stairs             Wheelchair Mobility    Modified Rankin (Stroke Patients Only)       Balance Overall balance assessment: Needs assistance Sitting-balance support: No upper extremity supported;Feet unsupported Sitting balance-Leahy Scale: Normal     Standing balance support: No  upper extremity supported;During functional activity Standing balance-Leahy Scale: Good                              Cognition Arousal/Alertness: Awake/alert Behavior During Therapy: Flat affect Overall Cognitive Status: Within Functional Limits for tasks assessed                                        Exercises Other Exercises Other Exercises: mini squats x 5 (limited motion due to weakness) Other Exercises: Heel raises x 10; LAQ x 10 Other Exercises: Pt educated on LAQ and clam shell exercises with theraband; also asked about UE exercises and educated on tricep push ups and elbow flex/ext, shoulder abd, with Tband; gave orange T band    General Comments General comments (skin integrity, edema, etc.): HR up to 122 bpm with activity (90 bpm rest)      Pertinent Vitals/Pain Pain Assessment: No/denies pain    Home Living                      Prior Function            PT Goals (current goals can now be found in the care plan section) Progress towards PT goals: Progressing toward goals    Frequency    Min 3X/week      PT Plan Current plan remains  appropriate    Co-evaluation              AM-PAC PT "6 Clicks" Mobility   Outcome Measure  Help needed turning from your back to your side while in a flat bed without using bedrails?: None Help needed moving from lying on your back to sitting on the side of a flat bed without using bedrails?: None Help needed moving to and from a bed to a chair (including a wheelchair)?: A Little Help needed standing up from a chair using your arms (e.g., wheelchair or bedside chair)?: A Little Help needed to walk in hospital room?: None Help needed climbing 3-5 steps with a railing? : A Little 6 Click Score: 21    End of Session Equipment Utilized During Treatment: Gait belt Activity Tolerance: Patient limited by fatigue Patient left: with call bell/phone within reach;in chair;with  family/visitor present Nurse Communication: Mobility status PT Visit Diagnosis: Unsteadiness on feet (R26.81);Other abnormalities of gait and mobility (R26.89);Muscle weakness (generalized) (M62.81)     Time: 2998-0699 PT Time Calculation (min) (ACUTE ONLY): 23 min  Charges:  $Gait Training: 8-22 mins $Therapeutic Exercise: 8-22 mins                     Maggie Font, PT Acute Rehab Services Pager [phone] [facility] [phone] [facility] [place] 11/28/2019, 4:39 PM

## 2019-11-29 LAB — CBC
HCT: 29.5 % — ABNORMAL LOW (ref 39.0–52.0)
Hemoglobin: 9.1 g/dL — ABNORMAL LOW (ref 13.0–17.0)
MCH: 28.4 pg (ref 26.0–34.0)
MCHC: 30.8 g/dL (ref 30.0–36.0)
MCV: 92.2 fL (ref 80.0–100.0)
Platelets: 102 10*3/uL — ABNORMAL LOW (ref 150–400)
RBC: 3.2 MIL/uL — ABNORMAL LOW (ref 4.22–5.81)
RDW: 16.8 % — ABNORMAL HIGH (ref 11.5–15.5)
WBC: 2.1 10*3/uL — ABNORMAL LOW (ref 4.0–10.5)
nRBC: 0 % (ref 0.0–0.2)

## 2019-11-29 LAB — COMPREHENSIVE METABOLIC PANEL
ALT: 52 U/L — ABNORMAL HIGH (ref 0–44)
AST: 56 U/L — ABNORMAL HIGH (ref 15–41)
Albumin: 2.1 g/dL — ABNORMAL LOW (ref 3.5–5.0)
Alkaline Phosphatase: 68 U/L (ref 38–126)
Anion gap: 6 (ref 5–15)
BUN: 9 mg/dL (ref 6–20)
CO2: 24 mmol/L (ref 22–32)
Calcium: 8.1 mg/dL — ABNORMAL LOW (ref 8.9–10.3)
Chloride: 108 mmol/L (ref 98–111)
Creatinine, Ser: 0.68 mg/dL (ref 0.61–1.24)
GFR calc Af Amer: >60 mL/min (ref >60)
GFR calc non Af Amer: >60 mL/min (ref >60)
Glucose, Bld: 106 mg/dL — ABNORMAL HIGH (ref 70–99)
Potassium: 4.1 mmol/L (ref 3.5–5.1)
Sodium: 138 mmol/L (ref 135–145)
Total Bilirubin: 0.7 mg/dL (ref 0.3–1.2)
Total Protein: 5.2 g/dL — ABNORMAL LOW (ref 6.5–8.1)

## 2019-11-29 LAB — RETICULOCYTES
Immature Retic Fract: 16.1 % — ABNORMAL HIGH (ref 2.3–15.9)
RBC.: 3.26 MIL/uL — ABNORMAL LOW (ref 4.22–5.81)
Retic Count, Absolute: 41.4 10*3/uL (ref 19.0–186.0)
Retic Ct Pct: 1.3 % (ref 0.4–3.1)

## 2019-11-29 LAB — IRON AND TIBC
Iron: 41 ug/dL — ABNORMAL LOW (ref 45–182)
Saturation Ratios: 23 % (ref 17.9–39.5)
TIBC: 178 ug/dL — ABNORMAL LOW (ref 250–450)
UIBC: 137 ug/dL

## 2019-11-29 MED ORDER — SODIUM CHLORIDE 0.9 % IV SOLN
510.0000 mg | Freq: Once | INTRAVENOUS | Status: DC
  Filled 2019-11-29: qty 17

## 2019-11-29 NOTE — Progress Notes (Addendum)
Subjective:   Pt seen at the bedside this AM.  Patient states that he tried walking to the nursing station yesterday and felt more tired than the day prior.  Patient called his girlfriend so that we could all discuss the plan together.  Patient is still expressing hesitation to get the bone marrow biopsy.  After discussing the risks and benefits, the patient decided he would move forward with the procedure.  All concerns were addressed.  Objective: CBC Latest Ref Rng & Units 11/29/2019 11/28/2019 11/27/2019  WBC 4.0 - 10.5 K/uL 2.1(L) 2.0(L) 2.2(L)  Hemoglobin 13.0 - 17.0 g/dL 9.1(L) 9.8(L) 9.8(L)  Hematocrit 39.0 - 52.0 % 29.5(L) 31.6(L) 31.2(L)  Platelets 150 - 400 K/uL 102(L) 123(L) 136(L)   BMP Latest Ref Rng & Units 11/29/2019 11/28/2019 11/27/2019  Glucose 70 - 99 mg/dL 106(H) 112(H) 98  BUN 6 - 20 mg/dL '9 8 9  ' Creatinine 0.61 - 1.24 mg/dL 0.68 0.76 0.73  Sodium 135 - 145 mmol/L 138 137 139  Potassium 3.5 - 5.1 mmol/L 4.1 3.6 3.0(L)  Chloride 98 - 111 mmol/L 108 108 107  CO2 22 - 32 mmol/L '24 23 23  ' Calcium 8.9 - 10.3 mg/dL 8.1(L) 8.2(L) 8.4(L)   Vital signs in last 24 hours: Vitals:   11/28/19 1842 11/28/19 2316 11/29/19 0606 11/29/19 0607  BP: 119/88 107/76  102/71  Pulse: 86 81  76  Resp: '16 17  18  ' Temp: 97.9 F (36.6 C) 98.3 F (36.8 C)  98.7 F (37.1 C)  TempSrc: Oral Oral  Oral  SpO2: 100% 99%  100%  Weight:   100.2 kg   Height:       Physical Exam: General: Resting in bed comfortably HEENT: NCAT CV: RRR, normal S1-S2 no murmurs rubs or gallops appreciated PULM: Clear to auscultation bilaterally, no crackles or wheezes ABD: Soft and nontender in all quadrants Skin: Erythematous dry rash upon the superior aspect of the eyelids periumbilical region is present, but improved from previous days.  Erythematous nodules on bilateral hands upon the middle and pointer fingers.  NERUO: Alert and oriented, no focal deficits  Assessment/Plan:  Principal Problem:   Protein  calorie malnutrition (HCC) Active Problems:   Physical deconditioning   Early satiety   Leukopenia   Normocytic anemia   Protein-calorie malnutrition, severe  In summary, Andrew Byrd is a 39 year old male with past medical history significant for HTN who presents with 3 months of anorexia, weight loss, fatigue, intermittent joint pain responsive to steroids and NSAIDs, and a rash on his fingers his eyelids and abdomen. The patient currently has pancytopenia that has continued to worsen over the last few days.  The next appropriate step in management is to get a bone marrow biopsy, which the patient expresses reluctance to.  However, today the patient says he is agreeable and will move forward with the procedure.   #Anorexia #Fatigue #Hx Joint pain #Pancytopenia: Patient has had several months of deceased appetite and early setiety leading to decreased PO intake and weight loss. This has contributed to his ongoing fatigue, weakness, and deconditioning. He has been spending most of his time in bed for the past month or so. Gastric emptying study normal.  Extensive infectious work-up has been negative to this point.  Lyme disease antibodies pending currently.  Pancytopenia has worsened daily since admission, which is concerning for myelodysplastic syndrome. -IR Bone marrow biopsy ordered -Haptoglobin, FOBT, iron studies (ferritin, TIBC, reticulocyte) ordered -Nutrition Consult, will appreciate recs -Supplemental Protein Shakes -Multi-Vitamin -  PT/OT  #Rash: Improving. Could be related to systemic inflammatory disease or a component of acute viral process, has had improvement with triamcinolone in the past. -Continue triamcinolone PRN  #FEN/GI -Diet: Regular w/ supplemental shakes  -Fluids: None  #DVT prophylaxis - Lovenox 40 mg subq injections daily   #CODE STATUS: FULL  Dispo: Pending medical course  Earlene Plater, MD Internal Medicine, PGY1 Pager: [phone]  11/29/2019,8:15 AM

## 2019-11-30 DIAGNOSIS — D61818 Other pancytopenia: Secondary | ICD-10-CM

## 2019-11-30 LAB — CBC
HCT: 30 % — ABNORMAL LOW (ref 39.0–52.0)
HCT: 29.7 % — ABNORMAL LOW (ref 39.0–52.0)
Hemoglobin: 9.5 g/dL — ABNORMAL LOW (ref 13.0–17.0)
Hemoglobin: 9.4 g/dL — ABNORMAL LOW (ref 13.0–17.0)
MCH: 29.1 pg (ref 26.0–34.0)
MCH: 29 pg (ref 26.0–34.0)
MCHC: 31.7 g/dL (ref 30.0–36.0)
MCHC: 31.6 g/dL (ref 30.0–36.0)
MCV: 91.7 fL (ref 80.0–100.0)
MCV: 91.7 fL (ref 80.0–100.0)
Platelets: 116 10*3/uL — ABNORMAL LOW (ref 150–400)
Platelets: 132 10*3/uL — ABNORMAL LOW (ref 150–400)
RBC: 3.27 MIL/uL — ABNORMAL LOW (ref 4.22–5.81)
RBC: 3.24 MIL/uL — ABNORMAL LOW (ref 4.22–5.81)
RDW: 16.9 % — ABNORMAL HIGH (ref 11.5–15.5)
RDW: 16.9 % — ABNORMAL HIGH (ref 11.5–15.5)
WBC: 1.9 10*3/uL — ABNORMAL LOW (ref 4.0–10.5)
WBC: 1.8 10*3/uL — ABNORMAL LOW (ref 4.0–10.5)
nRBC: 0 % (ref 0.0–0.2)
nRBC: 0 % (ref 0.0–0.2)

## 2019-11-30 LAB — BASIC METABOLIC PANEL
Anion gap: 7 (ref 5–15)
BUN: 8 mg/dL (ref 6–20)
CO2: 21 mmol/L — ABNORMAL LOW (ref 22–32)
Calcium: 8.1 mg/dL — ABNORMAL LOW (ref 8.9–10.3)
Chloride: 108 mmol/L (ref 98–111)
Creatinine, Ser: 0.69 mg/dL (ref 0.61–1.24)
GFR calc Af Amer: >60 mL/min (ref >60)
GFR calc non Af Amer: >60 mL/min (ref >60)
Glucose, Bld: 90 mg/dL (ref 70–99)
Potassium: 4.6 mmol/L (ref 3.5–5.1)
Sodium: 136 mmol/L (ref 135–145)

## 2019-11-30 LAB — DIFFERENTIAL
Abs Immature Granulocytes: 0 10*3/uL (ref 0.00–0.07)
Band Neutrophils: 3 %
Basophils Absolute: 0 10*3/uL (ref 0.0–0.1)
Basophils Relative: 0 %
Eosinophils Absolute: 0 10*3/uL (ref 0.0–0.5)
Eosinophils Relative: 0 %
Lymphocytes Relative: 19 %
Lymphs Abs: 0.3 10*3/uL — ABNORMAL LOW (ref 0.7–4.0)
Monocytes Absolute: 0 10*3/uL — ABNORMAL LOW (ref 0.1–1.0)
Monocytes Relative: 2 %
Neutro Abs: 1.4 10*3/uL — ABNORMAL LOW (ref 1.7–7.7)
Neutrophils Relative %: 76 %

## 2019-11-30 LAB — FERRITIN: Ferritin: 2217 ng/mL — ABNORMAL HIGH (ref 24–336)

## 2019-11-30 LAB — HAPTOGLOBIN: Haptoglobin: 152 mg/dL (ref 17–317)

## 2019-11-30 MED ORDER — LORAZEPAM 2 MG/ML IJ SOLN
0.5000 mg | INTRAMUSCULAR | Status: DC | PRN
  Administered 2019-12-02: 08:00:00 0.5 mg via INTRAVENOUS
  Filled 2019-11-30: qty 1

## 2019-11-30 MED ORDER — ENSURE ENLIVE PO LIQD
237.0000 mL | Freq: Three times a day (TID) | ORAL | Status: DC
  Administered 2019-12-02: 12:00:00 237 mL via ORAL

## 2019-11-30 NOTE — Progress Notes (Addendum)
Subjective:   Pt seen at the bedside this AM.  Patient states he just use the restroom with the help of a provider and he is winded because of it.  We discussed the need to get the bone marrow biopsy and the patient is in agreement.  Asks if he can have something to ease things anxiety prior to bone marrow biopsy.  We told the patient yes.  No other complaints at this time.  Note, the patient has told the nurse that he would prefer vanilla flavored shakes with his meals.  Objective: CBC Latest Ref Rng & Units 11/30/2019 11/30/2019 11/29/2019  WBC 4.0 - 10.5 K/uL 1.8(L) 1.9(L) 2.1(L)  Hemoglobin 13.0 - 17.0 g/dL 9.4(L) 9.5(L) 9.1(L)  Hematocrit 39.0 - 52.0 % 29.7(L) 30.0(L) 29.5(L)  Platelets 150 - 400 K/uL 132(L) 116(L) 102(L)   BMP Latest Ref Rng & Units 11/30/2019 11/29/2019 11/28/2019  Glucose 70 - 99 mg/dL 90 106(H) 112(H)  BUN 6 - 20 mg/dL '8 9 8  ' Creatinine 0.61 - 1.24 mg/dL 0.69 0.68 0.76  Sodium 135 - 145 mmol/L 136 138 137  Potassium 3.5 - 5.1 mmol/L 4.6 4.1 3.6  Chloride 98 - 111 mmol/L 108 108 108  CO2 22 - 32 mmol/L 21(L) 24 23  Calcium 8.9 - 10.3 mg/dL 8.1(L) 8.1(L) 8.2(L)   Vital signs in last 24 hours: Vitals:   11/29/19 2319 11/30/19 0500 11/30/19 0606 11/30/19 1029  BP: 106/75  116/88 110/83  Pulse: 85  84 90  Resp:    18  Temp: 98.1 F (36.7 C)  98.2 F (36.8 C) 97.6 F (36.4 C)  TempSrc: Oral  Oral Oral  SpO2: 98%  100% 96%  Weight:  101.1 kg    Height:       Physical Exam: General: Resting in bed comfortably HEENT: NCAT CV: RRR, normal S1-S2 no murmurs rubs or gallops appreciated PULM: Clear to auscultation bilaterally, no crackles or wheezes ABD: Soft and nontender in all quadrants NERUO: Alert and oriented, no focal deficits  Assessment/Plan:  Principal Problem:   Protein calorie malnutrition (HCC) Active Problems:   Physical deconditioning   Early satiety   Leukopenia   Normocytic anemia   Protein-calorie malnutrition, severe  In summary, Mr.  Sharpley is a 39 year old male with past medical history significant for HTN who presents with 3 months of anorexia, weight loss, fatigue, intermittent joint pain responsive to steroids and NSAIDs, and a rash on his fingers his eyelids and abdomen. The patient continues to have pancytopenia that has steadily worsened over the last few days.  IR has been consulted to form a bone marrow biopsy, which the patient is amendable to.  #Anorexia #Fatigue #Hx Joint pain #Pancytopenia: Patient has had several months of deceased appetite and early setiety leading to decreased PO intake and weight loss. This has contributed to his ongoing fatigue, weakness, and deconditioning. He has been spending most of his time in bed for the past month or so. Gastric emptying study normal.  Extensive infectious work-up has been negative to this point.  Lyme disease antibodies pending currently.  Pancytopenia has worsened daily since admission, which is concerning for myelodysplastic syndrome. -IR has seen the patient, and will perform the bone marrow biopsy tomorrow on 1/4. -Nutrition Consult, will appreciate recs -Supplemental Protein Shakes, d/c Boost and start Ensure vanilla shakes -Multi-Vitamin -PT/OT  #Rash: Improving. Could be related to systemic inflammatory disease or a component of acute viral process, has had improvement with triamcinolone in the past. -Continue  triamcinolone PRN  #FEN/GI -Diet: Regular w/ supplemental shakes, NPO at midnight -Fluids: None  #DVT prophylaxis - Lovenox 40 mg subq injections daily   #CODE STATUS: FULL  Dispo: Pending medical course  Earlene Plater, MD Internal Medicine, PGY1 Pager: [phone]  11/30/2019,12:15 PM

## 2019-11-30 NOTE — Progress Notes (Signed)
Occupational Therapy Treatment Patient Details Name: Andrew Byrd MRN: [id number] DOB: 1981/08/03 Today's Date: 11/30/2019    History of present illness Pt is 39 yo male with Hx of HTN presenting with 3 months of decreased decreased appetite, fatigue, and weight loss. Noted Internal Medicine has recommended bone marrow biopsy due to pancytopenia but pt is hesitant.   OT comments  Pt. Seen for skilled OT treatment session.  Agreeable to participation.  Able to provide demo and initial education for use of A/E during LB adls.  Pt. Fatigued from using b.room prior to A/E demo so unable to fully return demo but expresses interest to try at next session. Notable fatigue from toileting and hygiene/pericare.  May consider A/E aides for toileting as needed as well.  Provide examples and cont. A/E training next session in conjunction with energy conservation strategies.    Follow Up Recommendations  No OT follow up    Equipment Recommendations       Recommendations for Other Services      Precautions / Restrictions Precautions Precautions: None       Mobility Bed Mobility Overal bed mobility: Modified Independent                Transfers   Equipment used: None;Rolling walker (2 wheeled)             General transfer comment: utilized rw during pericare in b.room but states he did not need it for ambulation    Balance                                           ADL either performed or assessed with clinical judgement   ADL Overall ADL's : Needs assistance/impaired             Lower Body Bathing: Cueing for compensatory techniques;Cueing for sequencing;With adaptive equipment Lower Body Bathing Details (indicate cue type and reason): provided demo and explanation of use of LH sponge as part of A/E kit for LB adls     Lower Body Dressing: Maximal assistance;Sit to/from stand;Cueing for sequencing;With adaptive equipment Lower Body Dressing Details  (indicate cue type and reason): provided demo of use of sock aide and reacher for don/doff socks limited particiaption of return demo secondary to vocalized and notable physical fatigue following lengthy BM episode prior to A/E session       Toileting - Clothing Manipulation Details (indicate cue type and reason): pt. is independent could however benefit from cont. education on energy conservation strategies for peri care following BM including possible introduction to tongs or other modalities for energy conservation and safety.  pt. has a very specific way he performs his care following a BM that is a littel more challenging now that he is fatgiguing easily.  pt. prefers to sit and wipe post. to ant., is unable due to current bathroom set up. provided modifications and set up ideas that he was originially not wanting to try then agreeable to.  ie: stand with ue support on walker and attempt his post. to ant. approach.  he will not try reaching around to post. despite encouragement.  rec. wet flushable wipes or wet wash clothes.  he did not like this idea either.  is concerned with hand hygiene and how he would manage the wet wash clothes.  eventually tried standing with partial knee bend to access the way he preferred.  this  took some time for desired thoroughness and aided in increased fatigue.  pt. declined use of rw despite being sob.  also did need help pulling up underwear and shorts but states he does not usually need this amount of support.     Functional mobility during ADLs: Independent General ADL Comments: managing adls but will benefit from return demo of a/e for lb ease.  also attempt introduction of any toileting aides or modifications to ease the process of care following BM.  (see above)     Vision       Perception     Praxis      Cognition Arousal/Alertness: Awake/alert   Overall Cognitive Status: Within Functional Limits for tasks assessed                                           Exercises     Shoulder Instructions       General Comments      Pertinent Vitals/ Pain       Pain Assessment: No/denies pain  Home Living                                          Prior Functioning/Environment              Frequency  Min 2X/week        Progress Toward Goals  OT Goals(current goals can now be found in the care plan section)  Progress towards OT goals: Progressing toward goals     Plan      Co-evaluation                 AM-PAC OT "6 Clicks" Daily Activity     Outcome Measure   Help from another person eating meals?: None Help from another person taking care of personal grooming?: None Help from another person toileting, which includes using toliet, bedpan, or urinal?: None Help from another person bathing (including washing, rinsing, drying)?: A Lot Help from another person to put on and taking off regular upper body clothing?: None Help from another person to put on and taking off regular lower body clothing?: A Lot 6 Click Score: 20    End of Session    OT Visit Diagnosis: Muscle weakness (generalized) (M62.81)   Activity Tolerance Patient tolerated treatment well   Patient Left in bed;with call bell/phone within reach   Nurse Communication          Time: 5400-8676 OT Time Calculation (min): 35 min  Charges: OT General Charges $OT Visit: 1 Visit OT Treatments $Self Care/Home Management : 23-37 mins  Andrew Byrd, M 11/30/2019, 11:42 AM

## 2019-11-30 NOTE — Progress Notes (Signed)
Patient declined boost due to chocolate flavor. RN message pharmacy for vanilla flavor but pharmacy only produce chocolate flavor boost at this time.  Sim Boast, RN

## 2019-11-30 NOTE — Consult Note (Signed)
Chief Complaint: Patient was seen in consultation today for pancytopenia/bone marrow biopsy and aspiration.  Referring Physician(s): Lucious Groves  Supervising Physician: Sandi Mariscal  Patient Status: [facility] - In-pt  History of Present Illness: Gregory Barrick is a 39 y.o. male with a past medical history of hypertension. He presented to [facility] ED 11/25/2019 with complaints of weight loss, decreased appetite, and fatigue. He was admitted for malnutrition/deconditioning. In addition, was found to be anemic with leukopenia on admission. During hospitalization, patient demonstrated pancytopenia on work-up without known cause.  IR consulted by Dr. Heber Canby for possible image-guided bone marrow biopsy/aspiration to evaluate cause of pancytopenia. Patient awake and alert laying in bed. Complains of fatigue, stable since admission. Denies fever, chills, chest pain, dyspnea, abdominal pain, or headache.   Past Medical History:  Diagnosis Date  . Hypertension     History reviewed. No pertinent surgical history.  Allergies: Other and Penicillins  Medications: Prior to Admission medications   Medication Sig Start Date End Date Taking? Authorizing Provider  amLODipine (NORVASC) 5 MG tablet Take 1 tablet (5 mg total) by mouth daily. Patient not taking: Reported on 11/25/2019 08/08/19   Isla Pence, MD  fluticasone [facility]) 50 MCG/ACT nasal spray Place 2 sprays into both nostrils daily. Patient not taking: Reported on 11/25/2019 01/30/19   Molpus, Jenny Reichmann, MD  naproxen (NAPROSYN) 500 MG tablet Take 1 tablet (500 mg total) by mouth 2 (two) times daily. Patient not taking: Reported on 11/25/2019 08/08/19   Isla Pence, MD  predniSONE (STERAPRED UNI-PAK 21 TAB) 10 MG (21) TBPK tablet Take 6 tabs for 2 days, then 5 for 2 days, then 4 for 2 days, then 3 for 2 days, 2 for 2 days, then 1 for 2 days Patient not taking: Reported on 11/25/2019 08/08/19   Isla Pence, MD  triamcinolone cream  (KENALOG) 0.1 % Apply 1 application topically 2 (two) times daily. Patient not taking: Reported on 11/25/2019 07/27/19   Blanchie Dessert, MD     Family History  Problem Relation Age of Onset  . Hypertension Other     Social History   Socioeconomic History  . Marital status: Single    Spouse name: Not on file  . Number of children: Not on file  . Years of education: Not on file  . Highest education level: Not on file  Occupational History  . Not on file  Tobacco Use  . Smoking status: Former Research scientist (life sciences)  . Smokeless tobacco: Never Used  Substance and Sexual Activity  . Alcohol use: No  . Drug use: Yes    Types: Marijuana  . Sexual activity: Not on file  Other Topics Concern  . Not on file  Social History Narrative  . Not on file   Social Determinants of Health   Financial Resource Strain:   . Difficulty of Paying Living Expenses: Not on file  Food Insecurity:   . Worried About Charity fundraiser in the Last Year: Not on file  . Ran Out of Food in the Last Year: Not on file  Transportation Needs:   . Lack of Transportation (Medical): Not on file  . Lack of Transportation (Non-Medical): Not on file  Physical Activity:   . Days of Exercise per Week: Not on file  . Minutes of Exercise per Session: Not on file  Stress:   . Feeling of Stress : Not on file  Social Connections:   . Frequency of Communication with Friends and Family: Not on file  . Frequency  of Social Gatherings with Friends and Family: Not on file  . Attends Religious Services: Not on file  . Active Member of Clubs or Organizations: Not on file  . Attends Archivist Meetings: Not on file  . Marital Status: Not on file     Review of Systems: A 12 point ROS discussed and pertinent positives are indicated in the HPI above.  All other systems are negative.  Review of Systems  Constitutional: Negative for chills and fever.  Respiratory: Negative for shortness of breath and wheezing.     Cardiovascular: Negative for chest pain and palpitations.  Gastrointestinal: Negative for abdominal pain.  Neurological: Negative for headaches.  Psychiatric/Behavioral: Negative for behavioral problems and confusion.    Vital Signs: BP 110/83 (BP Location: Left Arm)   Pulse 90   Temp 97.6 F (36.4 C) (Oral)   Resp 18   Ht '5\' 11"'  (1.803 m)   Wt 222 lb 14.2 oz (101.1 kg)   SpO2 96%   BMI 31.09 kg/m   Physical Exam Vitals and nursing note reviewed.  Constitutional:      General: He is not in acute distress.    Appearance: Normal appearance.  Cardiovascular:     Rate and Rhythm: Normal rate and regular rhythm.     Heart sounds: Normal heart sounds. No murmur.  Pulmonary:     Effort: Pulmonary effort is normal. No respiratory distress.     Breath sounds: Normal breath sounds. No wheezing.  Skin:    General: Skin is warm and dry.  Neurological:     Mental Status: He is alert and oriented to person, place, and time.      MD Evaluation Airway: WNL Heart: WNL Abdomen: WNL Chest/ Lungs: WNL ASA  Classification: 2 Mallampati/Airway Score: One   Imaging: DG Chest 2 View  Result Date: 11/24/2019 CLINICAL DATA:  Shortness of breath. EXAM: CHEST - 2 VIEW COMPARISON:  CT chest/abdomen/pelvis 08/04/2019, chest radiograph 08/04/2019 FINDINGS: Heart size within normal limits. Chronic elevation of the left hemidiaphragm. No airspace consolidation. No evidence of pleural effusion or pneumothorax. No acute bony abnormality. Nonspecific air distention of the colon within the partially imaged upper abdomen. IMPRESSION: No evidence of acute cardiopulmonary abnormality. Nonspecific air distention of the colon within the partially imaged upper abdomen. Electronically Signed   By: Kellie Simmering DO   On: 11/24/2019 17:20   CT Angio Chest PE W and/or Wo Contrast  Result Date: 11/25/2019 CLINICAL DATA:  Shortness of breath for 1 month. Recent weight loss. Decreased appetite. EXAM: CT  ANGIOGRAPHY CHEST WITH CONTRAST TECHNIQUE: Multidetector CT imaging of the chest was performed using the standard protocol during bolus administration of intravenous contrast. Multiplanar CT image reconstructions and MIPs were obtained to evaluate the vascular anatomy. CONTRAST:  117m OMNIPAQUE IOHEXOL 350 MG/ML SOLN COMPARISON:  Chest CTA 08/04/2019. Performed in conjunction with CT of the abdomen/pelvis, reported separately. FINDINGS: Cardiovascular: There are no filling defects within the pulmonary arteries to suggest pulmonary embolus. The thoracic aorta is normal in caliber. Heart is normal in size. No pericardial effusion. Mediastinum/Nodes: No enlarged mediastinal or hilar lymph nodes. Subcentimeter hypodense left thyroid nodules only partially included in the field of view. No esophageal wall thickening. Lungs/Pleura: Previous right upper lobe ground-glass opacity has resolved. No new airspace disease. Subsegmental linear atelectasis in the lingula. Previous left pleural effusion has resolved. No pulmonary edema. Trachea and mainstem bronchi are patent. Upper Abdomen: Assessed on dedicated abdominopelvic CT, performed concurrently and reported separately. Musculoskeletal: There  are no acute or suspicious osseous abnormalities. Nodular density in the subcutaneous tissues of the left posterior chest wall, series 5, image 52, is not significantly changed from prior. Review of the MIP images confirms the above findings. IMPRESSION: 1. No pulmonary embolus or acute intrathoracic abnormality. 2. The right upper lobe 8 mm ground-glass nodular opacity and left pleural effusion on chest CT 3 months ago have resolved. 3. Subcutaneous density in the left posterior chest wall is unchanged from prior, likely focal area of scarring or sebaceous cyst, but nonspecific in CT appearance. Electronically Signed   By: Keith Rake M.D.   On: 11/25/2019 05:31   NM GASTRIC EMPTYING  Result Date: 11/27/2019 CLINICAL DATA:   Early satiety EXAM: NUCLEAR MEDICINE GASTRIC EMPTYING SCAN TECHNIQUE: After oral ingestion of radiolabeled meal, sequential abdominal images were obtained for 4 hours. Percentage of activity emptying the stomach was calculated at 1 hour, 2 hour, 3 hour, and 4 hours. RADIOPHARMACEUTICALS:  1.9 mCi Tc-36msulfur colloid in standardized meal including a COMPARISON:  None. FINDINGS: Expected location of the stomach in the left upper quadrant. Ingested meal empties the stomach gradually over the course of the study. 65.2% emptied at 1 hr ( normal >= 10%) 73.9% emptied at 2 hr ( normal >= 40%) 90.7% emptied at 3 hr ( normal >= 70%) 96.3% emptied at 4 hr ( normal >= 90%) IMPRESSION: Normal gastric emptying study. Electronically Signed   By: WLowella GripIII M.D.   On: 11/27/2019 16:08   CT ABDOMEN PELVIS W CONTRAST  Result Date: 11/25/2019 CLINICAL DATA:  Abdominal pain, unspecified. Shortness of breath for 1 month. Recent weight loss with no appetite. EXAM: CT ABDOMEN AND PELVIS WITH CONTRAST TECHNIQUE: Multidetector CT imaging of the abdomen and pelvis was performed using the standard protocol following bolus administration of intravenous contrast. CONTRAST:  1063mOMNIPAQUE IOHEXOL 350 MG/ML SOLN COMPARISON:  Performed in conjunction with chest CTA, reported separately. Chest, abdomen, and pelvis CTA 08/04/2019 FINDINGS: Lower chest: Assessed on concurrent chest CT, reported separately. Hepatobiliary: Diffusely decreased hepatic density consistent with steatosis. Intraluminal gallstones without pericholecystic inflammation. No biliary dilatation. Pancreas: No ductal dilatation or inflammation. Spleen: Normal in size without focal abnormality. Adrenals/Urinary Tract: Normal adrenal glands. No hydronephrosis or perinephric edema. Homogeneous renal enhancement with symmetric excretion on delayed phase imaging. Urinary bladder is physiologically distended without wall thickening. Stomach/Bowel: Bowel evaluation  is limited in the absence of enteric contrast. Stomach is nondistended. No gastric wall thickening. No small bowel inflammation or obstruction. Normal appendix. Small volume of stool in the ascending colon. Slight gaseous distension of transverse colon. Descending and sigmoid colon are nondistended. No evidence of colonic wall thickening or colonic mass on CT. Vascular/Lymphatic: Normal caliber abdominal aorta. Portal vein is patent. No adenopathy. Reproductive: Prostate is unremarkable. Other: Small fat containing periumbilical hernia. No ascites or free air. No evidence of intra-abdominal mass. Musculoskeletal: There are no acute or suspicious osseous abnormalities. IMPRESSION: 1. No acute abnormality or explanation for abdominal pain or weight loss. 2. Hepatic steatosis. Cholelithiasis without gallbladder inflammation. 3. Small fat containing periumbilical hernia. Electronically Signed   By: MeKeith Rake.D.   On: 11/25/2019 05:39    Labs:  CBC: Recent Labs    11/27/19 0538 11/28/19 0830 11/29/19 0455 11/30/19 0606  WBC 2.2* 2.0* 2.1* 1.8*  1.9*  HGB 9.8* 9.8* 9.1* 9.4*  9.5*  HCT 31.2* 31.6* 29.5* 29.7*  30.0*  PLT 136* 123* 102* 132*  116*    COAGS: No results  for input(s): INR, APTT in the last 8760 hours.  BMP: Recent Labs    11/27/19 0538 11/28/19 0830 11/29/19 0455 11/30/19 0606  NA 139 137 138 136  K 3.0* 3.6 4.1 4.6  CL 107 108 108 108  CO2 '23 23 24 ' 21*  GLUCOSE 98 112* 106* 90  BUN '9 8 9 8  ' CALCIUM 8.4* 8.2* 8.1* 8.1*  CREATININE 0.73 0.76 0.68 0.69  GFRNONAA >60 >60 >60 >60  GFRAA >60 >60 >60 >60    LIVER FUNCTION TESTS: Recent Labs    11/25/19 0422 11/26/19 0719 11/29/19 0455  BILITOT 1.4* 1.3* 0.7  AST 59* 56* 56*  ALT 56* 50* 52*  ALKPHOS 81 74 68  PROT 6.6 6.4* 5.2*  ALBUMIN 2.7* 2.5* 2.1*     Assessment and Plan:  Pancytopenia. Plan for image-guided bone marrow biopsy/aspiration tentatively for tomorrow in IR. Patient will be NPO  at midnight. Afebrile. He does not take blood thinners. INR/CBC with diff pending for 0500 tomorrow AM.  Risks and benefits discussed with the patient including, but not limited to bleeding, infection, damage to adjacent structures or low yield requiring additional tests. All of the patient's questions were answered, patient is agreeable to proceed. Consent signed and in chart.   Thank you for this interesting consult.  I greatly enjoyed meeting Bhavya Grand and look forward to participating in their care.  A copy of this report was sent to the requesting provider on this date.  Electronically Signed: Earley Abide, PA-C 11/30/2019, 11:23 AM   I spent a total of 40 Minutes in face to face in clinical consultation, greater than 50% of which was counseling/coordinating care for pancytopenia/bone marrow biopsy and aspiration.

## 2019-12-01 DIAGNOSIS — R21 Rash and other nonspecific skin eruption: Secondary | ICD-10-CM

## 2019-12-01 DIAGNOSIS — R112 Nausea with vomiting, unspecified: Secondary | ICD-10-CM

## 2019-12-01 DIAGNOSIS — D61818 Other pancytopenia: Principal | ICD-10-CM

## 2019-12-01 LAB — CBC WITH DIFFERENTIAL/PLATELET
Abs Immature Granulocytes: 0.01 10*3/uL (ref 0.00–0.07)
Basophils Absolute: 0 10*3/uL (ref 0.0–0.1)
Basophils Relative: 0 %
Eosinophils Absolute: 0 10*3/uL (ref 0.0–0.5)
Eosinophils Relative: 2 %
HCT: 29.9 % — ABNORMAL LOW (ref 39.0–52.0)
Hemoglobin: 9.6 g/dL — ABNORMAL LOW (ref 13.0–17.0)
Immature Granulocytes: 1 %
Lymphocytes Relative: 24 %
Lymphs Abs: 0.5 10*3/uL — ABNORMAL LOW (ref 0.7–4.0)
MCH: 29.4 pg (ref 26.0–34.0)
MCHC: 32.1 g/dL (ref 30.0–36.0)
MCV: 91.7 fL (ref 80.0–100.0)
Monocytes Absolute: 0.2 10*3/uL (ref 0.1–1.0)
Monocytes Relative: 9 %
Neutro Abs: 1.2 10*3/uL — ABNORMAL LOW (ref 1.7–7.7)
Neutrophils Relative %: 64 %
Platelets: 119 10*3/uL — ABNORMAL LOW (ref 150–400)
RBC: 3.26 MIL/uL — ABNORMAL LOW (ref 4.22–5.81)
RDW: 16.8 % — ABNORMAL HIGH (ref 11.5–15.5)
WBC: 1.9 10*3/uL — ABNORMAL LOW (ref 4.0–10.5)
nRBC: 0 % (ref 0.0–0.2)

## 2019-12-01 LAB — HEPATIC FUNCTION PANEL
ALT: 55 U/L — ABNORMAL HIGH (ref 0–44)
AST: 52 U/L — ABNORMAL HIGH (ref 15–41)
Albumin: 2.1 g/dL — ABNORMAL LOW (ref 3.5–5.0)
Alkaline Phosphatase: 72 U/L (ref 38–126)
Bilirubin, Direct: 0.2 mg/dL (ref 0.0–0.2)
Indirect Bilirubin: 0.6 mg/dL (ref 0.3–0.9)
Total Bilirubin: 0.8 mg/dL (ref 0.3–1.2)
Total Protein: 5.2 g/dL — ABNORMAL LOW (ref 6.5–8.1)

## 2019-12-01 LAB — PROTIME-INR
INR: 1.1 (ref 0.8–1.2)
Prothrombin Time: 13.7 seconds (ref 11.4–15.2)

## 2019-12-01 LAB — B. BURGDORFI ANTIBODIES: B burgdorferi Ab IgG+IgM: <0.91 {ISR} (ref 0.00–0.90)

## 2019-12-01 MED ORDER — ONDANSETRON HCL 4 MG/2ML IJ SOLN
4.0000 mg | INTRAMUSCULAR | Status: DC | PRN
  Administered 2019-12-01: 10:00:00 4 mg via INTRAVENOUS
  Filled 2019-12-01: qty 2

## 2019-12-01 MED ORDER — PROMETHAZINE HCL 25 MG/ML IJ SOLN
12.5000 mg | INTRAMUSCULAR | Status: DC | PRN
  Administered 2019-12-01: 13:00:00 12.5 mg via INTRAVENOUS
  Filled 2019-12-01: qty 1

## 2019-12-01 NOTE — Progress Notes (Signed)
Subjective: HD#6   Overnight: No acute overnight events. Bone marrow biopsy postponed to 1/5 due to IR schedule and emergencies   Today, Andrew Byrd stated that he is hungry and has not eaten for 2 days. His meals yesterday were cold and had to throw them away. He has also been NPO. Due to his hunger, he reports that he feels his appetite is reverting back to where it was prior to admission to the hospital. He has had several episodes of emesis which is "green in color" but denies abdominal pain pain. He also complains of headache and rates it at 6/10 which he states is related to his hunger.   Objective:  Vital signs in last 24 hours: Vitals:   11/30/19 1029 11/30/19 1526 11/30/19 2316 12/01/19 0358  BP: 110/83 (!) 120/91 111/78 121/83  Pulse: 90 82 77 76  Resp: '18 18 18 18  ' Temp: 97.6 F (36.4 C) 98.1 F (36.7 C) 97.9 F (36.6 C) 98.2 F (36.8 C)  TempSrc: Oral Oral Oral Oral  SpO2: 96% 100% 100% 100%  Weight:    101.7 kg  Height:       Const: In no apparent distress Abd: Bowel sounds present, nondistended, nontender to palpation  Assessment/Plan:  Principal Problem:   Protein calorie malnutrition (HCC) Active Problems:   Physical deconditioning   Early satiety   Leukopenia   Normocytic anemia   Protein-calorie malnutrition, severe   Pancytopenia (HCC)  In summary, Andrew Byrd is a 39 year old male with past medical history significant for HTN who presents with 3 months of anorexia, weight loss, fatigue, intermittent joint pain responsive to steroids and NSAIDs, and a rash on his fingers his eyelids and abdomen. The patient continues to have pancytopenia that has steadily worsened over the last few days.  IR has been consulted to form a bone marrow biopsy, which the patient is amendable to.  #Anorexia #Fatigue #Hx Joint pain #Pancytopenia: Patient has had several months of deceased appetiteand early setietyleading to decreased PO intake and weight loss. This has  contributed to his ongoing fatigue, weakness, and deconditioning. He has been spending most of his time in bed for the past month or so. Gastric emptying study normal.  Extensive infectious work-up has been negative to this point.  Lyme disease antibodies pending currently.  Pancytopenia has worsened daily since admission, which is concerning for myelodysplastic syndrome. -IR CT guided bone marrow biopsy postponed to 1/5 -Nutrition Consult, will appreciate recs -Supplemental Protein Shakes, d/c Boost and start Ensure vanilla shakes -Multi-Vitamin -PT/OT  #Emesis/Nausea: He reports of a 1 day history of bilious emesis and states that he has not eaten in 2 days due to being NPO and food being cold. He denies abdominal pain. He has chronically elevated liver enzymes however workup has been unremarkable. Initially, T. Bili was elevated but had resolved. CT abdomen and pelvis on admission showed possible hepatic steatosis, Intraluminal gallstones without pericholecystic inflammation without evidence of biliary dilatation.  -F/u LFTs this am -If nausea and emesis persists, will obtain Abdominal ultrasound to rule of underlying hepatobiliary insult  #Rash:Improving. Could be related to systemic inflammatory disease or a component of acute viral process, has had improvement with triamcinolone in the past. -Continue triamcinolone PRN  #FEN/GI -Diet: Regular w/ supplemental shakes, NPO at midnight -Fluids: None  #DVT prophylaxis - Lovenox 40 mg subq injections daily   #CODE STATUS: FULL  Dispo: Pending medical course   Jean Rosenthal, MD 12/01/2019, 10:05 AM Pager: [phone] Internal Medicine  Teaching Service

## 2019-12-01 NOTE — Plan of Care (Signed)
  Problem: Clinical Measurements: Goal: Ability to maintain clinical measurements within normal limits will improve Outcome: Progressing   

## 2019-12-01 NOTE — Progress Notes (Signed)
Physical Therapy Treatment Patient Details Name: Andrew Byrd MRN: [id number] DOB: [birth year] Today's Date: 12/01/2019    History of Present Illness Pt is 39 yo male with Hx of HTN presenting with 3 months of decreased decreased appetite, fatigue, and weight loss. Noted Internal Medicine has recommended bone marrow biopsy due to pancytopenia but pt is hesitant.    PT Comments    Pt was seen for gait but declined, and was seen for there ex as agreed.   He has been up walking a bit today, but notes N&V today.  Will focus on his acute therapy goals and progress with gait and balance skills as tolerated.    Follow Up Recommendations  Outpatient PT     Equipment Recommendations  None recommended by PT    Recommendations for Other Services       Precautions / Restrictions Precautions Precautions: None Restrictions Weight Bearing Restrictions: No    Mobility  Bed Mobility Overal bed mobility: Modified Independent             General bed mobility comments: declined OOB to chair  Transfers                 General transfer comment: declined  Ambulation/Gait                 Stairs             Wheelchair Mobility    Modified Rankin (Stroke Patients Only)       Balance Overall balance assessment: Needs assistance Sitting-balance support: No upper extremity supported;Feet unsupported Sitting balance-Leahy Scale: Normal     Standing balance support: No upper extremity supported;During functional activity Standing balance-Leahy Scale: Good                              Cognition Arousal/Alertness: Awake/alert Behavior During Therapy: Flat affect Overall Cognitive Status: Within Functional Limits for tasks assessed                                        Exercises General Exercises - Lower Extremity Ankle Circles/Pumps: AROM;5 reps Quad Sets: AROM;10 reps Gluteal Sets: AROM;10 reps Heel Slides: AROM;10 reps Hip  ABduction/ADduction: AROM;10 reps Straight Leg Raises: AROM;10 reps    General Comments General comments (skin integrity, edema, etc.): complaining about nausea and fatigue, declines gait but did do strengthening ex's to LE's       Pertinent Vitals/Pain Pain Assessment: Faces Faces Pain Scale: Hurts little more Pain Location: abdomen    Home Living                      Prior Function            PT Goals (current goals can now be found in the care plan section) Acute Rehab PT Goals Patient Stated Goal: to go home Time For Goal Achievement: 12/09/19 Progress towards PT goals: Progressing toward goals    Frequency    Min 3X/week      PT Plan Current plan remains appropriate    Co-evaluation              AM-PAC PT "6 Clicks" Mobility   Outcome Measure  Help needed turning from your back to your side while in a flat bed without using bedrails?: None Help needed moving from lying on your back  to sitting on the side of a flat bed without using bedrails?: None Help needed moving to and from a bed to a chair (including a wheelchair)?: A Little Help needed standing up from a chair using your arms (e.g., wheelchair or bedside chair)?: A Little Help needed to walk in hospital room?: A Little Help needed climbing 3-5 steps with a railing? : A Little 6 Click Score: 20    End of Session   Activity Tolerance: Patient limited by fatigue Patient left: in bed;with call bell/phone within reach Nurse Communication: Mobility status PT Visit Diagnosis: Unsteadiness on feet (R26.81);Other abnormalities of gait and mobility (R26.89);Muscle weakness (generalized) (M62.81)     Time: 3953-2023 PT Time Calculation (min) (ACUTE ONLY): 26 min  Charges:  $Therapeutic Exercise: 8-22 mins $Therapeutic Activity: 8-22 mins       Ramond Dial 12/01/2019, 7:18 PM

## 2019-12-01 NOTE — Progress Notes (Signed)
Occupational Therapy Treatment Patient Details Name: Andrew Byrd MRN: [id number] DOB: 02-16-1981 Today's Date: 12/01/2019    History of present illness Pt is 39 yo male with Hx of HTN presenting with 3 months of decreased decreased appetite, fatigue, and weight loss. Noted Internal Medicine has recommended bone marrow biopsy due to pancytopenia but pt is hesitant.   OT comments  Pt performing bed mobility to EOB with independence; mobility in room with no AD and fair balance. Pt performing grooming in standing at sink for ~5-42mns with set-upA. Pt agreeable to therapy, but could be self limiting. Pt would benefit from toilet aide and further AE education. Pt with decreased energy conservation and decreased strength. Pt would benefit from continued OT skilled services for ADL and mobility. OT following acutely.     Follow Up Recommendations  No OT follow up    Equipment Recommendations  None recommended by OT    Recommendations for Other Services      Precautions / Restrictions Precautions Precautions: None Restrictions Weight Bearing Restrictions: No       Mobility Bed Mobility Overal bed mobility: Modified Independent                Transfers Overall transfer level: Needs assistance Equipment used: None Transfers: Sit to/from Stand Sit to Stand: Min guard              Balance Overall balance assessment: Needs assistance   Sitting balance-Leahy Scale: Normal       Standing balance-Leahy Scale: Good                             ADL either performed or assessed with clinical judgement   ADL Overall ADL's : Needs assistance/impaired     Grooming: Supervision/safety;Standing                   Toilet Transfer: Supervision/safety;BSC           Functional mobility during ADLs: Supervision/safety General ADL Comments: Pt appears self limiting at times. Pt would benefit from toilet aide. Pt with decreased energy conservation and  decreased strength.     Vision       Perception     Praxis      Cognition Arousal/Alertness: Awake/alert Behavior During Therapy: Flat affect Overall Cognitive Status: Within Functional Limits for tasks assessed                                          Exercises     Shoulder Instructions       General Comments Pt nauseous and was vomitting prior to OT session. RN made aware.    Pertinent Vitals/ Pain       Pain Assessment: No/denies pain  Home Living                                          Prior Functioning/Environment              Frequency  Min 2X/week        Progress Toward Goals  OT Goals(current goals can now be found in the care plan section)  Progress towards OT goals: Progressing toward goals  Acute Rehab OT Goals Patient Stated Goal: find out why he has  no appetite; home at discharge OT Goal Formulation: With patient Time For Goal Achievement: 12/15/19 Potential to Achieve Goals: Good ADL Goals Additional ADL Goal #1: Pt will perform LB bathing and dressing modified independently with AE.  Plan Discharge plan remains appropriate    Co-evaluation                 AM-PAC OT "6 Clicks" Daily Activity     Outcome Measure   Help from another person eating meals?: None Help from another person taking care of personal grooming?: None Help from another person toileting, which includes using toliet, bedpan, or urinal?: None Help from another person bathing (including washing, rinsing, drying)?: A Lot Help from another person to put on and taking off regular upper body clothing?: None Help from another person to put on and taking off regular lower body clothing?: A Lot 6 Click Score: 20    End of Session    OT Visit Diagnosis: Muscle weakness (generalized) (M62.81)   Activity Tolerance Patient tolerated treatment well   Patient Left in bed;with call bell/phone within reach   Nurse Communication  Mobility status        Time: 5797-2820 OT Time Calculation (min): 30 min  Charges: OT General Charges $OT Visit: 1 Visit OT Treatments $Self Care/Home Management : 23-37 mins  Jefferey Pica OTR/L Acute Rehabilitation Services Pager: [phone] Office: [phone]    Terry Abila C 12/01/2019, 4:01 PM

## 2019-12-02 ENCOUNTER — Telehealth: Payer: Self-pay | Admitting: General Practice

## 2019-12-02 ENCOUNTER — Inpatient Hospital Stay (HOSPITAL_COMMUNITY): Payer: Medicaid Other

## 2019-12-02 DIAGNOSIS — Z91010 Allergy to peanuts: Secondary | ICD-10-CM

## 2019-12-02 LAB — CBC WITH DIFFERENTIAL/PLATELET
Abs Immature Granulocytes: 0 10*3/uL (ref 0.00–0.07)
Basophils Absolute: 0 10*3/uL (ref 0.0–0.1)
Basophils Relative: 0 %
Eosinophils Absolute: 0 10*3/uL (ref 0.0–0.5)
Eosinophils Relative: 0 %
HCT: 30.9 % — ABNORMAL LOW (ref 39.0–52.0)
Hemoglobin: 9.7 g/dL — ABNORMAL LOW (ref 13.0–17.0)
Lymphocytes Relative: 1 %
Lymphs Abs: 0 10*3/uL — ABNORMAL LOW (ref 0.7–4.0)
MCH: 29.1 pg (ref 26.0–34.0)
MCHC: 31.4 g/dL (ref 30.0–36.0)
MCV: 92.8 fL (ref 80.0–100.0)
Monocytes Absolute: 0.1 10*3/uL (ref 0.1–1.0)
Monocytes Relative: 4 %
Neutro Abs: 2.1 10*3/uL (ref 1.7–7.7)
Neutrophils Relative %: 95 %
Platelets: 122 10*3/uL — ABNORMAL LOW (ref 150–400)
RBC: 3.33 MIL/uL — ABNORMAL LOW (ref 4.22–5.81)
RDW: 17 % — ABNORMAL HIGH (ref 11.5–15.5)
WBC: 2.2 10*3/uL — ABNORMAL LOW (ref 4.0–10.5)
nRBC: 0 % (ref 0.0–0.2)
nRBC: 0 /100 WBC

## 2019-12-02 LAB — BASIC METABOLIC PANEL
Anion gap: 8 (ref 5–15)
BUN: 7 mg/dL (ref 6–20)
CO2: 23 mmol/L (ref 22–32)
Calcium: 8.2 mg/dL — ABNORMAL LOW (ref 8.9–10.3)
Chloride: 105 mmol/L (ref 98–111)
Creatinine, Ser: 0.66 mg/dL (ref 0.61–1.24)
GFR calc Af Amer: >60 mL/min (ref >60)
GFR calc non Af Amer: >60 mL/min (ref >60)
Glucose, Bld: 90 mg/dL (ref 70–99)
Potassium: 4.2 mmol/L (ref 3.5–5.1)
Sodium: 136 mmol/L (ref 135–145)

## 2019-12-02 LAB — PATHOLOGIST SMEAR REVIEW

## 2019-12-02 MED ORDER — LIDOCAINE HCL 1 % IJ SOLN
INTRAMUSCULAR | Status: AC
  Filled 2019-12-02: qty 20

## 2019-12-02 MED ORDER — FENTANYL CITRATE (PF) 100 MCG/2ML IJ SOLN
INTRAMUSCULAR | Status: AC | PRN
  Administered 2019-12-02 (×2): 50 ug via INTRAVENOUS

## 2019-12-02 MED ORDER — SODIUM CHLORIDE 0.9 % IV SOLN
INTRAVENOUS | Status: AC | PRN
  Administered 2019-12-02: 10 mL/h via INTRAVENOUS

## 2019-12-02 MED ORDER — FENTANYL CITRATE (PF) 100 MCG/2ML IJ SOLN
INTRAMUSCULAR | Status: AC
  Filled 2019-12-02: qty 4

## 2019-12-02 MED ORDER — MIDAZOLAM HCL 2 MG/2ML IJ SOLN
INTRAMUSCULAR | Status: AC
  Filled 2019-12-02: qty 4

## 2019-12-02 MED ORDER — MIDAZOLAM HCL 2 MG/2ML IJ SOLN
INTRAMUSCULAR | Status: AC | PRN
  Administered 2019-12-02 (×2): 1 mg via INTRAVENOUS

## 2019-12-02 NOTE — Progress Notes (Signed)
   Subjective:   Mr. Tinnell was able to obtain his bone marrow biopsy today. He states that he feels his progression is not an impressive as he would like. He lives in [place] and does not have a physician.  We told the patient he can follow-up with [place] in our outpatient clinic.  Patient is in agreement.  Objective:  Vital signs in last 24 hours: Vitals:   12/02/19 0840 12/02/19 0845 12/02/19 0850 12/02/19 0856  BP: 123/86 119/82 127/77 102/70  Pulse: 84 98 99 89  Resp: _0 Temp:      TempSrc:      SpO2: 100% 100% 100% 100%  Weight:      Height:       Physical Exam General: Laying in bed comfortably HEENT: NCAT, thin hair CV: Normal S1-S2, no murmurs rubs or gallops appreciated PULM: Clear to auscultation bilaterally ABD: Soft and nontender Neuro: Alert and oriented, no focal deficits noted  Assessment/Plan:  Principal Problem:   Pancytopenia (HCC) Active Problems:   Protein calorie malnutrition (HCC)   Physical deconditioning   Early satiety   Leukopenia   Normocytic anemia   Protein-calorie malnutrition, severe  In summary, Mr. Febles is a 39 year old male with past medical history significant for HTN who presents with 3 months of anorexia, weight loss, fatigue, intermittent joint pain responsive to steroids and NSAIDs, and a rash on his fingers his eyelids and abdomen. The patientcontinues to havepancytopenia that has steadilyworsenedover the last few days prior to admission. Patient is status post bone marrow biopsy today.  #Anorexia #Fatigue #Hx Joint pain #Pancytopenia: Patient has had several months of deceased appetiteand early setietyleading to decreased PO intake and weight loss. This has contributed to his ongoing fatigue, weakness, and deconditioning. He has been spending most of his time in bed for the past month or so.Gastric emptying study normal. Extensive infectious work-up has been negative to this point. Lyme disease antibodies pending  currently. Pancytopenia has worsened daily since admission, which is concerning for myelodysplastic syndrome. -Bone marrow biopsy performed on 1/5 -f/u in the IMTS clinic on January 19th at 10:15 AM  #Rash:Improving. Could be related to systemic inflammatory disease or a component of acute viral process, has had improvement with triamcinolone in the past. -Continue triamcinolone PRN  #FEN/GI -Diet: Regular w/ supplemental shakes,NPO at midnight -Fluids: None  #DVT prophylaxis - Lovenox 40 mg subq injections daily   #CODE STATUS: FULL  Dispo: Anticipated discharge today.  Earlene Plater, MD Internal Medicine, PGY1 Pager: [phone]  12/02/2019,11:36 AM

## 2019-12-02 NOTE — Progress Notes (Signed)
RN gave pt discharge instructions and pt stated understanding. Iv has been removed his wife stated that she was going to pick up their daughter and come back and get the patient

## 2019-12-02 NOTE — Procedures (Signed)
Interventional Radiology Procedure Note  Procedure: CT guided aspirate and core biopsy of left posterior iliac bone Complications: None Recommendations: - Bedrest supine x 1 hrs - OTC's PRN  Pain - Follow biopsy results  Signed,  Amarionna Arca S. Fardowsa Authier, DO    

## 2019-12-02 NOTE — TOC Initial Note (Addendum)
Transition of Care [facility]) - Initial/Assessment Note    Patient Details  Name: Andrew Byrd MRN: [id number] Date of Birth: 04-20-1981  [facility]) CM/SW Contact:    Kingsley Plan, RN Phone Number: 12/02/2019, 11:00 AM  Clinical Narrative:                 Patient reports he is from home with his "girl".  Discussed OP PT vs HHPT. Patient prefers HHPT. Asked if patient is going to address in Epic, he stated he is not sure. Explained unable to arrange HHPT if , don't have an address.   Patient states he will work on address.   Also patient does not have PCP which is needed for HHPT. Dr Lyn Hollingshead has agreed to be PCP , patient in agreement.   Patient able to provide an address will give referral to [facility]. Explained [facility] will ask patient questions to see if he qualifies for charity home health.   Patient voiced understanding. Bedside nurse aware.   Pharmacy changed to [facility].  Patient will be discharging to [address] [place] [address] , referral given to [facility] with [facility] , awaiting call back.Referral given to Lupita Leash with [facility]  Expected Discharge Plan: Home w Home Health Services     Patient Goals and CMS Choice Patient states their goals for this hospitalization and ongoing recovery are:: to go home CMS Medicare.gov Compare Post Acute Care list provided to:: Patient Choice offered to / list presented to : Patient  Expected Discharge Plan and Services Expected Discharge Plan: Home w Home Health Services   Discharge Planning Services: CM Consult Post Acute Care Choice: Home Health Living arrangements for the past 2 months: Single Family Home Expected Discharge Date: 12/02/19               DME Arranged: N/A                    Prior Living Arrangements/Services Living arrangements for the past 2 months: Single Family Home Lives with:: Significant Other Patient language and need for interpreter reviewed:: Yes         Need for Family Participation in Patient Care: Yes (Comment) Care giver support system in place?: Yes (comment)   Criminal Activity/Legal Involvement Pertinent to Current Situation/Hospitalization: No - Comment as needed  Activities of Daily Living      Permission Sought/Granted   Permission granted to share information with : No              Emotional Assessment   Attitude/Demeanor/Rapport: Engaged Affect (typically observed): Accepting Orientation: : Oriented to Self, Oriented to Place, Oriented to  Time, Oriented to Situation      Admission diagnosis:  Protein calorie malnutrition (HCC) [E46] Decreased appetite [R63.0] Orthostasis [I95.1] Weight loss [R63.4] General weakness [R53.1] Patient Active Problem List   Diagnosis Date Noted  . Pancytopenia (HCC)   . Protein-calorie malnutrition, severe 11/26/2019  . Protein calorie malnutrition (HCC) 11/25/2019  . Physical deconditioning 11/25/2019  . Early satiety 11/25/2019  . Leukopenia 11/25/2019  . Normocytic anemia 11/25/2019  . General weakness    PCP:  Patient, No Pcp Per Pharmacy:   [id number] - [place], [place] - [address] [place] [address] Phone: [phone] Fax: [phone]  [facility] [place], [place] - [address] [place] [address] Phone: [phone]  Fax: [phone]     Social Determinants of Health (SDOH) Interventions    Readmission Risk Interventions No flowsheet data found.

## 2019-12-02 NOTE — Telephone Encounter (Signed)
[facility] est care per Dr Lyn Hollingshead, 01/19 1015am/NW

## 2019-12-03 LAB — PATHOLOGIST SMEAR REVIEW

## 2019-12-04 NOTE — Telephone Encounter (Signed)
Attempted to speak to patient for [facility] call. Patient currently doing [facility]. Will call back in 30 minutes per his request. Kinnie Feil, BSN, RN-BC

## 2019-12-04 NOTE — Discharge Summary (Addendum)
Name: Artin Mceuen MRN: [id number] DOB: [birth year] 39 y.o. PCP: Patient, No Pcp Per  Date of Admission: 11/24/2019  4:53 PM Date of Discharge: 12/02/2019 Attending Physician: Johnnette Gourd, DO  Discharge Diagnosis: 1. Pancytopenia 2. Fatigue  Discharge Medications: Allergies as of 12/02/2019      Reactions   Other    Nut Allergy   Penicillins       Medication List    STOP taking these medications   predniSONE 10 MG (21) Tbpk tablet Commonly known as: STERAPRED UNI-PAK 21 TAB     TAKE these medications   amLODipine 5 MG tablet Commonly known as: NORVASC Take 1 tablet (5 mg total) by mouth daily.   fluticasone 50 MCG/ACT nasal spray Commonly known as: FLONASE Place 2 sprays into both nostrils daily.   naproxen 500 MG tablet Commonly known as: NAPROSYN Take 1 tablet (500 mg total) by mouth 2 (two) times daily.   triamcinolone cream 0.1 % Commonly known as: KENALOG Apply 1 application topically 2 (two) times daily.      Disposition and follow-up:   Mr.Jed Glaspy was discharged from [facility] in [place] condition.  At the hospital follow up visit please address:  1. Pancytopenia: Patient has experienced anorexia, weight loss, fatigue, hair loss, and intermittent joint pain responsive to steroids and NSAIDs as well as a rash on his fingers eyelids and abdomen for the last 3 months. Suspected myelodysplastic syndrome.  Infectious work-up unremarkable. CT angio chest and CT abdomen and pelvis unremarkable. Gastric emptying study normal. S/p bone marrow biopsy on 1/5. -Please follow up bone marrow biopsy results at follow up  -May need hematology referral  2. Fatigue -Home health PT   3.  Labs / imaging needed at time of follow-up: CBC  4.  Pending labs/ test needing follow-up: Bone Marrow Biopsy  Follow-up Appointments:   [facility] [place] [address], [place], [address] 10:15 AM   Hospital Course by problem  list: 1. Pancytopenia  In summary, Mr. Mccormac is a 39 year old male with past medical history significant for HTN who presents with 3 months of anorexia, weight loss, fatigue, intermittent joint pain responsive to steroids and NSAIDs, and a rash on his fingers his eyelids and abdomen. Patient received a CT angio chest, and a CT of the abdomen and pelvis which were both unremarkable. Gastric emptying study was unremarkable. The patienthad persistent pancytopenia that has steadilyworsenedover the days of his hospitalization. A robust infectious disease workup was ordered, including Lyme antibodies, HIV PCR, hepatitis panel, CMV PCR, EBV PCR, and syphilis antibodies which all were negative. Patient had an elevated sed rate to 57  but normal CRP. Iron studies were consistent with anemia of chronic disease. The patient received bone marrow biopsy on 12/02/2019 and tolerated the procedure well. He was discharged later that day.  He has a follow-up appointment in the internal medicine clinic on January 19 at 10:15 AM to view his bone marrow biopsy results. High suspicion for myelodysplastic syndrome and the patient may need a hematology referral.  Discharge Vitals:   BP 102/70   Pulse 89   Temp 98.8 F (37.1 C) (Oral)   Resp 16   Ht '5\' 11"'  (1.803 m)   Wt 101.7 kg   SpO2 100%   BMI 31.27 kg/m   Pertinent Labs, Studies, and Procedures:  CBC Latest Ref Rng & Units 12/02/2019 12/01/2019 11/30/2019  WBC 4.0 - 10.5 K/uL 2.2(L) 1.9(L) 1.8(L)  Hemoglobin 13.0 -  17.0 g/dL 9.7(L) 9.6(L) 9.4(L)  Hematocrit 39.0 - 52.0 % 30.9(L) 29.9(L) 29.7(L)  Platelets 150 - 400 K/uL 122(L) 119(L) 132(L)   BMP Latest Ref Rng & Units 12/02/2019 11/30/2019 11/29/2019  Glucose 70 - 99 mg/dL 90 90 106(H)  BUN 6 - 20 mg/dL '7 8 9  ' Creatinine 0.61 - 1.24 mg/dL 0.66 0.69 0.68  Sodium 135 - 145 mmol/L 136 136 138  Potassium 3.5 - 5.1 mmol/L 4.2 4.6 4.1  Chloride 98 - 111 mmol/L 105 108 108  CO2 22 - 32 mmol/L 23 21(L) 24  Calcium 8.9 -  10.3 mg/dL 8.2(L) 8.1(L) 8.1(L)   Discharge Instructions: Discharge Instructions    Diet - low sodium heart healthy   Complete by: As directed    Discharge instructions   Complete by: As directed    Mr. Ysidro Evert,   Thanks for letting us take care of you. We will call you with the results of your bone marrow biopsy results. I will set you up with home health physical therapy.   We have scheduled a follow up appointment for you on January 19th at 10:15 AM at our clinic.   Thanks and Take Care!   Increase activity slowly   Complete by: As directed      Signed: Earlene Plater, MD Internal Medicine, PGY1 Pager: [phone]  12/04/2019,4:14 PM

## 2019-12-05 ENCOUNTER — Telehealth: Payer: Self-pay | Admitting: General Practice

## 2019-12-05 NOTE — Telephone Encounter (Signed)
Attempted TOC call. No answer at either number but messages left on both VMs requesting return call. Also, left appt reminder for 12/16/2019 at 1015. Kinnie Feil, BSN, RN-BC

## 2019-12-09 ENCOUNTER — Encounter (HOSPITAL_COMMUNITY): Payer: Self-pay | Admitting: Internal Medicine

## 2019-12-16 ENCOUNTER — Encounter: Payer: Self-pay | Admitting: Internal Medicine

## 2019-12-16 ENCOUNTER — Other Ambulatory Visit: Payer: Self-pay

## 2019-12-16 ENCOUNTER — Ambulatory Visit (INDEPENDENT_AMBULATORY_CARE_PROVIDER_SITE_OTHER): Payer: Self-pay | Admitting: Internal Medicine

## 2019-12-16 VITALS — BP 116/84 | HR 87 | Temp 98.2°F | Ht 72.0 in | Wt 226.7 lb

## 2019-12-16 DIAGNOSIS — M62838 Other muscle spasm: Secondary | ICD-10-CM | POA: Insufficient documentation

## 2019-12-16 DIAGNOSIS — M62830 Muscle spasm of back: Secondary | ICD-10-CM

## 2019-12-16 DIAGNOSIS — R5381 Other malaise: Secondary | ICD-10-CM

## 2019-12-16 DIAGNOSIS — D619 Aplastic anemia, unspecified: Secondary | ICD-10-CM

## 2019-12-16 MED ORDER — CYCLOBENZAPRINE HCL 5 MG PO TABS: 5.0000 mg | ORAL_TABLET | Freq: Three times a day (TID) | ORAL | Status: DC | PRN

## 2019-12-16 MED ORDER — CYCLOBENZAPRINE HCL 5 MG PO TABS: 5.0000 mg | ORAL_TABLET | Freq: Three times a day (TID) | ORAL | 0 refills | Status: DC | PRN

## 2019-12-16 NOTE — Progress Notes (Signed)
   CC: aplastic anemia  HPI:  [name] is a 39 y.o. gentleman who presents for hospital follow up. Please see problem based assessment and plan for additional details.     Past Medical History:  Diagnosis Date  . Hypertension     Review of Systems:  Review of Systems - General ROS: positive for  - weight loss negative for - chills or fever Respiratory ROS: positive for - shortness of breath negative for - cough or pleuritic pain Cardiovascular ROS: negative for - chest pain Gastrointestinal ROS: positive for - abdominal pain and appetite loss Musculoskeletal ROS: positive for - joint pain and muscle pain Neurological ROS: negative for - confusion, dizziness or headaches Dermatological ROS: positive for pruritus   Physical Exam:  Vitals:   12/16/19 1019  BP: 116/84  Pulse: 87  Temp: 98.2 F (36.8 C)  TempSrc: Oral  SpO2: 100%  Weight: 226 lb 11.2 oz (102.8 kg)  Height: 6' (1.829 m)    GENERAL: chronically ill appearing HEENT: some mild swelling under left eye. No conjunctival injection. No lip swelling. CARDIAC: heart regular rate and rhythm, no peripheral edema appreciated PULMONARY: diminished lung sounds throughout. No adventitious lung sounds. ABDOMEN: bowel sounds active.  SKIN: dry scaly rash predominantly over his lower back with hyperpigmentation   Assessment & Plan:   See Encounters Tab for problem based charting.  Pertinent labs & imaging results that were available during my care of the patient were reviewed by me and considered in my medical decision making  Patient is in agreement with the plan and endorses no further questions at this time.  Patient seen with Dr. Theressa Millard, MD Internal Medicine Resident-PGY1 12/17/19

## 2019-12-16 NOTE — Assessment & Plan Note (Addendum)
39 yo male presenting to the clinic today for hospital follow up. Hospitalized 11/24/19-12/02/19 for 3 months of anorexia, weight loss, fatigue, intermittent joint pain responsive to steroids and NSAIDs, and a rash on his fingers his eyelids and abdomen. Patient was also noted to be pancytopenic on admission. A robust infectious disease workup was ordered, including Lyme antibodies, HIV PCR, hepatitis panel, CMV PCR, EBV PCR, and syphilis antibodies which all were negative. Patient had an elevated sed rate to 57 but normal CRP. Iron studies were consistent with anemia of chronic disease. He underwent a bone marrow bx on 1/5 which does show a hypocellular pattern with a few megakaryocytes with atypia.  He notes that the symptoms were preceded by a rash which he has photos of. The rash appears to have been umbilicated papules that involved his face, chest, arms and back. He then developed facial swelling. He denies any potential inciting events including medication changes, viral illnesses or other environmental changes.  He denies any personal or family history of similar issues including anemia. In the clinic today, he looks chronically ill and is in a wheelchair. PE is remarkable for weakness and a darkly pigmented scaly rash that patient notes is puritic and involves his lower back and arms. He also does have some facial swelling--predominently under his left eye. No discernable heliotropic rash is appreciated.  Assessment: unclear what ties these various findings together. 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 With that being said, I feel that he would be best served being evaluated with hematology to continue the workup for this.   Plan:  hematology referral. Will try to reach out to them and get the next available appt F/u pending heme availability If unable to get an appointment soon with hematology, will pursue  further workup with B12, UA, blood cultures,etc

## 2019-12-16 NOTE — Assessment & Plan Note (Signed)
Patient noting muscle tension involving his back. He was previously very active however has been fairly bed bound over the past few months due to his generalized symptoms.  PE significant for paraspinal muscle tension. Plan: order for flexeril sent to pharmacy. Discussed cautions with using this medication.

## 2019-12-16 NOTE — Patient Instructions (Signed)
It was so nice to meet you today. I am going to call and try to get an appointment with hematology arranged. If they are unable to schedule you until a few weeks from now, I would like to get some labs but I will call you and let you know what I find out.

## 2019-12-16 NOTE — Assessment & Plan Note (Signed)
Patient notes PT home health has been visiting him however he continues to feel fatigued very quickly after any activity. Continue home health PT.

## 2019-12-17 ENCOUNTER — Encounter: Payer: Self-pay | Admitting: *Deleted

## 2019-12-17 ENCOUNTER — Encounter: Payer: Self-pay | Admitting: Internal Medicine

## 2019-12-17 NOTE — Progress Notes (Signed)
Reached out to Andrew Byrd to introduce myself as the office RN Navigator and explain our new patient process. Reviewed the reason for their referral and scheduled their new patient appointment along with labs. Provided address and directions to the office including call back phone number. Reviewed with patient any concerns they may have or any possible barriers to attending their appointment.   Informed patient about my role as a navigator and that I will meet with them prior to their New Patient appointment and more fully discuss what services I can provide. At this time patient has no further questions or needs.   Dr Dion Body requested to see patient this Friday, but patient was unable due to scheduling conflicts. Taking into account Dr Kem Kays schedule and the patient's availability, the soonest appointment was made for Tuesday.

## 2019-12-17 NOTE — Progress Notes (Signed)
Per Dr Kem Kays request  [facility] and requested that [facility] for MDS be added to specimen [id number] DOS 12/02/2019. Spoke to [employer].   Request sent to [facility] for Molecular testing to be completed on specimen [id number] DOS 12/02/2019. Request faxed.

## 2019-12-19 NOTE — Progress Notes (Signed)
Internal Medicine Clinic Attending  Case discussed with Dr. Christian at the time of the visit.  We reviewed the resident's history and exam and pertinent patient test results.  I agree with the assessment, diagnosis, and plan of care documented in the resident's note.    

## 2019-12-22 ENCOUNTER — Other Ambulatory Visit: Payer: Self-pay | Admitting: Hematology

## 2019-12-22 DIAGNOSIS — D61818 Other pancytopenia: Secondary | ICD-10-CM

## 2019-12-22 NOTE — Progress Notes (Signed)
[facility] CONSULT NOTE  Patient Care Team: Patient, No Pcp Per as PCP - General (General Practice)  HEME/ONC OVERVIEW: 1. Pancytopenia -10/2019: new onset pancytopenia (WBC ~2k w/ ANC > 1500, Hgb ~10 and plts ~120k)  Infectious work-up, including HIV, Hep B/C, CMV, EBV, syphilis, Lyme disease, and COVID, negative  TSH, free T4 normal   Nutritional studies: iron 41, TIBC 178, saturation 23%, ferritin 2217  ESR 57; ANA negative   Hepatic steatosis on CT, but no other abnormality on CT CAP  Gastric emptying study normal   Bone marrow bx: hypocellular marrow with mild megakaryocytic atypia; no leukemia, lymphoma or high-grade dysplasia identified; karyotype 46,XY[20]  ASSESSMENT & PLAN:   Pancytopenia -I reviewed the patient's records extensively, including multiple ER visit notes, recent hospitalization records, external PCP clinic notes, lab studies, imaging results, and pathology reports -I also independently reviewed the radiologic images of recent CT chest, abdomen and pelvis, and agree with findings documented -In summary, patient has had multiple ER visits since early 2020 for multiple nonspecific symptoms, including anorexia, weight loss, fatigue, joint pain, and rash.  During the most recent hospitalization in late 10/2019, he had extensive work-up, including infectious (HIV, hep B/C, CMV, EBV, and Covid), nutritional (ferritin 2217, saturation 23%, serum iron 41), and rheumatologic (ANA negative) studies.  CT showed mild hepatic steatosis, but there was no lymphadenopathy or malignancy.  ESR was noted to be very high at 57.  He ultimately underwent bone marrow biopsy, which showed a hypocellular marrow with mild megakaryocytic atypia, but there was no evidence of leukemia, lymphoma, or high-grade dysplasia.  Karyotype was normal.  MDS FISH panel is pending. -I reviewed the lab and pathology results in detail with the patient -I personally reviewed the patient's  peripheral blood smear, which showed a few scattered elliptocytes and echinocytes, decreased number of white blood cells with relatively normal morphology, and mildly decreased platelet count with occasional platelet clumping.  There was no circulating blast.  -In the absence of any overt dysplasia on the bone marrow biopsy, the mild megakaryocytic atypia alone does not constitute myelodysplastic syndrome.  Review of the CBC showed normal blood counts until 10/2019, making a rapidly evolving MDS much less likely.  Furthermore, given the patient's young age, MDS is uncommon in the absence of any family history of hematologic disorder. -I have ordered additional nutritional studies, including B12, folate, zinc, and copper (heavy metal panel added for the next visit) -Furthermore, due to a suspicion for rheumatologic disorder, including myalgia, arthralgia, and rash, I have ordered additional rheumatologic studies, including rheumatoid factor, Sjogren's panel, anti-ds-DNA, ANCA panel, and anti-centromere antibodies -In addition, I have ordered soluble IL-2 and triglyceride level (rule out [place]) and PNH flow panel to rule out paroxysmal nocturnal hemoglobinuria  -Given the elevated ferritin, ESR, and the constellation of myalgia, arthralgia and rash, adult Still's disease is on the differential, and therefore I have referred the patient to rheumatology for further evaluation of any underlying rheumatologic/autoimmune disorder -Unless his MDS FISH panel shows any distinct changes that would suggest MDS, the pancytopenia is most likely secondary to a systemic disorder (such as autoimmune disorder or viral illness) rather than a primary hematologic disease.  If he develops worsening pancytopenia, we can consider referring him to a tertiary academic center to review his bone marrow biopsy results.   Orders Placed This Encounter  Procedures  . CBC with Differential (Cancer Center Only)    Standing Status:   Future     Standing  Expiration Date:   01/26/2021  . CMP ([facility] only)    Standing Status:   Future    Standing Expiration Date:   01/26/2021  . Ferritin    Standing Status:   Future    Standing Expiration Date:   01/26/2021  . Iron and TIBC    Standing Status:   Future    Standing Expiration Date:   01/26/2021  . Lactate dehydrogenase    Standing Status:   Future    Standing Expiration Date:   01/26/2021  . Drug Screen 12+Alcohol+CRT, Ur  . PNH Profile (High Sensitivity)    Standing Status:   Future    Standing Expiration Date:   12/22/2020  . Heavy metals, blood    Standing Status:   Future    Standing Expiration Date:   12/22/2020    Order Specific Question:   [place] of residence?    Answer:   [place] [[id number]]  . Miscellaneous [facility] test (send-out)    Standing Status:   Future    Standing Expiration Date:   12/22/2020    Order Specific Question:   Test name / description:    Answer:   Soluble interleukin-2    The total time spent in the appointment was 67 minutes encounter with patients including review of chart and various tests results, discussions about plan of care and coordination of care plan  All questions were answered. The patient knows to call the clinic with any problems, questions or concerns. No barriers to learning was detected.  Return in 3 weeks for labs and clinic follow-up.  Tish Men, MD 1/26/20213:36 PM  CHIEF COMPLAINTS/PURPOSE OF CONSULTATION:  "I am just short of breath"  HISTORY OF PRESENTING ILLNESS:  Andrew Byrd 39 y.o. male is here because of new onset pancytopenia.  Patient reports that starting in September 2020, he developed decreasing appetite, and within 2 to 3 months, he also developed worsening shortness of breath, arthralgia affecting the arms and left leg, rash over the abdomen and chest, and myalgia primarily in the lower extremities.  He has had multiple ER visits since early 2020 for multiple nonspecific symptoms, including anorexia, weight gain,  fatigue, joint pain, and rash.  During the most recent hospitalization in late 10/2019, he had extensive work-up, including infectious (HIV, hep B/C, CMV, EBV, and Covid), nutritional (ferritin 2217, saturation 23%, serum iron 41), and rheumatologic (ANA negative) studies.  CT showed mild hepatic steatosis, but there was no lymphadenopathy or malignancy.  ESR was noted to be very high at 57.  He ultimately underwent bone marrow biopsy, which showed a hypocellular marrow with mild megakaryocytic atypia, but there was no evidence of leukemia, lymphoma, or high-grade dysplasia.  Karyotype was normal.  MDS FISH panel is pending.  He reports that he continues to be short of breath and has very little appetite.  He used to smoke recreational marijuana, but has not used any illicit drugs for several months.  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  He denies any recent travel, exposure to other people with illness, or medication change.  He denies any other complaints today.  REVIEW OF SYSTEMS:   Constitutional: ( - ) fevers, ( - )  chills , ( - ) night sweats Eyes: ( - ) blurriness of vision, ( - ) double vision, ( - ) watery eyes Ears, nose, mouth, throat, and face: ( - ) mucositis, ( - ) sore throat Respiratory: ( - ) cough, ( + )  dyspnea, ( - ) wheezes Cardiovascular: ( - ) palpitation, ( - ) chest discomfort, ( - ) lower extremity swelling Gastrointestinal:  ( + ) nausea, ( - ) heartburn, ( - ) change in bowel habits Skin: ( + ) abnormal skin rashes Lymphatics: ( - ) new lymphadenopathy, ( - ) easy bruising Neurological: ( - ) numbness, ( - ) tingling, ( + ) generalized weaknesses Behavioral/Psych: ( - ) mood change, ( - ) new changes  All other systems were reviewed with the patient and are negative.  I have reviewed his chart and materials related to his cancer extensively and collaborated history with the patient. Summary of oncologic history is as follows: Oncology History   No  history exists.    MEDICAL HISTORY:  Past Medical History:  Diagnosis Date  . Hypertension     SURGICAL HISTORY: No past surgical history on file.  SOCIAL HISTORY: Social History   Socioeconomic History  . Marital status: Single    Spouse name: Not on file  . Number of children: Not on file  . Years of education: Not on file  . Highest education level: Not on file  Occupational History  . Not on file  Tobacco Use  . Smoking status: Former Smoker    Types: Cigarettes    Quit date: 08/16/2019    Years since quitting: 0.3  . Smokeless tobacco: Never Used  Substance and Sexual Activity  . Alcohol use: No  . Drug use: Yes    Types: Marijuana  . Sexual activity: Yes    Partners: Female  Other Topics Concern  . Not on file  Social History Narrative  . Not on file   Social Determinants of Health   Financial Resource Strain:   . Difficulty of Paying Living Expenses: Not on file  Food Insecurity:   . Worried About Charity fundraiser in the Last Year: Not on file  . Ran Out of Food in the Last Year: Not on file  Transportation Needs:   . Lack of Transportation (Medical): Not on file  . Lack of Transportation (Non-Medical): Not on file  Physical Activity:   . Days of Exercise per Week: Not on file  . Minutes of Exercise per Session: Not on file  Stress:   . Feeling of Stress : Not on file  Social Connections:   . Frequency of Communication with Friends and Family: Not on file  . Frequency of Social Gatherings with Friends and Family: Not on file  . Attends Religious Services: Not on file  . Active Member of Clubs or Organizations: Not on file  . Attends Archivist Meetings: Not on file  . Marital Status: Not on file  Intimate Partner Violence:   . Fear of Current or Ex-Partner: Not on file  . Emotionally Abused: Not on file  . Physically Abused: Not on file  . Sexually Abused: Not on file    FAMILY HISTORY: Family History  Problem Relation Age of  Onset  . Hypertension Other     ALLERGIES:  is allergic to other and penicillins.  MEDICATIONS:  Current Outpatient Medications  Medication Sig Dispense Refill  . cyclobenzaprine (FLEXERIL) 5 MG tablet Take 1 tablet (5 mg total) by mouth 3 (three) times daily as needed for muscle spasms. 30 tablet 0  . fluticasone (FLONASE) 50 MCG/ACT nasal spray Place 2 sprays into both nostrils daily. (Patient not taking: Reported on 11/25/2019) 16 g 0  . naproxen (NAPROSYN) 500 MG  tablet Take 1 tablet (500 mg total) by mouth 2 (two) times daily. (Patient not taking: Reported on 11/25/2019) 30 tablet 0  . triamcinolone cream (KENALOG) 0.1 % Apply 1 application topically 2 (two) times daily. (Patient not taking: Reported on 11/25/2019) 30 g 0   No current facility-administered medications for this visit.    PHYSICAL EXAMINATION: ECOG PERFORMANCE STATUS: 2 - Symptomatic, <50% confined to bed  Vitals:   12/23/19 1456  BP: (!) 121/105  Pulse: 65  Resp: 20  Temp: 97.7 F (36.5 C)  SpO2: 100%   Filed Weights   12/23/19 1456  Weight: 225 lb (102.1 kg)    GENERAL: alert, mildly ill appearing sitting in a wheelchair  SKIN: generalized dried and peeling skin, c/w eczema.   EYES: conjunctiva are pink and non-injected, sclera clear OROPHARYNX: no exudate, no erythema; lips, buccal mucosa, and tongue normal  NECK: supple, non-tender LYMPH:  no palpable lymphadenopathy in the cervical LUNGS: clear to auscultation, speaking in short sentences due to dyspnea  HEART: regular rate & rhythm, no murmurs, no lower extremity edema ABDOMEN: soft, non-tender, non-distended, normal bowel sounds Musculoskeletal: no cyanosis of digits and no clubbing  PSYCH: alert & oriented x 3  LABORATORY DATA:  I have reviewed the data as listed Lab Results  Component Value Date   WBC 1.9 (L) 12/23/2019   HGB 11.1 (L) 12/23/2019   HCT 35.6 (L) 12/23/2019   MCV 94.4 12/23/2019   PLT 172 12/23/2019   Lab Results   Component Value Date   NA 138 12/23/2019   K 4.5 12/23/2019   CL 105 12/23/2019   CO2 27 12/23/2019    RADIOGRAPHIC STUDIES: I have personally reviewed the radiological images as listed and agreed with the findings in the report. DG Chest 2 View  Result Date: 11/24/2019 CLINICAL DATA:  Shortness of breath. EXAM: CHEST - 2 VIEW COMPARISON:  CT chest/abdomen/pelvis 08/04/2019, chest radiograph 08/04/2019 FINDINGS: Heart size within normal limits. Chronic elevation of the left hemidiaphragm. No airspace consolidation. No evidence of pleural effusion or pneumothorax. No acute bony abnormality. Nonspecific air distention of the colon within the partially imaged upper abdomen. IMPRESSION: No evidence of acute cardiopulmonary abnormality. Nonspecific air distention of the colon within the partially imaged upper abdomen. Electronically Signed   By: Kellie Simmering DO   On: 11/24/2019 17:20   CT Angio Chest PE W and/or Wo Contrast  Result Date: 11/25/2019 CLINICAL DATA:  Shortness of breath for 1 month. Recent weight loss. Decreased appetite. EXAM: CT ANGIOGRAPHY CHEST WITH CONTRAST TECHNIQUE: Multidetector CT imaging of the chest was performed using the standard protocol during bolus administration of intravenous contrast. Multiplanar CT image reconstructions and MIPs were obtained to evaluate the vascular anatomy. CONTRAST:  163m OMNIPAQUE IOHEXOL 350 MG/ML SOLN COMPARISON:  Chest CTA 08/04/2019. Performed in conjunction with CT of the abdomen/pelvis, reported separately. FINDINGS: Cardiovascular: There are no filling defects within the pulmonary arteries to suggest pulmonary embolus. The thoracic aorta is normal in caliber. Heart is normal in size. No pericardial effusion. Mediastinum/Nodes: No enlarged mediastinal or hilar lymph nodes. Subcentimeter hypodense left thyroid nodules only partially included in the field of view. No esophageal wall thickening. Lungs/Pleura: Previous right upper lobe  ground-glass opacity has resolved. No new airspace disease. Subsegmental linear atelectasis in the lingula. Previous left pleural effusion has resolved. No pulmonary edema. Trachea and mainstem bronchi are patent. Upper Abdomen: Assessed on dedicated abdominopelvic CT, performed concurrently and reported separately. Musculoskeletal: There are no acute or suspicious  osseous abnormalities. Nodular density in the subcutaneous tissues of the left posterior chest wall, series 5, image 52, is not significantly changed from prior. Review of the MIP images confirms the above findings. IMPRESSION: 1. No pulmonary embolus or acute intrathoracic abnormality. 2. The right upper lobe 8 mm ground-glass nodular opacity and left pleural effusion on chest CT 3 months ago have resolved. 3. Subcutaneous density in the left posterior chest wall is unchanged from prior, likely focal area of scarring or sebaceous cyst, but nonspecific in CT appearance. Electronically Signed   By: Keith Rake M.D.   On: 11/25/2019 05:31   NM GASTRIC EMPTYING  Result Date: 11/27/2019 CLINICAL DATA:  Early satiety EXAM: NUCLEAR MEDICINE GASTRIC EMPTYING SCAN TECHNIQUE: After oral ingestion of radiolabeled meal, sequential abdominal images were obtained for 4 hours. Percentage of activity emptying the stomach was calculated at 1 hour, 2 hour, 3 hour, and 4 hours. RADIOPHARMACEUTICALS:  1.9 mCi Tc-72msulfur colloid in standardized meal including a COMPARISON:  None. FINDINGS: Expected location of the stomach in the left upper quadrant. Ingested meal empties the stomach gradually over the course of the study. 65.2% emptied at 1 hr ( normal >= 10%) 73.9% emptied at 2 hr ( normal >= 40%) 90.7% emptied at 3 hr ( normal >= 70%) 96.3% emptied at 4 hr ( normal >= 90%) IMPRESSION: Normal gastric emptying study. Electronically Signed   By: WLowella GripIII M.D.   On: 11/27/2019 16:08   CT ABDOMEN PELVIS W CONTRAST  Result Date: 11/25/2019 CLINICAL  DATA:  Abdominal pain, unspecified. Shortness of breath for 1 month. Recent weight loss with no appetite. EXAM: CT ABDOMEN AND PELVIS WITH CONTRAST TECHNIQUE: Multidetector CT imaging of the abdomen and pelvis was performed using the standard protocol following bolus administration of intravenous contrast. CONTRAST:  1062mOMNIPAQUE IOHEXOL 350 MG/ML SOLN COMPARISON:  Performed in conjunction with chest CTA, reported separately. Chest, abdomen, and pelvis CTA 08/04/2019 FINDINGS: Lower chest: Assessed on concurrent chest CT, reported separately. Hepatobiliary: Diffusely decreased hepatic density consistent with steatosis. Intraluminal gallstones without pericholecystic inflammation. No biliary dilatation. Pancreas: No ductal dilatation or inflammation. Spleen: Normal in size without focal abnormality. Adrenals/Urinary Tract: Normal adrenal glands. No hydronephrosis or perinephric edema. Homogeneous renal enhancement with symmetric excretion on delayed phase imaging. Urinary bladder is physiologically distended without wall thickening. Stomach/Bowel: Bowel evaluation is limited in the absence of enteric contrast. Stomach is nondistended. No gastric wall thickening. No small bowel inflammation or obstruction. Normal appendix. Small volume of stool in the ascending colon. Slight gaseous distension of transverse colon. Descending and sigmoid colon are nondistended. No evidence of colonic wall thickening or colonic mass on CT. Vascular/Lymphatic: Normal caliber abdominal aorta. Portal vein is patent. No adenopathy. Reproductive: Prostate is unremarkable. Other: Small fat containing periumbilical hernia. No ascites or free air. No evidence of intra-abdominal mass. Musculoskeletal: There are no acute or suspicious osseous abnormalities. IMPRESSION: 1. No acute abnormality or explanation for abdominal pain or weight loss. 2. Hepatic steatosis. Cholelithiasis without gallbladder inflammation. 3. Small fat containing  periumbilical hernia. Electronically Signed   By: MeKeith Rake.D.   On: 11/25/2019 05:39   CT BONE MARROW BIOPSY & ASPIRATION  Result Date: 12/02/2019 INDICATION: [age]ear old male with a history of pancytopenia EXAM: CT BONE MARROW BIOPSY AND ASPIRATION MEDICATIONS: None. ANESTHESIA/SEDATION: Moderate (conscious) sedation was employed during this procedure. A total of Versed 3.0 mg and Fentanyl 100 mcg was administered intravenously. Moderate Sedation Time: 11 minutes. The patient's level of consciousness and  vital signs were monitored continuously by radiology nursing throughout the procedure under my direct supervision. FLUOROSCOPY TIME:  CT COMPLICATIONS: None PROCEDURE: The procedure risks, benefits, and alternatives were explained to the patient. Questions regarding the procedure were encouraged and answered. The patient understands and consents to the procedure. Scout CT of the pelvis was performed for surgical planning purposes. The posterior pelvis was prepped with Chlorhexidine in a sterile fashion, and a sterile drape was applied covering the operative field. A sterile gown and sterile gloves were used for the procedure. Local anesthesia was provided with 1% Lidocaine. Left posterior iliac bone was targeted for biopsy. The skin and subcutaneous tissues were infiltrated with 1% lidocaine without epinephrine. A small stab incision was made with an 11 blade scalpel, and an 11 gauge Murphy needle was advanced with CT guidance to the posterior cortex. Manual forced was used to advance the needle through the posterior cortex and the stylet was removed. A bone marrow aspirate was retrieved and passed to a cytotechnologist in the room. The Murphy needle was then advanced without the stylet for a core biopsy. The core biopsy was retrieved and also passed to a cytotechnologist. Manual pressure was used for hemostasis and a sterile dressing was placed. No complications were encountered no significant blood  loss was encountered. Patient tolerated the procedure well and remained hemodynamically stable throughout. IMPRESSION: Status post CT-guided bone marrow biopsy, with tissue specimen sent to pathology for complete histopathologic analysis Signed, Dulcy Fanny. Earleen Newport, DO [facility] Electronically Signed   By: Corrie Mckusick D.O.   On: 12/02/2019 09:20    PATHOLOGY: I have reviewed the pathology reports as documented in the oncologist history.

## 2019-12-23 ENCOUNTER — Other Ambulatory Visit: Payer: Self-pay

## 2019-12-23 ENCOUNTER — Encounter: Payer: Self-pay | Admitting: Hematology

## 2019-12-23 ENCOUNTER — Inpatient Hospital Stay: Payer: Self-pay | Attending: Hematology

## 2019-12-23 ENCOUNTER — Inpatient Hospital Stay (HOSPITAL_BASED_OUTPATIENT_CLINIC_OR_DEPARTMENT_OTHER): Payer: Self-pay | Admitting: Hematology

## 2019-12-23 ENCOUNTER — Encounter: Payer: Self-pay | Admitting: *Deleted

## 2019-12-23 VITALS — BP 121/105 | HR 65 | Temp 97.7°F | Resp 20 | Ht 72.0 in | Wt 225.0 lb

## 2019-12-23 DIAGNOSIS — D61818 Other pancytopenia: Secondary | ICD-10-CM | POA: Insufficient documentation

## 2019-12-23 DIAGNOSIS — M2550 Pain in unspecified joint: Secondary | ICD-10-CM

## 2019-12-23 DIAGNOSIS — R7989 Other specified abnormal findings of blood chemistry: Secondary | ICD-10-CM | POA: Insufficient documentation

## 2019-12-23 DIAGNOSIS — M061 Adult-onset Still's disease: Secondary | ICD-10-CM

## 2019-12-23 DIAGNOSIS — M791 Myalgia, unspecified site: Secondary | ICD-10-CM

## 2019-12-23 DIAGNOSIS — K760 Fatty (change of) liver, not elsewhere classified: Secondary | ICD-10-CM | POA: Insufficient documentation

## 2019-12-23 DIAGNOSIS — R630 Anorexia: Secondary | ICD-10-CM | POA: Insufficient documentation

## 2019-12-23 DIAGNOSIS — Z87891 Personal history of nicotine dependence: Secondary | ICD-10-CM | POA: Insufficient documentation

## 2019-12-23 DIAGNOSIS — R0602 Shortness of breath: Secondary | ICD-10-CM

## 2019-12-23 DIAGNOSIS — R700 Elevated erythrocyte sedimentation rate: Secondary | ICD-10-CM

## 2019-12-23 DIAGNOSIS — R21 Rash and other nonspecific skin eruption: Secondary | ICD-10-CM | POA: Insufficient documentation

## 2019-12-23 LAB — CBC WITH DIFFERENTIAL (CANCER CENTER ONLY)
Abs Immature Granulocytes: 0 10*3/uL (ref 0.00–0.07)
Basophils Absolute: 0 10*3/uL (ref 0.0–0.1)
Basophils Relative: 1 %
Eosinophils Absolute: 0.1 10*3/uL (ref 0.0–0.5)
Eosinophils Relative: 4 %
HCT: 35.6 % — ABNORMAL LOW (ref 39.0–52.0)
Hemoglobin: 11.1 g/dL — ABNORMAL LOW (ref 13.0–17.0)
Immature Granulocytes: 0 %
Lymphocytes Relative: 34 %
Lymphs Abs: 0.6 10*3/uL — ABNORMAL LOW (ref 0.7–4.0)
MCH: 29.4 pg (ref 26.0–34.0)
MCHC: 31.2 g/dL (ref 30.0–36.0)
MCV: 94.4 fL (ref 80.0–100.0)
Monocytes Absolute: 0.2 10*3/uL (ref 0.1–1.0)
Monocytes Relative: 9 %
Neutro Abs: 1 10*3/uL — ABNORMAL LOW (ref 1.7–7.7)
Neutrophils Relative %: 52 %
Platelet Count: 172 10*3/uL (ref 150–400)
RBC: 3.77 MIL/uL — ABNORMAL LOW (ref 4.22–5.81)
RDW: 17.8 % — ABNORMAL HIGH (ref 11.5–15.5)
WBC Count: 1.9 10*3/uL — ABNORMAL LOW (ref 4.0–10.5)
nRBC: 0 % (ref 0.0–0.2)

## 2019-12-23 LAB — FOLATE: Folate: 3.6 ng/mL — ABNORMAL LOW (ref >5.9)

## 2019-12-23 LAB — CMP (CANCER CENTER ONLY)
ALT: 35 U/L (ref 0–44)
AST: 45 U/L — ABNORMAL HIGH (ref 15–41)
Albumin: 2.7 g/dL — ABNORMAL LOW (ref 3.5–5.0)
Alkaline Phosphatase: 90 U/L (ref 38–126)
Anion gap: 6 (ref 5–15)
BUN: 8 mg/dL (ref 6–20)
CO2: 27 mmol/L (ref 22–32)
Calcium: 8.2 mg/dL — ABNORMAL LOW (ref 8.9–10.3)
Chloride: 105 mmol/L (ref 98–111)
Creatinine: 0.55 mg/dL — ABNORMAL LOW (ref 0.61–1.24)
GFR, Est AFR Am: >60 mL/min (ref >60)
GFR, Estimated: >60 mL/min (ref >60)
Glucose, Bld: 107 mg/dL — ABNORMAL HIGH (ref 70–99)
Potassium: 4.5 mmol/L (ref 3.5–5.1)
Sodium: 138 mmol/L (ref 135–145)
Total Bilirubin: 1.1 mg/dL (ref 0.3–1.2)
Total Protein: 5.6 g/dL — ABNORMAL LOW (ref 6.5–8.1)

## 2019-12-23 LAB — VITAMIN B12: Vitamin B-12: 1249 pg/mL — ABNORMAL HIGH (ref 180–914)

## 2019-12-23 LAB — SAVE SMEAR(SSMR), FOR PROVIDER SLIDE REVIEW

## 2019-12-23 NOTE — Progress Notes (Signed)
Initial RN Navigator Patient Visit  Name: Andrew Byrd Date of Referral : 12/16/2019 Diagnosis: Unknown, Anemia  Met with patient prior to their visit with MD. Hanley Seamen patient "Your Patient Navigator" handout which explains my role, areas in which I am able to help, and all the contact information for myself and the office. Also gave patient MD and Navigator business card. Reviewed with patient the general overview of expected course after initial diagnosis and time frame for all steps to be completed.  New patient packet given to patient which includes: orientation to office and staff; campus directory; education on My Chart and Advance Directives.  Patient completed visit with Dr. Maylon Peppers  Revisited with patient after MD visit. Patient will need  Referral to Rheumatology  Other order dependant on results of extensive blood work.  Patient understands all follow up procedures and expectations. They have my number to reach out for any further clarification or additional needs.

## 2019-12-24 ENCOUNTER — Telehealth: Payer: Self-pay | Admitting: Hematology

## 2019-12-24 ENCOUNTER — Encounter (HOSPITAL_COMMUNITY): Payer: Self-pay | Admitting: Hematology

## 2019-12-24 LAB — SURGICAL PATHOLOGY

## 2019-12-24 LAB — ANCA TITERS
Atypical P-ANCA titer: <1:20 {titer}
C-ANCA: <1:20 {titer}
P-ANCA: <1:20 {titer}

## 2019-12-24 LAB — CENTROMERE ANTIBODIES: Centromere Ab Screen: <0.2 AI (ref 0.0–0.9)

## 2019-12-24 LAB — RHEUMATOID FACTOR: Rheumatoid fact SerPl-aCnc: <10 IU/mL (ref 0.0–13.9)

## 2019-12-24 LAB — ANTI-DNA ANTIBODY, DOUBLE-STRANDED: ds DNA Ab: <1 IU/mL (ref 0–9)

## 2019-12-24 LAB — LACTATE DEHYDROGENASE: LDH: 322 U/L — ABNORMAL HIGH (ref 98–192)

## 2019-12-24 NOTE — Telephone Encounter (Signed)
Appointments scheduled calendar was printed per 1/26 los

## 2019-12-25 LAB — COPPER, SERUM: Copper: 80 ug/dL (ref 69–132)

## 2019-12-25 LAB — ZINC: Zinc: 116 ug/dL — ABNORMAL HIGH (ref 44–115)

## 2019-12-30 ENCOUNTER — Encounter (HOSPITAL_COMMUNITY): Payer: Self-pay

## 2020-01-02 NOTE — Telephone Encounter (Signed)
Called, no answer, lm for rtc 

## 2020-01-03 ENCOUNTER — Encounter (HOSPITAL_COMMUNITY): Payer: Self-pay | Admitting: Internal Medicine

## 2020-01-13 ENCOUNTER — Inpatient Hospital Stay (HOSPITAL_BASED_OUTPATIENT_CLINIC_OR_DEPARTMENT_OTHER): Payer: Self-pay | Admitting: Hematology

## 2020-01-13 ENCOUNTER — Inpatient Hospital Stay: Payer: Self-pay

## 2020-01-13 ENCOUNTER — Other Ambulatory Visit: Payer: Self-pay

## 2020-01-13 ENCOUNTER — Encounter: Payer: Self-pay | Admitting: Hematology

## 2020-01-13 ENCOUNTER — Telehealth: Payer: Self-pay | Admitting: Hematology

## 2020-01-13 ENCOUNTER — Inpatient Hospital Stay: Payer: Self-pay | Attending: Hematology

## 2020-01-13 VITALS — BP 126/79 | HR 99 | Temp 97.7°F | Resp 18

## 2020-01-13 VITALS — BP 122/52 | HR 64 | Temp 97.7°F | Resp 18 | Ht 72.0 in | Wt 224.0 lb

## 2020-01-13 DIAGNOSIS — R110 Nausea: Secondary | ICD-10-CM | POA: Insufficient documentation

## 2020-01-13 DIAGNOSIS — M061 Adult-onset Still's disease: Secondary | ICD-10-CM

## 2020-01-13 DIAGNOSIS — D61818 Other pancytopenia: Secondary | ICD-10-CM | POA: Insufficient documentation

## 2020-01-13 LAB — CBC WITH DIFFERENTIAL (CANCER CENTER ONLY)
Abs Immature Granulocytes: 0 10*3/uL (ref 0.00–0.07)
Basophils Absolute: 0 10*3/uL (ref 0.0–0.1)
Basophils Relative: 1 %
Eosinophils Absolute: 0 10*3/uL (ref 0.0–0.5)
Eosinophils Relative: 1 %
HCT: 37 % — ABNORMAL LOW (ref 39.0–52.0)
Hemoglobin: 11.8 g/dL — ABNORMAL LOW (ref 13.0–17.0)
Immature Granulocytes: 0 %
Lymphocytes Relative: 32 %
Lymphs Abs: 0.6 10*3/uL — ABNORMAL LOW (ref 0.7–4.0)
MCH: 29.9 pg (ref 26.0–34.0)
MCHC: 31.9 g/dL (ref 30.0–36.0)
MCV: 93.7 fL (ref 80.0–100.0)
Monocytes Absolute: 0.1 10*3/uL (ref 0.1–1.0)
Monocytes Relative: 6 %
Neutro Abs: 1.1 10*3/uL — ABNORMAL LOW (ref 1.7–7.7)
Neutrophils Relative %: 60 %
Platelet Count: 152 10*3/uL (ref 150–400)
RBC: 3.95 MIL/uL — ABNORMAL LOW (ref 4.22–5.81)
RDW: 17 % — ABNORMAL HIGH (ref 11.5–15.5)
WBC Count: 1.9 10*3/uL — ABNORMAL LOW (ref 4.0–10.5)
nRBC: 0 % (ref 0.0–0.2)

## 2020-01-13 LAB — CMP (CANCER CENTER ONLY)
ALT: 40 U/L (ref 0–44)
AST: 52 U/L — ABNORMAL HIGH (ref 15–41)
Albumin: 2.5 g/dL — ABNORMAL LOW (ref 3.5–5.0)
Alkaline Phosphatase: 79 U/L (ref 38–126)
Anion gap: 7 (ref 5–15)
BUN: 10 mg/dL (ref 6–20)
CO2: 27 mmol/L (ref 22–32)
Calcium: 8.1 mg/dL — ABNORMAL LOW (ref 8.9–10.3)
Chloride: 100 mmol/L (ref 98–111)
Creatinine: 0.66 mg/dL (ref 0.61–1.24)
GFR, Est AFR Am: >60 mL/min (ref >60)
GFR, Estimated: >60 mL/min (ref >60)
Glucose, Bld: 133 mg/dL — ABNORMAL HIGH (ref 70–99)
Potassium: 4.1 mmol/L (ref 3.5–5.1)
Sodium: 134 mmol/L — ABNORMAL LOW (ref 135–145)
Total Bilirubin: 1.2 mg/dL (ref 0.3–1.2)
Total Protein: 5.6 g/dL — ABNORMAL LOW (ref 6.5–8.1)

## 2020-01-13 LAB — LACTATE DEHYDROGENASE: LDH: 416 U/L — ABNORMAL HIGH (ref 98–192)

## 2020-01-13 MED ORDER — SODIUM CHLORIDE 0.9 % IV SOLN
Freq: Once | INTRAVENOUS | Status: AC
  Filled 2020-01-13: qty 250

## 2020-01-13 MED ORDER — SODIUM CHLORIDE 0.9 % IV SOLN
INTRAVENOUS | Status: DC
  Filled 2020-01-13 (×2): qty 250

## 2020-01-13 NOTE — Telephone Encounter (Signed)
Return to be determined, pending results today per 2/16 los

## 2020-01-13 NOTE — Progress Notes (Addendum)
[facility] OFFICE PROGRESS NOTE  Patient Care Team: Patient, No Pcp Per as PCP - General (General Practice)  HEME/ONC OVERVIEW: 1. Pancytopenia -10/2019: new onset pancytopenia (WBC ~2k w/ ANC > 1500, Hgb ~10 and plts ~120k)  Infectious work-up, including HIV, Hep B/C, CMV, EBV, syphilis, Lyme disease, and COVID, negative  TSH, free T4 normal   Nutritional studies: iron 41, TIBC 178, saturation 23%, ferritin 2217; B12, zinc and copper normal; folate mildly low   ESR 57  Hepatic steatosis on CT, but no other abnormality on CT CAP  Gastric emptying study normal   Rheumatologic studies, including ANA, ANCA, and anti-centromere ab, negative   Heavy metal screen negative; soluble IL-2 1919  Bone marrow bx: hypocellular marrow with mild megakaryocytic atypia; no leukemia, lymphoma or high-grade dysplasia identified; karyotype 46,XY[20]  ASSESSMENT & PLAN:   Pancytopenia -Etiology unclear; ddx infection, autoimmune disease, and less likely, bone marrow disorders, such as MDS  -I reviewed the lab results in detail with the patient -Extensive studies have not yet reviewed any cause, except mildly low folate level and elevated soluble IL-2  -On exam, he appears more comfortable and improving since the last visit, suggesting a transient trigger, such as a viral illness -I recommended the patient to start folic acid 15m daily  -Due to the elevated soluble IL-2 and ferritin, which may suggest [facility] [facility].  Unfortunately, we have attempted on several occasions to assist with his appt, but he has declined the referral.  I had a lengthy conversion with the patient's sister (healthcare POA), who will have a further discussion with the patient about [facility].   No orders of the defined types were placed in this encounter.  The total time spent in the encounter was 35 minutes,  including face-to-face time with the patient, review of various tests results, order additional studies/medications, documentation, and coordination of care plan.   All questions were answered. The patient knows to call the clinic with any problems, questions or concerns. No barriers to learning was detected.  Return to be determined, pending the workup above.   YTish Men MD 2/16/202112:03 PM  CHIEF COMPLAINT: "I am just tired of getting blood drawn"  INTERVAL HISTORY: [facility] clinic for follow-up of pancytopenia of unknown etiology.  Patient reports that he still has mild exertional shortness of breath, but it is overall improving.  He also has nausea, usually triggered after eating meat, but he is able to eat other types of food without significant nausea.  He denies any constitutional symptoms.  He denies any other complaint today.  REVIEW OF SYSTEMS:   Constitutional: ( - ) fevers, ( - )  chills , ( - ) night sweats Eyes: ( - ) blurriness of vision, ( - ) double vision, ( - ) watery eyes Ears, nose, mouth, throat, and face: ( - ) mucositis, ( - ) sore throat Respiratory: ( - ) cough, ( - ) dyspnea, ( - ) wheezes Cardiovascular: ( - ) palpitation, ( - ) chest discomfort, ( - ) lower extremity swelling Gastrointestinal:  ( - ) nausea, ( - ) heartburn, ( - ) change in bowel habits Skin: ( - ) abnormal skin rashes Lymphatics: ( - ) new lymphadenopathy, ( - ) easy bruising Neurological: ( - ) numbness, ( - ) tingling, ( - ) new weaknesses Behavioral/Psych: ( - ) mood change, ( - )  new changes  All other systems were reviewed with the patient and are negative.  SUMMARY OF ONCOLOGIC HISTORY: Oncology History   No history exists.    I have reviewed the past medical history, past surgical history, social history and family history with the patient and they are unchanged from previous note.  ALLERGIES:  is allergic to other and penicillins.  MEDICATIONS:  Current Outpatient  Medications  Medication Sig Dispense Refill  . cyclobenzaprine (FLEXERIL) 5 MG tablet Take 1 tablet (5 mg total) by mouth 3 (three) times daily as needed for muscle spasms. 30 tablet 0  . triamcinolone cream (KENALOG) 0.1 % Apply 1 application topically 2 (two) times daily. 30 g 0  . fluticasone (FLONASE) 50 MCG/ACT nasal spray Place 2 sprays into both nostrils daily. (Patient not taking: Reported on 11/25/2019) 16 g 0  . naproxen (NAPROSYN) 500 MG tablet Take 1 tablet (500 mg total) by mouth 2 (two) times daily. (Patient not taking: Reported on 11/25/2019) 30 tablet 0   No current facility-administered medications for this visit.    PHYSICAL EXAMINATION: ECOG PERFORMANCE STATUS: 1 - Symptomatic but completely ambulatory  Today's Vitals   01/13/20 1144  BP: (!) 122/52  Pulse: 64  Resp: 18  Temp: 97.7 F (36.5 C)  TempSrc: Temporal  SpO2: 100%  Weight: 224 lb (101.6 kg)  Height: 6' (1.829 m)  PainSc: 0-No pain   Body mass index is 30.38 kg/m.  Filed Weights   01/13/20 1144  Weight: 224 lb (101.6 kg)    GENERAL: alert, no distress, slightly fidgety  SKIN: skin color, texture, turgor are normal, no rashes or significant lesions EYES: conjunctiva are pink and non-injected, sclera clear OROPHARYNX: no exudate, no erythema; lips, buccal mucosa, and tongue normal  NECK: supple, non-tender LYMPH:  no palpable lymphadenopathy in the cervical LUNGS: clear to auscultation with normal breathing effort HEART: regular rhythm, tachycardic, and no murmurs and no lower extremity edema ABDOMEN: soft, non-tender, non-distended, normal bowel sounds Musculoskeletal: no cyanosis of digits and no clubbing  PSYCH: alert & oriented x 3, fluent speech  LABORATORY DATA:  I have reviewed the data as listed    Component Value Date/Time   NA 138 12/23/2019 1418   K 4.5 12/23/2019 1418   CL 105 12/23/2019 1418   CO2 27 12/23/2019 1418   GLUCOSE 107 (H) 12/23/2019 1418   BUN 8 12/23/2019 1418    CREATININE 0.55 (L) 12/23/2019 1418   CALCIUM 8.2 (L) 12/23/2019 1418   PROT 5.6 (L) 12/23/2019 1418   ALBUMIN 2.7 (L) 12/23/2019 1418   AST 45 (H) 12/23/2019 1418   ALT 35 12/23/2019 1418   ALKPHOS 90 12/23/2019 1418   BILITOT 1.1 12/23/2019 1418   GFRNONAA >60 12/23/2019 1418   GFRAA >60 12/23/2019 1418    No results found for: SPEP, UPEP  Lab Results  Component Value Date   WBC 1.9 (L) 12/23/2019   NEUTROABS 1.0 (L) 12/23/2019   HGB 11.1 (L) 12/23/2019   HCT 35.6 (L) 12/23/2019   MCV 94.4 12/23/2019   PLT 172 12/23/2019      Chemistry      Component Value Date/Time   NA 138 12/23/2019 1418   K 4.5 12/23/2019 1418   CL 105 12/23/2019 1418   CO2 27 12/23/2019 1418   BUN 8 12/23/2019 1418   CREATININE 0.55 (L) 12/23/2019 1418      Component Value Date/Time   CALCIUM 8.2 (L) 12/23/2019 1418   ALKPHOS 90 12/23/2019 1418  AST 45 (H) 12/23/2019 1418   ALT 35 12/23/2019 1418   BILITOT 1.1 12/23/2019 1418       RADIOGRAPHIC STUDIES: I have personally reviewed the radiological images as listed below and agreed with the findings in the report. No results found.

## 2020-01-13 NOTE — Patient Instructions (Addendum)

## 2020-01-13 NOTE — Addendum Note (Signed)
Addended by: Arthur Holms on: 01/13/2020 02:53 PM   Modules accepted: Orders

## 2020-01-14 LAB — IRON AND TIBC
Iron: 108 ug/dL (ref 42–163)
Saturation Ratios: 96 % — ABNORMAL HIGH (ref 20–55)
TIBC: 113 ug/dL — ABNORMAL LOW (ref 202–409)
UIBC: 5 ug/dL — ABNORMAL LOW (ref 117–376)

## 2020-01-14 LAB — FERRITIN: Ferritin: 6328 ng/mL — ABNORMAL HIGH (ref 24–336)

## 2020-01-22 LAB — MISC LABCORP TEST (SEND OUT): Labcorp test code: 142455

## 2020-01-28 LAB — HEAVY METALS, BLOOD
Arsenic: 8 ug/L (ref 2–23)
Lead: <1 ug/dL (ref 0–4)
Mercury: <1 ug/L (ref 0.0–14.9)

## 2020-02-13 ENCOUNTER — Other Ambulatory Visit: Payer: Self-pay

## 2020-02-13 ENCOUNTER — Ambulatory Visit (INDEPENDENT_AMBULATORY_CARE_PROVIDER_SITE_OTHER): Payer: Self-pay | Admitting: Internal Medicine

## 2020-02-13 ENCOUNTER — Encounter: Payer: Self-pay | Admitting: Internal Medicine

## 2020-02-13 DIAGNOSIS — R5381 Other malaise: Secondary | ICD-10-CM

## 2020-02-13 DIAGNOSIS — D619 Aplastic anemia, unspecified: Secondary | ICD-10-CM

## 2020-02-13 DIAGNOSIS — D61818 Other pancytopenia: Secondary | ICD-10-CM

## 2020-02-13 DIAGNOSIS — R0602 Shortness of breath: Secondary | ICD-10-CM

## 2020-02-13 NOTE — Progress Notes (Signed)
  This is a telephone encounter between Andrew Byrd and Andrew Byrd on 02/13/2020. The visit was conducted with the patient located at home and Andrew Byrd at [facility] The patient's identity was confirmed using their DOB and current address. The patient has consented to being evaluated through a telephone encounter and understands the associated risks (an examination cannot be done and the patient may need to come in for an appointment) / benefits (allows the patient to remain at home, decreasing exposure to coronavirus). I personally spent 6 minutes on medical discussion.  CC: Physical deconditioning, Aplastic anemia  HPI:  [name] is a 39 y.o. M with PMHx listed below presenting for Physical deconditioning, Aplastic anemia. Please see the A&P for the status of the patient's chronic medical problems.  Past Medical History:  Diagnosis Date  . Hypertension    Review of Systems:  Performed and all others negative.  Physical Exam:  There were no vitals filed for this visit. Not performed for tele-phone visit  Assessment & Plan:   See Encounters Tab for problem based charting.  Patient discussed with Dr. Sandre Kitty

## 2020-02-13 NOTE — Progress Notes (Signed)
Internal Medicine Clinic Attending  Case discussed with Dr. Melvin at the time of the visit.  We reviewed the resident's history and exam and pertinent patient test results.  I agree with the assessment, diagnosis, and plan of care documented in the resident's note.  Myosha Cuadras, M.D., Ph.D.  

## 2020-02-13 NOTE — Assessment & Plan Note (Signed)
Patient is requesting a note concerning his deconditioning and shortness of breath on exertion, which is related to his Aplastic anemia / Pancytopenia. This letter is to be sent to him via mychart and faxed to his lawyer at [phone]. He states he can walk short distances but has issues if he has to walk far (50 feet) or up many stairs. Will provide letter stating this and that he would need wheelchair or to her assistance to traverse greater distances. - Letter provided and faxed

## 2020-02-18 ENCOUNTER — Telehealth: Payer: Self-pay | Admitting: Hematology

## 2020-02-18 ENCOUNTER — Telehealth: Payer: Self-pay | Admitting: Internal Medicine

## 2020-02-18 NOTE — Telephone Encounter (Signed)
Pls contact pt sister; she drop off POA forms with [employer] pls contact [phone]

## 2020-02-18 NOTE — Telephone Encounter (Signed)
No problem. We do the best we can.  Thanks.

## 2020-02-18 NOTE — Telephone Encounter (Signed)
I had a lengthy conversation with the patient's sister, and strongly recommended the patient to go to [facility] for evaluation by hematologist to rule out any other blood disorders, as his current work-up so far has been unrevealing.  We have tried to assist the patient's appointment at [facility] on several occasions, the patient had declined to go.  The patient's sister expressed understanding, and will discuss with him further.

## 2020-02-18 NOTE — Telephone Encounter (Signed)
Spoke to pt's sister, she would like to know what dr Dion Body believes will be the path of pt's care. She would like as his POA have a clear understanding of pt's meds from dr zhao, treatments, visit schedule and anything else that will help pt improve. I have tried to call dr zhao's nurse and the phone system at [facility] is not working correctly this am. Sherron Monday to a pharmacist who will send dr zhao's nurse and dr Dion Body a note with sister's ph#

## 2020-02-18 NOTE — Telephone Encounter (Signed)
Thank you for your efforts on getting him over to [facility]!

## 2020-02-18 NOTE — Telephone Encounter (Signed)
I received a phone call from Dr Dion Body asking that we contact patient about referral to [facility].  He stated patient was requesting to go to [facility].  Per Dr Dion Body patient needs to be seen by [facility].  Patient and his wife are now in agreement with Dr Dion Body concerning this referral.  I will fax over all notes to [facility] today. I have sent an in basket message to Dr. Dion Body as well.

## 2020-02-20 ENCOUNTER — Ambulatory Visit (INDEPENDENT_AMBULATORY_CARE_PROVIDER_SITE_OTHER): Payer: Self-pay | Admitting: Internal Medicine

## 2020-02-20 ENCOUNTER — Encounter: Payer: Self-pay | Admitting: Internal Medicine

## 2020-02-20 ENCOUNTER — Other Ambulatory Visit: Payer: Self-pay

## 2020-02-20 DIAGNOSIS — F630 Pathological gambling: Secondary | ICD-10-CM

## 2020-02-20 DIAGNOSIS — D61818 Other pancytopenia: Secondary | ICD-10-CM

## 2020-02-20 DIAGNOSIS — R634 Abnormal weight loss: Secondary | ICD-10-CM | POA: Insufficient documentation

## 2020-02-20 NOTE — Assessment & Plan Note (Signed)
Anorexia and weight loss: Pancytopenia: Patient calls today to discuss loss of appetite, former rash, joint aches, muscle aches and weight loss. He has lost at least 125lbs since September 2020 with a rash.  He has undergone an extensive laboratory evaluation and has been to see Hematology/Oncology Dr. Maylon Peppers. Dr. Maylon Peppers was most concerned for possible St[place] given his otherwise unremarkable workup as below. Unfortunately, Rheumatology did not agree to see the patient despite Dr. Lorette Ang appeal that they do.  Etiology uncertain. He is to see Heme/Onc at [facility] on 3/29.   Plan: He will need a diagnosis prior to treatment and given his appointment three days from now we will not start glucocorticoids  I do feel that [place] should be ruled out and agree to the referral to Heme/Onc I will refer her to a different Rheumatologist today as well  I advised him on staying hydrated and to continue to consume at least 3000 calories daily.  He has a positive CMV Ab IgG in 11/24/2020. ANA negative 12/29. Sed rate elevated 57. HIV negative.  ? Infectious work-up, including HIV, Hep B/C, CMV, EBV, syphilis, Lyme disease, and COVID, negative ? TSH, free T4 normal  ? Nutritional studies: iron 41, TIBC 178, saturation 23%, ferritin 2217 and >6k on repeat ? ESR 57; ANA negative  ? Hepatic steatosis on CT, but no other abnormality on CT CAP ? Gastric emptying study normal  ? Bone marrow bx: hypocellular marrow with mild megakaryocytic atypia; no leukemia, lymphoma or high-grade dysplasia identified; karyotype 46,XY[20]

## 2020-02-20 NOTE — Progress Notes (Signed)
  [facility] Internal Medicine Residency Telephone Encounter Continuity Care Appointment  HPI:   This telephone encounter was created for [name] on 02/20/2020 for the following purpose/cc poor oral intake.   Past Medical History:  Past Medical History:  Diagnosis Date  . Hypertension       ROS:   Negative except as per HPI.   Assessment / Plan / Recommendations:   Please see A&P under problem oriented charting for assessment of the patient's acute and chronic medical conditions.   As always, pt is advised that if symptoms worsen or new symptoms arise, they should go to an urgent care facility or to to ER for further evaluation.   Consent and Medical Decision Making:   Patient discussed with Dr. Antony Contras  This is a telephone encounter between Andrew Byrd and Andrew Byrd on 02/20/2020 for weight loss and weakenss. The visit was conducted with the patient located at home and [employer] at [facility]. The patient's identity was confirmed using their DOB and current address. The patient has consented to being evaluated through a telephone encounter and understands the associated risks (an examination cannot be done and the patient may need to come in for an appointment) / benefits (allows the patient to remain at home, decreasing exposure to coronavirus). I personally spent 15 minutes on medical discussion.

## 2020-02-20 NOTE — Addendum Note (Signed)
Addended by: Evelena Leyden on: 02/20/2020 12:45 PM   Modules accepted: Orders

## 2020-02-23 NOTE — Progress Notes (Signed)
Internal Medicine Clinic Attending  Case discussed with Dr. Harbrecht at the time of the visit.  We reviewed the resident's history and exam and pertinent patient test results.  I agree with the assessment, diagnosis, and plan of care documented in the resident's note.   

## 2020-02-25 MED ORDER — BENZOCAINE-MENTHOL 6-10 MG MT LOZG
1.00 | LOZENGE | OROMUCOSAL | Status: DC
Start: ? — End: 2020-02-25

## 2020-02-25 MED ORDER — ONDANSETRON 4 MG PO TBDP
4.00 | ORAL_TABLET | ORAL | Status: DC
Start: ? — End: 2020-02-25

## 2020-02-25 MED ORDER — FOLIC ACID 1 MG PO TABS
2.00 | ORAL_TABLET | ORAL | Status: DC
Start: 2020-03-09 — End: 2020-02-25

## 2020-02-25 MED ORDER — MAGNESIUM OXIDE 400 MG PO TABS
400.00 | ORAL_TABLET | ORAL | Status: DC
Start: 2020-02-25 — End: 2020-02-25

## 2020-02-25 MED ORDER — QUINTABS PO TABS
1.00 | ORAL_TABLET | ORAL | Status: DC
Start: 2020-02-25 — End: 2020-02-25

## 2020-02-25 MED ORDER — ACETAMINOPHEN 325 MG PO TABS
650.00 | ORAL_TABLET | ORAL | Status: DC
Start: ? — End: 2020-02-25

## 2020-02-25 MED ORDER — GENERIC EXTERNAL MEDICATION
250.00 | Status: DC
Start: 2020-02-25 — End: 2020-02-25

## 2020-02-25 MED ORDER — ENOXAPARIN SODIUM 40 MG/0.4ML ~~LOC~~ SOLN
40.00 | SUBCUTANEOUS | Status: DC
Start: 2020-02-26 — End: 2020-02-25

## 2020-03-10 MED ORDER — AMLODIPINE BESYLATE 5 MG PO TABS
5.00 | ORAL_TABLET | ORAL | Status: DC
Start: 2020-03-09 — End: 2020-03-10

## 2020-03-10 MED ORDER — GENERIC EXTERNAL MEDICATION
2.50 | Status: DC
Start: ? — End: 2020-03-10

## 2020-03-10 MED ORDER — HYDROXYZINE HCL 25 MG PO TABS
25.00 | ORAL_TABLET | ORAL | Status: DC
Start: ? — End: 2020-03-10

## 2020-03-10 MED ORDER — HYDRALAZINE HCL 25 MG PO TABS
25.00 | ORAL_TABLET | ORAL | Status: DC
Start: ? — End: 2020-03-10

## 2020-03-10 MED ORDER — MYCOPHENOLATE MOFETIL 500 MG PO TABS
500.00 | ORAL_TABLET | ORAL | Status: DC
Start: 2020-03-08 — End: 2020-03-10

## 2020-03-10 MED ORDER — PYRIDOXINE HCL 100 MG/ML IJ SOLN
10.00 | INTRAMUSCULAR | Status: DC
Start: 2020-03-09 — End: 2020-03-10

## 2020-03-10 MED ORDER — SULFAMETHOXAZOLE-TRIMETHOPRIM 400-80 MG PO TABS
1.00 | ORAL_TABLET | ORAL | Status: DC
Start: 2020-03-09 — End: 2020-03-10

## 2020-03-10 MED ORDER — ZINC SULFATE 220 (50 ZN) MG PO CAPS
100.00 | ORAL_CAPSULE | ORAL | Status: DC
Start: 2020-03-09 — End: 2020-03-10

## 2020-03-10 MED ORDER — GENERIC EXTERNAL MEDICATION
1.50 | Status: DC
Start: 2020-03-09 — End: 2020-03-10

## 2020-03-10 MED ORDER — METHYLPREDNISOLONE SODIUM SUCC 125 MG IJ SOLR
80.00 | INTRAMUSCULAR | Status: DC
Start: 2020-03-09 — End: 2020-03-10

## 2020-03-10 MED ORDER — PANTOPRAZOLE SODIUM 40 MG PO TBEC
40.00 | DELAYED_RELEASE_TABLET | ORAL | Status: DC
Start: 2020-03-09 — End: 2020-03-10

## 2020-03-10 MED ORDER — ALUM & MAG HYDROXIDE-SIMETH 200-200-20 MG/5ML PO SUSP
30.00 | ORAL | Status: DC
Start: 2020-03-08 — End: 2020-03-10

## 2020-03-10 MED ORDER — THIAMINE HCL 100 MG PO TABS
100.00 | ORAL_TABLET | ORAL | Status: DC
Start: 2020-03-09 — End: 2020-03-10

## 2020-03-10 MED ORDER — GENERIC EXTERNAL MEDICATION
30.00 | Status: DC
Start: 2020-03-08 — End: 2020-03-10

## 2020-03-10 MED ORDER — ENOXAPARIN SODIUM 40 MG/0.4ML ~~LOC~~ SOLN
40.00 | SUBCUTANEOUS | Status: DC
Start: 2020-03-09 — End: 2020-03-10

## 2020-03-10 MED ORDER — ERGOCALCIFEROL 1.25 MG (50000 UT) PO CAPS
50000.00 | ORAL_CAPSULE | ORAL | Status: DC
Start: 2020-03-10 — End: 2020-03-10

## 2020-03-10 MED ORDER — BISACODYL 10 MG RE SUPP
10.00 | RECTAL | Status: DC
Start: ? — End: 2020-03-10

## 2020-03-10 MED ORDER — THERA-M PO TABS
1.00 | ORAL_TABLET | ORAL | Status: DC
Start: 2020-03-09 — End: 2020-03-10

## 2020-03-10 MED ORDER — WH PETROL-MINERAL OIL-LANOLIN 0.1-0.1 % OP OINT
TOPICAL_OINTMENT | OPHTHALMIC | Status: DC
Start: ? — End: 2020-03-10

## 2020-03-12 ENCOUNTER — Telehealth: Payer: Self-pay | Admitting: Hematology

## 2020-03-12 ENCOUNTER — Inpatient Hospital Stay: Payer: Self-pay

## 2020-03-12 ENCOUNTER — Inpatient Hospital Stay: Payer: Self-pay | Admitting: Hematology

## 2020-03-12 MED ORDER — HYDRALAZINE HCL 25 MG PO TABS
25.00 | ORAL_TABLET | ORAL | Status: DC
Start: ? — End: 2020-03-12

## 2020-03-12 MED ORDER — WH PETROL-MINERAL OIL-LANOLIN 0.1-0.1 % OP OINT
TOPICAL_OINTMENT | OPHTHALMIC | Status: DC
Start: ? — End: 2020-03-12

## 2020-03-12 MED ORDER — FOLIC ACID 1 MG PO TABS
2.00 | ORAL_TABLET | ORAL | Status: DC
Start: 2020-03-20 — End: 2020-03-12

## 2020-03-12 MED ORDER — ONDANSETRON 4 MG PO TBDP
4.00 | ORAL_TABLET | ORAL | Status: DC
Start: ? — End: 2020-03-12

## 2020-03-12 MED ORDER — HYDROXYZINE HCL 25 MG PO TABS
25.00 | ORAL_TABLET | ORAL | Status: DC
Start: ? — End: 2020-03-12

## 2020-03-12 MED ORDER — PYRIDOXINE HCL 100 MG/ML IJ SOLN
10.00 | INTRAMUSCULAR | Status: DC
Start: 2020-03-13 — End: 2020-03-12

## 2020-03-12 MED ORDER — ERGOCALCIFEROL 1.25 MG (50000 UT) PO CAPS
50000.00 | ORAL_CAPSULE | ORAL | Status: DC
Start: 2020-03-24 — End: 2020-03-12

## 2020-03-12 MED ORDER — AMLODIPINE BESYLATE 5 MG PO TABS
5.00 | ORAL_TABLET | ORAL | Status: DC
Start: 2020-03-20 — End: 2020-03-12

## 2020-03-12 MED ORDER — SULFAMETHOXAZOLE-TRIMETHOPRIM 400-80 MG PO TABS
1.00 | ORAL_TABLET | ORAL | Status: DC
Start: 2020-03-13 — End: 2020-03-12

## 2020-03-12 MED ORDER — MELATONIN 3 MG PO TABS
3.00 | ORAL_TABLET | ORAL | Status: DC
Start: ? — End: 2020-03-12

## 2020-03-12 MED ORDER — GENERIC EXTERNAL MEDICATION
Status: DC
Start: ? — End: 2020-03-12

## 2020-03-12 MED ORDER — GENERIC EXTERNAL MEDICATION
2.50 | Status: DC
Start: ? — End: 2020-03-12

## 2020-03-12 MED ORDER — QUINTABS PO TABS
1.00 | ORAL_TABLET | ORAL | Status: DC
Start: 2020-03-20 — End: 2020-03-12

## 2020-03-12 MED ORDER — ALUM & MAG HYDROXIDE-SIMETH 200-200-20 MG/5ML PO SUSP
30.00 | ORAL | Status: DC
Start: 2020-03-19 — End: 2020-03-12

## 2020-03-12 MED ORDER — MYCOPHENOLATE MOFETIL 500 MG PO TABS
500.00 | ORAL_TABLET | ORAL | Status: DC
Start: 2020-03-12 — End: 2020-03-12

## 2020-03-12 MED ORDER — ZINC SULFATE 220 (50 ZN) MG PO CAPS
100.00 | ORAL_CAPSULE | ORAL | Status: DC
Start: 2020-03-20 — End: 2020-03-12

## 2020-03-12 MED ORDER — BISACODYL 10 MG RE SUPP
10.00 | RECTAL | Status: DC
Start: ? — End: 2020-03-12

## 2020-03-12 MED ORDER — THIAMINE HCL 100 MG PO TABS
100.00 | ORAL_TABLET | ORAL | Status: DC
Start: 2020-03-20 — End: 2020-03-12

## 2020-03-12 MED ORDER — PANTOPRAZOLE SODIUM 40 MG PO TBEC
40.00 | DELAYED_RELEASE_TABLET | ORAL | Status: DC
Start: 2020-03-20 — End: 2020-03-12

## 2020-03-12 MED ORDER — GENERIC EXTERNAL MEDICATION
30.00 | Status: DC
Start: 2020-03-19 — End: 2020-03-12

## 2020-03-12 MED ORDER — GENERIC EXTERNAL MEDICATION
1.50 | Status: DC
Start: 2020-03-13 — End: 2020-03-12

## 2020-03-12 MED ORDER — ENOXAPARIN SODIUM 40 MG/0.4ML ~~LOC~~ SOLN
40.00 | SUBCUTANEOUS | Status: DC
Start: 2020-03-20 — End: 2020-03-12

## 2020-03-12 MED ORDER — ACETAMINOPHEN 500 MG PO TABS
1000.00 | ORAL_TABLET | ORAL | Status: DC
Start: ? — End: 2020-03-12

## 2020-03-12 MED ORDER — SENNOSIDES-DOCUSATE SODIUM 8.6-50 MG PO TABS
1.00 | ORAL_TABLET | ORAL | Status: DC
Start: 2020-03-12 — End: 2020-03-12

## 2020-03-12 NOTE — Telephone Encounter (Signed)
I spoke with the patient's sister, Ms. Andrew Byrd, who is the patient's healthcare power of attorney.  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  Patient had a very lengthy hospitalization, and was recently transition to inpatient physical rehabilitation.   He had been scheduled for follow-up appointment on 03/12/2020.  Due to the complexity of his diagnosis, he will likely require ongoing treatment and follow-up with a hematologic expert at [facility].  Therefore, I will cancel the patient's appointment at [facility] for now.  Once his diagnosis is confirmed, and if she requires ongoing treatment for HLH/MAS, we can coordinate with hematology at [facility] and see how we can be of assistance.   The patient's sister expressed understanding, and agreed with the plan.  She will call us back to set up a follow-up clinic appointment as needed.  Andrew Holms, MD 03/12/2020 9:46 AM

## 2020-03-19 MED ORDER — PYRIDOXINE HCL 100 MG PO TABS
100.00 | ORAL_TABLET | ORAL | Status: DC
Start: 2020-03-20 — End: 2020-03-19

## 2020-03-19 MED ORDER — HYDROCORTISONE ACETATE 25 MG RE SUPP
25.00 | RECTAL | Status: DC
Start: ? — End: 2020-03-19

## 2020-03-19 MED ORDER — AZATHIOPRINE 50 MG PO TABS
50.00 | ORAL_TABLET | ORAL | Status: DC
Start: 2020-03-20 — End: 2020-03-19

## 2020-03-19 MED ORDER — SENNOSIDES-DOCUSATE SODIUM 8.6-50 MG PO TABS
2.00 | ORAL_TABLET | ORAL | Status: DC
Start: 2020-03-19 — End: 2020-03-19

## 2020-03-19 MED ORDER — DOCUSATE SODIUM 283 MG RE ENEM
283.00 | ENEMA | RECTAL | Status: DC
Start: ? — End: 2020-03-19

## 2020-03-19 MED ORDER — GENERIC EXTERNAL MEDICATION
Status: DC
Start: ? — End: 2020-03-19

## 2020-03-19 MED ORDER — POTASSIUM CHLORIDE CRYS ER 20 MEQ PO TBCR
20.00 | EXTENDED_RELEASE_TABLET | ORAL | Status: DC
Start: ? — End: 2020-03-19

## 2020-03-26 ENCOUNTER — Encounter: Payer: Self-pay | Admitting: Internal Medicine

## 2020-03-26 ENCOUNTER — Ambulatory Visit (INDEPENDENT_AMBULATORY_CARE_PROVIDER_SITE_OTHER): Payer: Self-pay | Admitting: Internal Medicine

## 2020-03-26 ENCOUNTER — Other Ambulatory Visit: Payer: Self-pay

## 2020-03-26 VITALS — BP 130/107 | HR 81 | Temp 98.2°F | Ht 72.0 in | Wt 188.8 lb

## 2020-03-26 DIAGNOSIS — F419 Anxiety disorder, unspecified: Secondary | ICD-10-CM | POA: Insufficient documentation

## 2020-03-26 DIAGNOSIS — L8990 Pressure ulcer of unspecified site, unspecified stage: Secondary | ICD-10-CM | POA: Insufficient documentation

## 2020-03-26 DIAGNOSIS — I10 Essential (primary) hypertension: Secondary | ICD-10-CM | POA: Insufficient documentation

## 2020-03-26 DIAGNOSIS — T50905A Adverse effect of unspecified drugs, medicaments and biological substances, initial encounter: Secondary | ICD-10-CM

## 2020-03-26 DIAGNOSIS — R1013 Epigastric pain: Secondary | ICD-10-CM | POA: Insufficient documentation

## 2020-03-26 DIAGNOSIS — E46 Unspecified protein-calorie malnutrition: Secondary | ICD-10-CM

## 2020-03-26 MED ORDER — OMEPRAZOLE 20 MG PO CPDR: 20.0000 mg | DELAYED_RELEASE_CAPSULE | Freq: Every day | ORAL | 0 refills | Status: DC

## 2020-03-26 NOTE — Progress Notes (Signed)
   CC: drug induced dyspepsia, Malnutrition, Anxiety, Pressure Ulcer, HTN  HPI:  [name] is a 39 y.o. male with PMH below.  Today we will address drug induced dyspepsia, Malnutrition, Anxiety, Pressure Ulcer, HTN  Please see A&P for status of the patient's chronic medical conditions  Past Medical History:  Diagnosis Date  . Hypertension    Review of Systems:  ROS: Pulmonary: pt denies increased work of breathing, shortness of breath,  Cardiac: pt denies palpitations, chest pain,  Skin: Media Information   Document Information  Photos    03/26/2020 10:31  Attached To:  Office Visit on 03/26/20 with Angelita Ingles, MD  Source Information  Angelita Ingles, MD  [facility]      Physical Exam:  Vitals:   03/26/20 0944 03/26/20 0951  BP: (!) 139/112 (!) 130/107  Pulse: 93 81  Temp: 98.2 F (36.8 C)   TempSrc: Oral   SpO2: 100%   Weight: 188 lb 12.8 oz (85.6 kg)   Height: 6' (1.829 m)    Cardiac: normal rate and rhythm, clear s1 and s2, no murmurs, rubs or gallops Pulmonary: CTAB, not in distress Abdominal: non distended abdomen, soft and nontender Psych: Alert, conversant, in good spirits   Social History   Socioeconomic History  . Marital status: Single    Spouse name: Not on file  . Number of children: Not on file  . Years of education: Not on file  . Highest education level: Not on file  Occupational History  . Not on file  Tobacco Use  . Smoking status: Former Smoker    Types: Cigarettes    Quit date: 08/16/2019    Years since quitting: 0.6  . Smokeless tobacco: Never Used  Substance and Sexual Activity  . Alcohol use: No  . Drug use: Yes    Types: Marijuana  . Sexual activity: Yes    Partners: Female  Other Topics Concern  . Not on file  Social History Narrative  . Not on file   Social Determinants of Health   Financial Resource Strain:   . Difficulty of Paying Living Expenses:   Food Insecurity:   . Worried  About Programme researcher, broadcasting/film/video in the Last Year:   . Barista in the Last Year:   Transportation Needs:   . Freight forwarder (Medical):   Marland Kitchen Lack of Transportation (Non-Medical):   Physical Activity:   . Days of Exercise per Week:   . Minutes of Exercise per Session:   Stress:   . Feeling of Stress :   Social Connections:   . Frequency of Communication with Friends and Family:   . Frequency of Social Gatherings with Friends and Family:   . Attends Religious Services:   . Active Member of Clubs or Organizations:   . Attends Banker Meetings:   Marland Kitchen Marital Status:   Intimate Partner Violence:   . Fear of Current or Ex-Partner:   . Emotionally Abused:   Marland Kitchen Physically Abused:   . Sexually Abused:     Family History  Problem Relation Age of Onset  . Hypertension Other     Assessment & Plan:   See Encounters Tab for problem based charting.  Patient discussed with Dr. Rogelia Boga

## 2020-03-26 NOTE — Assessment & Plan Note (Signed)
has improved significantly since discharge.  A lot of his anxiety seemed to be from his surroundings and especially needle anxiety for blood draws.  He is using the PRN atarax very infrequently since discharge.  He declines behavioral health referral or other interventions today.  -continue to monitor

## 2020-03-26 NOTE — Assessment & Plan Note (Signed)
Important to continue working on this during his recovery.  Currently his appetite is good but he attributes this to the steroids.  He asks what he can do to help him gain weight.  -discharge summary recommended pt take ensure 3 times daily.  He will begin with taking one shake 2 hours after each meal.

## 2020-03-26 NOTE — Assessment & Plan Note (Signed)
Pressure ulcer has improved significantly.  They request a different type of cushion for his wheelchair which I agree is appropriate. Photo attached to this note.    -they will switch to memory foam cushion -he has increased his activity and is rotating off the area -continue barrier cream

## 2020-03-26 NOTE — Progress Notes (Signed)
Internal Medicine Clinic Attending  Case discussed with Dr. Winfrey  at the time of the visit.  We reviewed the resident's history and exam and pertinent patient test results.  I agree with the assessment, diagnosis, and plan of care documented in the resident's note.  

## 2020-03-26 NOTE — Assessment & Plan Note (Signed)
Pt currently on amlodipine 5mg  daily.  Some of the worsening likely related to steroid taper.  We discussed increasing amlodipine.  They opted to reevaluate after steroid course complete.

## 2020-03-26 NOTE — Assessment & Plan Note (Signed)
Patient reports since starting steroid therapy he is sensitive to acidic foods and also some discomfort with PRN NSAID therapy.  -start pt on omeprazole 20mg  daily

## 2020-03-26 NOTE — Patient Instructions (Addendum)
Mr. Szabo, pleas purchase a memory foam pad for your wheelchair.  Continue exercise as tolerated.  I have written for an acid suppressing medicine for your stomach called omeprazole.  We will readress your blood pressure when you are off the steroid.  I recommend boost or ensure shakes for your malnutrition as we discussed 2 hours after meals.  Your pressure ulcer looks excellent keep using the barrier cream and staying active.

## 2020-04-15 ENCOUNTER — Ambulatory Visit: Payer: Self-pay

## 2020-04-15 ENCOUNTER — Telehealth: Payer: Self-pay | Admitting: *Deleted

## 2020-04-15 NOTE — Telephone Encounter (Signed)
Call from pt - stated when he woke up this morning his face was swollen -lips,eyes- and he had a h/a. He took Tylenol for the h/a which is better at this time. Denies difficult breathing; throat tightness,sob.Stated he has taken any new medications nor allergic to any foods. No open appts except @ 1015 Am and he lives in [facility] - stated he cannot get here in time. Informed to go to UC; stated he does not want to go there b/c they never do anything for him. Stated he does have and will take Benadryl.  [facility] appt given today @ 1315PM - explained to pt if his condition worsens, becomes sob.difficutly breathing to call 911 or go to UC/ER -stated he will.

## 2020-04-16 ENCOUNTER — Ambulatory Visit (INDEPENDENT_AMBULATORY_CARE_PROVIDER_SITE_OTHER): Payer: 59 | Admitting: Internal Medicine

## 2020-04-16 ENCOUNTER — Other Ambulatory Visit: Payer: Self-pay

## 2020-04-16 VITALS — BP 144/108 | HR 70 | Temp 98.7°F | Ht 72.0 in | Wt 219.8 lb

## 2020-04-16 DIAGNOSIS — D761 Hemophagocytic lymphohistiocytosis: Secondary | ICD-10-CM | POA: Diagnosis not present

## 2020-04-16 DIAGNOSIS — I10 Essential (primary) hypertension: Secondary | ICD-10-CM | POA: Diagnosis not present

## 2020-04-16 DIAGNOSIS — R220 Localized swelling, mass and lump, head: Secondary | ICD-10-CM | POA: Diagnosis not present

## 2020-04-16 HISTORY — DX: Hemophagocytic lymphohistiocytosis: D76.1

## 2020-04-16 LAB — BASIC METABOLIC PANEL
Anion gap: 11 (ref 5–15)
BUN: 15 mg/dL (ref 6–20)
CO2: 19 mmol/L — ABNORMAL LOW (ref 22–32)
Calcium: 8.7 mg/dL — ABNORMAL LOW (ref 8.9–10.3)
Chloride: 108 mmol/L (ref 98–111)
Creatinine, Ser: 0.9 mg/dL (ref 0.61–1.24)
GFR calc Af Amer: >60 mL/min (ref >60)
GFR calc non Af Amer: >60 mL/min (ref >60)
Glucose, Bld: 70 mg/dL (ref 70–99)
Potassium: 4.1 mmol/L (ref 3.5–5.1)
Sodium: 138 mmol/L (ref 135–145)

## 2020-04-16 NOTE — Progress Notes (Signed)
   CC: Facial Swelling  HPI:  [name] is a 39 y.o. 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 Please see problem based charting for evaluation, assessment, and plan.  Past Medical History:  Diagnosis Date  . Hypertension    Review of Systems:    Denies fevers, chills, nausea, vomiting, abdominal pain  Physical Exam:  Vitals:   04/16/20 0928 04/16/20 0935  BP: (!) 146/110 (!) 144/108  Pulse: 76 70  Temp: 98.7 F (37.1 C)   TempSrc: Oral   SpO2: 100%   Weight: 219 lb 12.8 oz (99.7 kg)   Height: 6' (1.829 m)    Physical Exam  Constitutional: He appears well-developed and well-nourished. No distress.  HENT:  Head: Normocephalic and atraumatic.  Eyes: Conjunctivae are normal.  Cardiovascular: Normal rate, regular rhythm and normal heart sounds.  Respiratory: Effort normal and breath sounds normal. No respiratory distress. He has no wheezes.  GI: Soft.  Musculoskeletal:        General: No edema.     Comments: 5/5 strength in prox and distal lower extremities  Neurological: He is alert.  Skin: He is not diaphoretic.  Psychiatric: His mood appears anxious.   Assessment & Plan:   See Encounters Tab for problem based charting.  Patient discussed with Dr. Rogelia Boga

## 2020-04-16 NOTE — Patient Instructions (Signed)
It was a pleasure to see you today Mr. Kizzie Bane. Please make the following changes:  -please monitor for any other episodes of sudden onset swelling -follow up in 2 weeks for bp recheck  -follow up with rheumatology -a basic metabolic panel was collected during this visit   If you have any questions or concerns, please call our clinic at [phone] between 9am-5pm and after hours call [phone] and ask for the internal medicine resident on call. If you feel you are having a medical emergency please call 911.   Thank you, we look forward to help you remain healthy!  Lorenso Courier, MD Internal Medicine PGY3   To schedule an appointment for a COVID vaccine or be added to the vaccine wait list: Go to [email or web address]   OR Go to [email or web address]                  OR Call [phone]                                     OR Call [phone] and select Option 2  [address] [facility] [place]) [place] [address] [facility] [place], [place] [address] Hours: Mon,Thu 8-5, Sat 8-12 Type: Pfizer (12+)  [address]  [facility]) [place] [employer] [address] [place], [place] [address] Hours: Thu: 1-5 Type: Pfizer (12+)

## 2020-04-16 NOTE — Assessment & Plan Note (Signed)
Patient was last seen by rheumatology on 04/08/2020.  Was placed on a prednisone taper, told to increase Imuran from 50 to 100 mg daily.  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  -Continue Imuran 100 mg daily -Prednisone taper as follows  35 mg daily for 1 week, then  30 mg daily for 1 week, then  25 mg daily for 1 week, then  20 mg daily for 1 week, then  17.5 mg daily for 1 week, then  15 mg daily for 1 week,   12.5 mg daily for 1 week,   10 mg daily for 1 week, then  Decrease by 1 mg every two weeks reaching 5 mg/day -Follow-up with rheumatology

## 2020-04-16 NOTE — Progress Notes (Signed)
Internal Medicine Clinic Attending  Case discussed with Dr. Chundi at the time of the visit.  We reviewed the resident's history and exam and pertinent patient test results.  I agree with the assessment, diagnosis, and plan of care documented in the resident's note. 

## 2020-04-16 NOTE — Assessment & Plan Note (Addendum)
Patient's blood pressure during this visit is 144/108.  He is currently taking amlodipine 5 mg daily.  Assessment and plan The patient was encouraged to make sure that he is adherent to the medication. Follow up in 2 weeks.   -will check bmp

## 2020-04-16 NOTE — Assessment & Plan Note (Signed)
Patient states that he woke up yesterday 04/15/2020 with swollen face, lips and a severe headache.  He states that this lasted for minutes to 1 hour and was alleviated with Tylenol.  He did not take any new medications or eat any new foods that he is allergic to.  He did not have any airway stenosis, dyspnea, chest pain.  Today, patient continues to not have any facial swelling.  Assessment and plan Patient is not on any ACE or ARB or any other medications that can cause sudden facial swelling.  He does not know of any food substances or environmental changes that could have contributed to his swelling.  It is possible that the swelling is secondary to prednisone that he is on.  He also has generalized weight gain as noted by the patient and his girlfriend. No fevers or chills to suggest infectious process.   -will monitor for reoccurence

## 2020-04-20 ENCOUNTER — Other Ambulatory Visit: Payer: Self-pay | Admitting: *Deleted

## 2020-04-20 MED ORDER — AMLODIPINE BESYLATE 5 MG PO TABS: 5.0000 mg | ORAL_TABLET | Freq: Every day | ORAL | 1 refills | Status: DC

## 2020-04-20 MED ORDER — ZINC SULFATE 220 (50 ZN) MG PO CAPS: 220.0000 mg | ORAL_CAPSULE | Freq: Every day | ORAL | 1 refills | Status: DC

## 2020-04-20 MED ORDER — VITAMIN B-1 100 MG PO TABS: 100.0000 mg | ORAL_TABLET | Freq: Every day | ORAL | 1 refills | Status: DC

## 2020-04-20 MED ORDER — FOLIC ACID 1 MG PO TABS: 1.0000 mg | ORAL_TABLET | Freq: Every day | ORAL | 1 refills | Status: DC

## 2020-04-20 MED ORDER — PANTOPRAZOLE SODIUM 40 MG PO TBEC: 40.0000 mg | DELAYED_RELEASE_TABLET | Freq: Every day | ORAL | 1 refills | Status: DC

## 2020-04-20 MED ORDER — ERGOCALCIFEROL 1.25 MG (50000 UT) PO CAPS: 50000.0000 [IU] | ORAL_CAPSULE | ORAL | 1 refills | Status: DC

## 2020-04-20 NOTE — Telephone Encounter (Signed)
Pt was seen in [facility] on 5/21.

## 2020-04-21 ENCOUNTER — Telehealth: Payer: Self-pay | Admitting: Internal Medicine

## 2020-04-21 NOTE — Telephone Encounter (Signed)
Dr Delma Officer saw him for came issue 5/21. Agree with ED / [phone]

## 2020-04-21 NOTE — Telephone Encounter (Signed)
Pt's sig other calls and states for the last 2 mornings he has awaken with swollen face and today mouth. She is advised of ED she states pt has a [facility], refuses ED and clinic appts. She is advised to have dr at [facility] for f/u to advise. Advised that if pt has short breath, chest pain, h/a, vision changes, speech changes, to call 911 asap. She is agreeable

## 2020-04-22 ENCOUNTER — Ambulatory Visit (INDEPENDENT_AMBULATORY_CARE_PROVIDER_SITE_OTHER): Payer: 59 | Admitting: Internal Medicine

## 2020-04-22 ENCOUNTER — Encounter: Payer: Self-pay | Admitting: Internal Medicine

## 2020-04-22 VITALS — BP 152/103 | HR 67 | Temp 98.4°F | Ht 72.0 in | Wt 225.0 lb

## 2020-04-22 DIAGNOSIS — B029 Zoster without complications: Secondary | ICD-10-CM | POA: Diagnosis not present

## 2020-04-22 DIAGNOSIS — B0230 Zoster ocular disease, unspecified: Secondary | ICD-10-CM

## 2020-04-22 DIAGNOSIS — I10 Essential (primary) hypertension: Secondary | ICD-10-CM | POA: Diagnosis not present

## 2020-04-22 MED ORDER — VALACYCLOVIR HCL 1 G PO TABS: 1000.0000 mg | ORAL_TABLET | Freq: Three times a day (TID) | ORAL | 0 refills | Status: AC

## 2020-04-22 NOTE — Patient Instructions (Addendum)
Andrew Byrd,  It was a pleasure seeing you in clinic. Today we discussed:   Facial swelling and rash: Your facial swelling and rash appears to be secondary to shingles.  At this time, I have sent a prescription to your pharmacy for valacyclovir.  Please take 1000 mg 3 times daily for the next 10 days.  I have also sent an urgent referral to the ophthalmologist.   Elevated BP: Your blood pressure was elevated at this visit.  Please take your blood pressure medication as prescribed.  Follow-up in 4 weeks for blood pressure recheck   If you have any questions or concerns, please call our clinic at [phone] between 9am-5pm and after hours call [phone] and ask for the internal medicine resident on call. If you feel you are having a medical emergency please call 911.   Thank you, we look forward to helping you remain healthy!  If you have not already done so, I recommend getting the Covid vaccine To schedule an appointment for a COVID vaccine or be added to the vaccine wait list: Go to [email or web address]   OR Go to [email or web address]                  OR Call [phone]                                     OR Call [phone] and select Option 2    Shingles  Shingles, which is also known as herpes zoster, is an infection that causes a painful skin rash and fluid-filled blisters. It is caused by a virus. Shingles only develops in people who:  Have had chickenpox.  Have been given a medicine to protect against chickenpox (have been vaccinated). Shingles is rare in this group. What are the causes? Shingles is caused by varicella-zoster virus (VZV). This is the same virus that causes chickenpox. After a person is exposed to VZV, the virus stays in the body in an inactive (dormant) state. Shingles develops if the virus is reactivated. This can happen many years after the first (initial) exposure to VZV. It is not known what causes this virus to be reactivated. What  increases the risk? People who have had chickenpox or received the chickenpox vaccine are at risk for shingles. Shingles infection is more common in people who:  Are older than age 69.  Have a weakened disease-fighting system (immune system), such as people with: ? HIV. ? AIDS. ? Cancer.  Are taking medicines that weaken the immune system, such as transplant medicines.  Are experiencing a lot of stress. What are the signs or symptoms? Early symptoms of this condition include itching, tingling, and pain in an area on your skin. Pain may be described as burning, stabbing, or throbbing. A few days or weeks after early symptoms start, a painful red rash appears. The rash is usually on one side of the body and has a band-like or belt-like pattern. The rash eventually turns into fluid-filled blisters that break open, change into scabs, and dry up in about 2-3 weeks. At any time during the infection, you may also develop:  A fever.  Chills.  A headache.  An upset stomach. How is this diagnosed? This condition is diagnosed with a skin exam. Skin or fluid samples may be taken from the blisters before a diagnosis is made. These samples are examined under a microscope or sent to a lab for testing.  How is this treated? The rash may last for several weeks. There is not a specific cure for this condition. Your health care provider will probably prescribe medicines to help you manage pain, recover more quickly, and avoid long-term problems. Medicines may include:  Antiviral drugs.  Anti-inflammatory drugs.  Pain medicines.  Anti-itching medicines (antihistamines). If the area involved is on your face, you may be referred to a specialist, such as an eye doctor (ophthalmologist) or an ear, nose, and throat (ENT) doctor (otolaryngologist) to help you avoid eye problems, chronic pain, or disability. Follow these instructions at home: Medicines  Take over-the-counter and prescription medicines  only as told by your health care provider.  Apply an anti-itch cream or numbing cream to the affected area as told by your health care provider. Relieving itching and discomfort   Apply cold, wet cloths (cold compresses) to the area of the rash or blisters as told by your health care provider.  Cool baths can be soothing. Try adding baking soda or dry oatmeal to the water to reduce itching. Do not bathe in hot water. Blister and rash care  Keep your rash covered with a loose bandage (dressing). Wear loose-fitting clothing to help ease the pain of material rubbing against the rash.  Keep your rash and blisters clean by washing the area with mild soap and cool water as told by your health care provider.  Check your rash every day for signs of infection. Check for: ? More redness, swelling, or pain. ? Fluid or blood. ? Warmth. ? Pus or a bad smell.  Do not scratch your rash or pick at your blisters. To help avoid scratching: ? Keep your fingernails clean and cut short. ? Wear gloves or mittens while you sleep, if scratching is a problem. General instructions  Rest as told by your health care provider.  Keep all follow-up visits as told by your health care provider. This is important.  Wash your hands often with soap and water. If soap and water are not available, use hand sanitizer. Doing this lowers your chance of getting a bacterial skin infection.  Before your blisters change into scabs, your shingles infection can cause chickenpox in people who have never had it or have never been vaccinated against it. To prevent this from happening, avoid contact with other people, especially: ? Babies. ? Pregnant women. ? Children who have eczema. ? Elderly people who have transplants. ? People who have chronic illnesses, such as cancer or AIDS. Contact a health care provider if:  Your pain is not relieved with prescribed medicines.  Your pain does not get better after the rash  heals.  You have signs of infection in the rash area, such as: ? More redness, swelling, or pain around the rash. ? Fluid or blood coming from the rash. ? The rash area feeling warm to the touch. ? Pus or a bad smell coming from the rash. Get help right away if:  The rash is on your face or nose.  You have facial pain, pain around your eye area, or loss of feeling on one side of your face.  You have difficulty seeing.  You have ear pain or have ringing in your ear.  You have a loss of taste.  Your condition gets worse. Summary  Shingles, which is also known as herpes zoster, is an infection that causes a painful skin rash and fluid-filled blisters.  This condition is diagnosed with a skin exam. Skin or fluid samples may  be taken from the blisters and examined before the diagnosis is made.  Keep your rash covered with a loose bandage (dressing). Wear loose-fitting clothing to help ease the pain of material rubbing against the rash.  Before your blisters change into scabs, your shingles infection can cause chickenpox in people who have never had it or have never been vaccinated against it. This information is not intended to replace advice given to you by your health care provider. Make sure you discuss any questions you have with your health care provider. Document Revised: 03/07/2019 Document Reviewed: 07/18/2017 Elsevier Patient Education  2020 [employer].

## 2020-04-22 NOTE — Progress Notes (Addendum)
Internal Medicine Clinic Attending  Case discussed with Dr. Mcarthur Rossetti at the time of the visit.  We reviewed the resident's history and exam and pertinent patient test results.  I agree with the assessment, diagnosis, and plan of care documented in the resident's note.     Patient is immunocompromised and should be treated with valacyclovir even though he is out of the 72-hour window.  Patient also has lesions in the V1 region even though he has no lesions at the tip of his nose or on the side of his nose or on his face he will need urgent referral to ophthalmology to rule out herpes zoster ophthalmicus.  Case discussed with Dr. Mcarthur Rossetti who will call ophthalmology to get patient an appointment today.

## 2020-04-22 NOTE — Assessment & Plan Note (Signed)
Patient presenting with approximately 1 week of worsening facial swelling and 3 to 4 days of rash on left side of forehead.  He does endorse some pain in the area and generalized fatigue.  He denies any fevers, chills, joint pain, difficulty rising from seated position, difficulty raising arms, neck stiffness or pain.  No vision changes, eye pain, hearing changes, ear pain, dysphagia.  On examination, has a vesicular rash in the V1 dermatome, extending to the eyelid.  No rash noted at tip of nose.  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  Differential diagnoses also includes dermatomyositis flare.  However, in absence of other symptoms including fever, chills, joint pain, proximal muscle weakness, dysphagia this is less likely.  Plan Valacyclovir 1 g 3 times daily for 10 days Urgent referral to ophthalmology Follow-up with rheumatologist

## 2020-04-22 NOTE — Assessment & Plan Note (Signed)
BP 152/103 at this visit.  Patient is on amlodipine 5 mg daily, however notes that he has not been taking his medication since Sunday.  Unable to clarify reason.  Plan Encourage medication adherence BP check in 4 weeks

## 2020-04-22 NOTE — Progress Notes (Signed)
   CC: facial swelling and rash  HPI:  [name] is a 39 y.o. 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 Patient denies any vision changes, hearing changes, joint pain, no fevers or chills. Please see problem based charting for further assessment and plan.   Past Medical History:  Diagnosis Date  . Hypertension    Review of Systems:  Negative except as stated in HPI.   Physical Exam:  Vitals:   04/22/20 0934  BP: (!) 152/103  Pulse: 67  Temp: 98.4 F (36.9 C)  TempSrc: Oral  SpO2: 100%  Weight: 225 lb (102.1 kg)  Height: 6' (1.829 m)   Physical Exam Constitutional:      General: He is not in acute distress.    Appearance: Normal appearance. He is not diaphoretic.  HENT:     Head: Normocephalic.     Mouth/Throat:     Mouth: Mucous membranes are moist.     Pharynx: Oropharynx is clear.  Cardiovascular:     Rate and Rhythm: Normal rate and regular rhythm.     Pulses: Normal pulses.     Heart sounds: Normal heart sounds.  Skin:    General: Skin is warm and dry.     Capillary Refill: Capillary refill takes less than 2 seconds.     Findings: Rash present.     Comments: See below  Neurological:     General: No focal deficit present.     Mental Status: He is alert and oriented to person, place, and time. Mental status is at baseline.     Cranial Nerves: No cranial nerve deficit.     Sensory: No sensory deficit.     Motor: No weakness.   ,    Assessment & Plan:   See Encounters Tab for problem based charting.  Patient discussed with Dr. Heide Spark

## 2020-04-30 ENCOUNTER — Ambulatory Visit: Payer: 59

## 2020-05-14 ENCOUNTER — Ambulatory Visit: Payer: 59

## 2020-05-19 ENCOUNTER — Ambulatory Visit (INDEPENDENT_AMBULATORY_CARE_PROVIDER_SITE_OTHER): Payer: 59 | Admitting: Internal Medicine

## 2020-05-19 ENCOUNTER — Other Ambulatory Visit: Payer: Self-pay

## 2020-05-19 ENCOUNTER — Encounter: Payer: Self-pay | Admitting: Internal Medicine

## 2020-05-19 VITALS — BP 151/96 | HR 88 | Temp 98.0°F | Ht 72.0 in | Wt 240.8 lb

## 2020-05-19 DIAGNOSIS — R600 Localized edema: Secondary | ICD-10-CM | POA: Diagnosis not present

## 2020-05-19 DIAGNOSIS — D761 Hemophagocytic lymphohistiocytosis: Secondary | ICD-10-CM

## 2020-05-19 DIAGNOSIS — I10 Essential (primary) hypertension: Secondary | ICD-10-CM

## 2020-05-19 DIAGNOSIS — B029 Zoster without complications: Secondary | ICD-10-CM

## 2020-05-19 DIAGNOSIS — D61818 Other pancytopenia: Secondary | ICD-10-CM

## 2020-05-19 DIAGNOSIS — Z7952 Long term (current) use of systemic steroids: Secondary | ICD-10-CM

## 2020-05-19 MED ORDER — LOSARTAN POTASSIUM 25 MG PO TABS: 25.0000 mg | ORAL_TABLET | Freq: Every day | ORAL | 1 refills | Status: DC

## 2020-05-19 NOTE — Patient Instructions (Addendum)
It was nice seeing you today! Thank you for choosing [facility] for your Primary Care.    Today we talked about:   1. High blood pressure: I have added another medication called Losartan. Please take this new medication and Amlodipine daily. We will need to get lab work in 2 weeks. Please make a lab only appointment for that. Let's follow up in the clinic in 4 weeks.

## 2020-05-19 NOTE — Progress Notes (Signed)
   CC: HTN follow up  HPI:  [name] is a 39 y.o. with a PMHx of dermatomyositis, recent shingles who presents to the clinic for HTN follow up.   Please see the Encounters tab for problem-based Assessment & Plan regarding status of patient's chronic conditions.  Past Medical History:  Diagnosis Date  . Hypertension    Review of Systems: Review of Systems  Constitutional: Negative for chills, fever, malaise/fatigue and weight loss.  Respiratory: Negative for cough.   Cardiovascular: Positive for leg swelling. Negative for chest pain, palpitations, orthopnea and claudication.  Gastrointestinal: Negative for abdominal pain, diarrhea, nausea and vomiting.  Skin:       Hyperpigmentation of fingers   Physical Exam:  Vitals:   05/19/20 0915  BP: (!) 151/96  Pulse: 88  Temp: 98 F (36.7 C)  TempSrc: Oral  SpO2: 100%  Weight: 240 lb 12.8 oz (109.2 kg)  Height: 6' (1.829 m)   Physical Exam Vitals and nursing note reviewed.  Constitutional:      General: He is not in acute distress.    Appearance: He is obese.  HENT:     Head: Normocephalic and atraumatic.  Cardiovascular:     Rate and Rhythm: Normal rate and regular rhythm.     Heart sounds: No murmur heard.  No gallop.   Pulmonary:     Effort: Pulmonary effort is normal. No respiratory distress.     Breath sounds: No wheezing, rhonchi or rales.  Musculoskeletal:     Right lower leg: Edema (1+ pitting) present.     Left lower leg: Edema (1+ pitting) present.  Skin:    General: Skin is warm and dry.     Comments: Mild hyperpigmentation of the finger tips on bilateral hands. No lesions or rash.  Herpes zoster rash from previous office visit has healed without scarring  Neurological:     General: No focal deficit present.     Mental Status: He is alert and oriented to person, place, and time. Mental status is at baseline.  Psychiatric:        Mood and Affect: Mood normal.        Behavior: Behavior normal.     Assessment & Plan:   See Encounters Tab for problem based charting.  Patient discussed with Dr. Sandre Kitty

## 2020-05-19 NOTE — Assessment & Plan Note (Signed)
Andrew Byrd states his rash has resolved completely. He denies any pain in the region.   Assessment/Plan:  Resolved. No further intervention at this time.

## 2020-05-19 NOTE — Assessment & Plan Note (Signed)
BP: (!) 151/96  [name] states his GF takes care of his medications, but that has far as he knows, he is taking his Amlodipine daily. He endorses bilateral lower extremity swelling that is causing some discomfort. He denies any chest pain, palpitations, orthopnea, claudication sxs.   Assessment/Plan:  Long term hx of HTN, likely in the past from obesity. At this time, secondary to a combination of chronic steroid use with recent weight gain (~50 lbs in 2 months). His leg swelling is likely secondary to prednisone use with current dose of 15 mg per day. He does have a hx of facial swelling but unrelated to ACEi or ARB use. At this time, will start on Losartan and continue Amlodipine.   - Continue Amlodipine 5 mg  - Start Losartan 25 mg daily - BMP in 2 weeks - Follow up OV in 4 weeks

## 2020-05-20 NOTE — Progress Notes (Signed)
Internal Medicine Clinic Attending  Case discussed with Dr. Huel Cote at the time of the visit.  We reviewed the resident's history and exam and pertinent patient test results.  I agree with the assessment, diagnosis, and plan of care documented in the resident's note.  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 Followed by Heme and GI at [facility]. Given high risk for PRES with HLH, BP control is paramount. Will follow closely. Ideally would recheck BMP, LFTs, CBC today, but will hold off per his request. Will need continued close follow up.   Jessy Oto, M.D., Ph.D.

## 2020-06-02 ENCOUNTER — Other Ambulatory Visit: Payer: 59

## 2020-06-09 ENCOUNTER — Ambulatory Visit (INDEPENDENT_AMBULATORY_CARE_PROVIDER_SITE_OTHER): Payer: 59 | Admitting: Internal Medicine

## 2020-06-09 ENCOUNTER — Encounter: Payer: Self-pay | Admitting: Internal Medicine

## 2020-06-09 ENCOUNTER — Other Ambulatory Visit: Payer: Self-pay

## 2020-06-09 DIAGNOSIS — I10 Essential (primary) hypertension: Secondary | ICD-10-CM

## 2020-06-09 DIAGNOSIS — D61818 Other pancytopenia: Secondary | ICD-10-CM

## 2020-06-09 DIAGNOSIS — R109 Unspecified abdominal pain: Secondary | ICD-10-CM

## 2020-06-09 MED ORDER — PREDNISONE 1 MG PO TABS
1.0000 mg | ORAL_TABLET | Freq: Every day | ORAL | 1 refills | Status: DC
Start: 2020-06-09 — End: 2020-06-18

## 2020-06-09 NOTE — Patient Instructions (Signed)
Andrew Byrd,  It was nice meeting you.   Today we discussed your blood pressure which is improved from last visit. I suspect it may continue to improve when you come down on the Prednisone so we will not make any medication changes today.   The Prednisone taper should continue as follows:  Decrease by 1 mg every 2 weeks until you get to 5 mg daily.   Take care, Dr. Chesley Mires

## 2020-06-10 LAB — CMP14 + ANION GAP
ALT: 16 IU/L (ref 0–44)
AST: 19 IU/L (ref 0–40)
Albumin/Globulin Ratio: 1.8 (ref 1.2–2.2)
Albumin: 4 g/dL (ref 4.0–5.0)
Alkaline Phosphatase: 52 IU/L (ref 48–121)
Anion Gap: 13 mmol/L (ref 10.0–18.0)
BUN/Creatinine Ratio: 14 (ref 9–20)
BUN: 11 mg/dL (ref 6–20)
Bilirubin Total: 0.5 mg/dL (ref 0.0–1.2)
CO2: 22 mmol/L (ref 20–29)
Calcium: 9.3 mg/dL (ref 8.7–10.2)
Chloride: 106 mmol/L (ref 96–106)
Creatinine, Ser: 0.78 mg/dL (ref 0.76–1.27)
GFR calc Af Amer: 133 mL/min/{1.73_m2} (ref >59)
GFR calc non Af Amer: 115 mL/min/{1.73_m2} (ref >59)
Globulin, Total: 2.2 g/dL (ref 1.5–4.5)
Glucose: 96 mg/dL (ref 65–99)
Potassium: 4.4 mmol/L (ref 3.5–5.2)
Sodium: 141 mmol/L (ref 134–144)
Total Protein: 6.2 g/dL (ref 6.0–8.5)

## 2020-06-10 LAB — CBC WITH DIFFERENTIAL/PLATELET
Basophils Absolute: 0 10*3/uL (ref 0.0–0.2)
Basos: 0 %
EOS (ABSOLUTE): 0 10*3/uL (ref 0.0–0.4)
Eos: 1 %
Hematocrit: 34.7 % — ABNORMAL LOW (ref 37.5–51.0)
Hemoglobin: 11.8 g/dL — ABNORMAL LOW (ref 13.0–17.7)
Immature Grans (Abs): 0 10*3/uL (ref 0.0–0.1)
Immature Granulocytes: 0 %
Lymphocytes Absolute: 0.4 10*3/uL — ABNORMAL LOW (ref 0.7–3.1)
Lymphs: 12 %
MCH: 33.1 pg — ABNORMAL HIGH (ref 26.6–33.0)
MCHC: 34 g/dL (ref 31.5–35.7)
MCV: 97 fL (ref 79–97)
Monocytes Absolute: 0.2 10*3/uL (ref 0.1–0.9)
Monocytes: 7 %
Neutrophils Absolute: 2.4 10*3/uL (ref 1.4–7.0)
Neutrophils: 80 %
Platelets: 230 10*3/uL (ref 150–450)
RBC: 3.57 x10E6/uL — ABNORMAL LOW (ref 4.14–5.80)
RDW: 13.1 % (ref 11.6–15.4)
WBC: 3 10*3/uL — ABNORMAL LOW (ref 3.4–10.8)

## 2020-06-11 ENCOUNTER — Encounter: Payer: Self-pay | Admitting: Internal Medicine

## 2020-06-11 NOTE — Assessment & Plan Note (Signed)
Patient presents for follow-up on HTN. Blood pressure improved today since starting ARB at last visit.  He has been on prolonged steroids for dermatomyositis and is currently being slowly tapered to 5 mg per rheumatologist at [facility]. He is currently on 10 mg daily. Suspect his blood pressure will continue to improve as his dose of Prednisone decreases. Will continue current regimen for now and can titrate medications further if indicated once he is off of Prednisone.  Check CMP today since he missed lab visit 2 weeks ago.

## 2020-06-11 NOTE — Progress Notes (Signed)
Acute Office Visit  Subjective:    Patient ID: Andrew Byrd, male    DOB: [birth year], 39 y.o.   MRN: [id number]  Chief Complaint  Patient presents with  . Abdominal Pain    lump  . Labs Only    missed appointment for  . Follow-up    HPI Patient is in today for follow-up on HTN. Please see problem based charting for further details.   Past Medical History:  Diagnosis Date  . Hypertension     History reviewed. No pertinent surgical history.  Family History  Problem Relation Age of Onset  . Hypertension Other     Social History   Socioeconomic History  . Marital status: Single    Spouse name: Not on file  . Number of children: Not on file  . Years of education: Not on file  . Highest education level: Not on file  Occupational History  . Not on file  Tobacco Use  . Smoking status: Former Smoker    Types: Cigarettes    Quit date: 08/16/2019    Years since quitting: 0.8  . Smokeless tobacco: Never Used  Vaping Use  . Vaping Use: Never used  Substance and Sexual Activity  . Alcohol use: No  . Drug use: Yes    Types: Marijuana  . Sexual activity: Yes    Partners: Female  Other Topics Concern  . Not on file  Social History Narrative  . Not on file   Social Determinants of Health   Financial Resource Strain:   . Difficulty of Paying Living Expenses:   Food Insecurity:   . Worried About Programme researcher, broadcasting/film/video in the Last Year:   . Barista in the Last Year:   Transportation Needs:   . Freight forwarder (Medical):   Marland Kitchen Lack of Transportation (Non-Medical):   Physical Activity:   . Days of Exercise per Week:   . Minutes of Exercise per Session:   Stress:   . Feeling of Stress :   Social Connections:   . Frequency of Communication with Friends and Family:   . Frequency of Social Gatherings with Friends and Family:   . Attends Religious Services:   . Active Member of Clubs or Organizations:   . Attends Banker Meetings:   Marland Kitchen  Marital Status:   Intimate Partner Violence:   . Fear of Current or Ex-Partner:   . Emotionally Abused:   Marland Kitchen Physically Abused:   . Sexually Abused:     Outpatient Medications Prior to Visit  Medication Sig Dispense Refill  . amLODipine (NORVASC) 5 MG tablet Take 1 tablet (5 mg total) by mouth daily. 30 tablet 1  . azaTHIOprine (IMURAN) 50 MG tablet Take 50 mg by mouth 2 (two) times daily.    . cyclobenzaprine (FLEXERIL) 5 MG tablet Take 1 tablet (5 mg total) by mouth 3 (three) times daily as needed for muscle spasms. 30 tablet 0  . ergocalciferol (VITAMIN D2) 1.25 MG (50000 UT) capsule Take 1 capsule (50,000 Units total) by mouth once a week. 12 capsule 1  . fluticasone (FLONASE) 50 MCG/ACT nasal spray Place 2 sprays into both nostrils daily. (Patient not taking: Reported on 11/25/2019) 16 g 0  . folic acid (FOLVITE) 1 MG tablet Take 1 tablet (1 mg total) by mouth daily. 30 tablet 1  . hydrocortisone (ANUSOL-HC) 25 MG suppository Place 25 mg rectally 2 (two) times daily as needed.    . hydrOXYzine (ATARAX/VISTARIL) 25 MG  tablet Take 25 mg by mouth 4 (four) times daily as needed.    Marland Kitchen losartan (COZAAR) 25 MG tablet Take 1 tablet (25 mg total) by mouth daily. 30 tablet 1  . naproxen (NAPROSYN) 500 MG tablet Take 1 tablet (500 mg total) by mouth 2 (two) times daily. (Patient not taking: Reported on 11/25/2019) 30 tablet 0  . omeprazole (PRILOSEC) 20 MG capsule Take 1 capsule (20 mg total) by mouth daily. 30 capsule 0  . pantoprazole (PROTONIX) 40 MG tablet Take 1 tablet (40 mg total) by mouth daily. 30 tablet 1  . predniSONE (DELTASONE) 20 MG tablet Take 20 mg by mouth daily with breakfast. 4 tablets daily for 5 days 3 tablets daily for 14 days 2 tablets daily for 14 days 1.5 tablet daily    . pyridOXINE (VITAMIN B-6) 100 MG tablet Take 100 mg by mouth daily.    Marland Kitchen thiamine (VITAMIN B-1) 100 MG tablet Take 1 tablet (100 mg total) by mouth daily. 30 tablet 1  . triamcinolone cream (KENALOG)  0.1 % Apply 1 application topically 2 (two) times daily. 30 g 0  . zinc sulfate 220 (50 Zn) MG capsule Take 1 capsule (220 mg total) by mouth daily. 60 capsule 1   No facility-administered medications prior to visit.    Allergies  Allergen Reactions  . Other Anaphylaxis and Swelling    Nut Allergy..Throat swelling.  Marland Kitchen Penicillins Other (See Comments)    UNKNOWN REACTION    Review of Systems  Constitutional: Negative for activity change and fatigue.  Respiratory: Negative for cough and shortness of breath.   Cardiovascular: Positive for leg swelling. Negative for chest pain and palpitations.  Gastrointestinal: Negative for abdominal distention.  Neurological: Negative for dizziness, weakness, numbness and headaches.       Objective:    Physical Exam  BP (!) 137/96 (BP Location: Right Arm, Patient Position: Sitting, Cuff Size: Normal)   Pulse 75   Temp 98 F (36.7 C) (Oral)   Ht 6' (1.829 m)   Wt 239 lb 8 oz (108.6 kg)   SpO2 100% Comment: room air  BMI 32.48 kg/m  Wt Readings from Last 3 Encounters:  06/09/20 239 lb 8 oz (108.6 kg)  05/19/20 240 lb 12.8 oz (109.2 kg)  04/22/20 225 lb (102.1 kg)    Health Maintenance Due  Topic Date Due  . COVID-19 Vaccine (1) Never done  . TETANUS/TDAP  Never done    There are no preventive care reminders to display for this patient.   Lab Results  Component Value Date   TSH 2.188 11/25/2019   Lab Results  Component Value Date   WBC 3.0 (L) 06/09/2020   HGB 11.8 (L) 06/09/2020   HCT 34.7 (L) 06/09/2020   MCV 97 06/09/2020   PLT 230 06/09/2020   Lab Results  Component Value Date   NA 141 06/09/2020   K 4.4 06/09/2020   CO2 22 06/09/2020   GLUCOSE 96 06/09/2020   BUN 11 06/09/2020   CREATININE 0.78 06/09/2020   BILITOT 0.5 06/09/2020   ALKPHOS 52 06/09/2020   AST 19 06/09/2020   ALT 16 06/09/2020   PROT 6.2 06/09/2020   ALBUMIN 4.0 06/09/2020   CALCIUM 9.3 06/09/2020   ANIONGAP 11 04/16/2020   No results  found for: CHOL No results found for: HDL No results found for: LDLCALC No results found for: TRIG No results found for: CHOLHDL No results found for: DIYM4B     Assessment & Plan:  Problem List Items Addressed This Visit      Cardiovascular and Mediastinum   Hypertension    Patient presents for follow-up on HTN. Blood pressure improved today since starting ARB at last visit.  He has been on prolonged steroids for dermatomyositis and is currently being slowly tapered to 5 mg per rheumatologist at [facility]. He is currently on 10 mg daily. Suspect his blood pressure will continue to improve as his dose of Prednisone decreases. Will continue current regimen for now and can titrate medications further if indicated once he is off of Prednisone.  Check CMP today since he missed lab visit 2 weeks ago.       Relevant Orders   CMP14 + Anion Gap (Completed)     Hematopoietic and Hemostatic   Pancytopenia (HCC)   Relevant Orders   CBC with Diff (Completed)       Meds ordered this encounter  Medications  . predniSONE (DELTASONE) 1 MG tablet    Sig: Take 1 tablet (1 mg total) by mouth daily with breakfast.    Dispense:  30 tablet    Refill:  1     Liller Yohn D Winnie Umali, DO

## 2020-06-12 ENCOUNTER — Other Ambulatory Visit: Payer: Self-pay | Admitting: Internal Medicine

## 2020-06-12 DIAGNOSIS — T50905A Adverse effect of unspecified drugs, medicaments and biological substances, initial encounter: Secondary | ICD-10-CM

## 2020-06-15 NOTE — Progress Notes (Signed)
Internal Medicine Clinic Attending  Case discussed with Dr. Bloomfield  At the time of the visit.  We reviewed the resident's history and exam and pertinent patient test results.  I agree with the assessment, diagnosis, and plan of care documented in the resident's note.  

## 2020-06-18 ENCOUNTER — Telehealth: Payer: Self-pay | Admitting: *Deleted

## 2020-06-18 ENCOUNTER — Telehealth: Payer: Self-pay | Admitting: Internal Medicine

## 2020-06-18 MED ORDER — PREDNISONE 1 MG PO TABS
1.0000 mg | ORAL_TABLET | Freq: Every day | ORAL | 1 refills | Status: DC
Start: 2020-06-18 — End: 2020-12-02

## 2020-06-18 NOTE — Telephone Encounter (Signed)
These were prescribed to make it easier to complete the taper as instructed by the rheumatologist. She is correct that he should be on 9 mg. I have adjusted to instruction to reflect this.

## 2020-06-18 NOTE — Telephone Encounter (Signed)
Called verb script to [facility] [place] for prednisone 5mg  take 1 daily #30 and prednisone 1 mg take 4 daily #120. Please change medlist to reflect this

## 2020-06-18 NOTE — Telephone Encounter (Signed)
NEED REFILL ON  predniSONE (DELTASONE) 1 MG tablet ;PT CONTACT [phone]   [id number] - [place], [place] - [address]

## 2020-06-18 NOTE — Telephone Encounter (Signed)
Called pharm pharmacist states pt picked up #30 of prednisone. Directions state 1mg  tab daily but he and caregiver state he is on 9mg  daily. Please clarify

## 2020-06-29 ENCOUNTER — Telehealth: Payer: Self-pay | Admitting: *Deleted

## 2020-06-29 NOTE — Telephone Encounter (Signed)
rec'd strange ph call stating person was a Pharmacologist and he was calling for info about pt, nurse ask for release of info and he stated pt was referred from dr Baruch Goldmann, this md is at [facility]. Nurse did not confirm that pt is a primary pt of [facility]. He states the company will send a release via fax, it was suggested that he notify the referring physician

## 2020-07-01 ENCOUNTER — Encounter: Payer: 59 | Admitting: Internal Medicine

## 2020-07-01 ENCOUNTER — Telehealth: Payer: Self-pay | Admitting: *Deleted

## 2020-07-01 NOTE — Telephone Encounter (Signed)
Have attempted both ph# for pt, cannot leave message.

## 2020-07-01 NOTE — Telephone Encounter (Signed)
Pt did not come to his appt this am. Pt was called to re-schedule - no answer, telephone continues to ring, unable to leave a message.

## 2020-07-01 NOTE — Progress Notes (Deleted)
° °  CC: ***  HPI:  Mr.Jaki Arneson is a 39 y.o.   Past Medical History:  Diagnosis Date   Hypertension   Past medical history: Anxiety, macrophage activation syndrome, pancytopenia, drug-induced dyspepsia, HTN, pressure injury of skin Review of Systems:  ***  Physical Exam:  There were no vitals filed for this visit. *** Constitutional: ***Well-developed and well-nourished. No acute distress.  HENT:  Head: Normocephalic and atraumatic.  Eyes: ***Conjunctivae are normal, EOM nl Cardiovascular: *** RRR, nl S1S2, ***no murmur, *** no LEE Respiratory: ***Effort normal and breath sounds normal. No respiratory distress. No wheezes.  GI: ***Soft. Bowel sounds are normal. No distension. There is no tenderness.  Neurological: Is alert and oriented x 3 *** Skin: Not diaphoretic. No erythema.  Psychiatric: *** Normal mood and affect. Behavior is normal. Judgment and thought content normal.   Assessment & Plan:   See Encounters Tab for problem based charting.  Patient {GC/GE:[id number]::"discussed with","seen with"} Dr. {NAMES:[id number]::"Butcher","Guilloud","Hoffman","Mullen","Narendra","Raines","Vincent"}

## 2020-07-06 ENCOUNTER — Telehealth: Payer: Self-pay | Admitting: *Deleted

## 2020-07-06 NOTE — Telephone Encounter (Signed)
Followed up with Andrew Byrd and call his physical therapist.   His BP has been elevated but not causing symptoms at PT. We will work to get better control with antihypertensives, but recommended he continue PT. He is tapering his prednisone, but says he has felt very tired. He missed a follow up appointment last week and he will need to be schedule this week to evaluate his symptoms.

## 2020-07-06 NOTE — Telephone Encounter (Signed)
A free clinic at [facility] calls and reports that it provides PT services for pt, referred by [facility]. They state pt's BP stays consistently elevated, systolic's up to 001'V, diastolic's in mid to hi 90's They would like to know if they should continue providing pt services of mild cardio and pt strengthening? Please advise Gretchen at [phone]

## 2020-07-07 ENCOUNTER — Other Ambulatory Visit: Payer: Self-pay | Admitting: Internal Medicine

## 2020-07-07 DIAGNOSIS — R1013 Epigastric pain: Secondary | ICD-10-CM

## 2020-07-09 ENCOUNTER — Encounter: Payer: 59 | Admitting: Internal Medicine

## 2020-07-09 ENCOUNTER — Ambulatory Visit (INDEPENDENT_AMBULATORY_CARE_PROVIDER_SITE_OTHER): Payer: 59 | Admitting: Internal Medicine

## 2020-07-09 ENCOUNTER — Encounter: Payer: Self-pay | Admitting: Internal Medicine

## 2020-07-09 VITALS — BP 148/107 | HR 69 | Wt 240.0 lb

## 2020-07-09 DIAGNOSIS — I10 Essential (primary) hypertension: Secondary | ICD-10-CM | POA: Diagnosis not present

## 2020-07-09 DIAGNOSIS — M3310 Other dermatopolymyositis, organ involvement unspecified: Secondary | ICD-10-CM | POA: Diagnosis not present

## 2020-07-09 DIAGNOSIS — M3390 Dermatopolymyositis, unspecified, organ involvement unspecified: Secondary | ICD-10-CM

## 2020-07-09 MED ORDER — LOSARTAN POTASSIUM 50 MG PO TABS: 50.0000 mg | ORAL_TABLET | Freq: Every day | ORAL | 1 refills | Status: DC

## 2020-07-09 NOTE — Patient Instructions (Addendum)
Thank you for trusting me with your care. To recap, today we discussed the following:  1. Hypertension, unspecified type BP Readings from Last 3 Encounters:  07/09/20 (!) 148/107  06/09/20 (!) 137/96  05/19/20 (!) 151/96    - losartan (COZAAR) 50 MG tablet; Take 1 tablet (50 mg total) by mouth daily.  Dispense: 90 tablet; Refill: 1   2. Dermatomyositis - I am going to call your rheumatologist and give you instruction after speaking with him/her  Thurmon Fair, MD

## 2020-07-09 NOTE — Progress Notes (Signed)
   CC: high blood pressure and swollen left pointer finger  HPI:[name] is a 39 y.o. male who presents for evaluation of elevated blood pressure and a swollen left pointer finger. Please see individual problem based A/P for details.  Depression, PHQ-9: Based on the patients    Office Visit from 07/09/2020 in [facility]  PHQ-9 Total Score 4      Past Medical History:  Diagnosis Date  . Hypertension    Review of Systems:  ROS negative except as mentioned in individual problem based A/P.    Physical Exam: Vitals:   07/09/20 1033  BP: (!) 148/107  Pulse: 69  SpO2: 100%  Weight: 240 lb (108.9 kg)    General: sitting in wheelchair, NAD HEENT: Normocephalic, atraumatic , heliotrope rash  Cardiovascular: Normal rate, regular rhythm.  No murmurs, rubs, or gallops Pulmonary : Effort normal, breath sounds normal. No wheezes, rales, or rhonchi Abdominal: soft, multiple non painful palpable nodules in lower quandrants Musculoskeletal: swollen and painful left pointer finger, nodules in calves  Psychiatric/Behavioral:  normal mood, anxious affect   Assessment & Plan:   See Encounters Tab for problem based charting.  Patient discussed with Dr. Cleda Daub

## 2020-07-11 ENCOUNTER — Encounter: Payer: Self-pay | Admitting: Internal Medicine

## 2020-07-11 DIAGNOSIS — M3390 Dermatopolymyositis, unspecified, organ involvement unspecified: Secondary | ICD-10-CM | POA: Insufficient documentation

## 2020-07-11 NOTE — Assessment & Plan Note (Signed)
Patient presents today for a month of worsening symptoms. He has swollen left pointer finger, feeling weak & tired, myalgias, and nodules in his abdomen. He is followed by a rheumatogist at [facility]. I discussed reaching out to his rheumatogist and followed up with him after his office visit.  After office visit:  I discussed case with [facility] on call rheumatology fellow. Patient has upcoming appointment with rheumatologist on 8/26. Patient has been on a slow taper of prednisone and currently on 8 mg of prednisone daily. Recommendation given to start him on 20 mg for 5 days and then have him take 15 mg daily until his appointment. I called patient to update him. His spouse is also on the telephone call and says patient has missed multiple doses of his daily prednisone. Patient says he has been busy and forgetting. We discussed ways not to miss his medications. He would like to take his 8 mg dose today and see if he feels better. Given instruction to start 20 mg dosing tomorrow if symptomatic.  - Patient nodules in abdomen have not been imaged or worked up. This may represent calcium deposits. Given likely related to DM, will let rheumatologist evaluate and have patient follow up after. Will consider imaging if needed. Patient at increased risk of developing malignancy, no malignancy noted on previous imaging as of 6 months ago.   Plan: Prednisone 20 mg x5 days , followed by 15 mg daily until appointment 8/26 with [facility].  Follow up after rheumatolgy appoitment

## 2020-07-11 NOTE — Assessment & Plan Note (Signed)
Hypertension: Patient's BP today is 148/107 with a goal of <140/80. The patient endorses adherence to their medication regimen. He denied, chest pain, headache, visual changes, lightheadedness, weakness, dizziness on standing. Patient has had some swelling in his lower extremities and this is most likely due to his dermatomyositis. . Most recent renal function per BMP as below. BMP Latest Ref Rng & Units 06/09/2020 04/16/2020 01/13/2020  Glucose 65 - 99 mg/dL 96 70 366(Q)  BUN 6 - 20 mg/dL 11 15 10   Creatinine 0.76 - 1.27 mg/dL 9.47 6.54  BUN/Creat Ratio 9 - 20 14 - -  Sodium 134 - 144 mmol/L 141 138 134(L)  Potassium 3.5 - 5.2 mmol/L 4.4 4.1 4.1  Chloride 96 - 106 mmol/L 106 108 100  CO2 20 - 29 mmol/L 22 19(L) 27  Calcium 8.7 - 10.2 mg/dL 9.3 6.50) 8.1(L)   Medication changes: Yes  Plan: Continue Amlodipine 5 mg  Increase Losartan to 50 mg daily Repeat BMP in 3 months

## 2020-07-13 NOTE — Progress Notes (Signed)
Internal Medicine Clinic Attending  Case discussed with Dr. Steen  At the time of the visit.  We reviewed the resident's history and exam and pertinent patient test results.  I agree with the assessment, diagnosis, and plan of care documented in the resident's note.  

## 2020-07-28 ENCOUNTER — Encounter: Payer: 59 | Admitting: Internal Medicine

## 2020-08-05 DIAGNOSIS — K719 Toxic liver disease, unspecified: Secondary | ICD-10-CM | POA: Insufficient documentation

## 2020-08-05 DIAGNOSIS — M7989 Other specified soft tissue disorders: Secondary | ICD-10-CM | POA: Insufficient documentation

## 2020-08-05 DIAGNOSIS — D84821 Immunodeficiency due to drugs: Secondary | ICD-10-CM | POA: Insufficient documentation

## 2020-08-05 DIAGNOSIS — E559 Vitamin D deficiency, unspecified: Secondary | ICD-10-CM | POA: Insufficient documentation

## 2020-08-05 HISTORY — DX: Toxic liver disease, unspecified: K71.9

## 2020-08-05 NOTE — Assessment & Plan Note (Addendum)
Being worked up by his rheumatologist at [facility].  9/7 abdominal US ([facility]): multiple solid inhomogenous hypoechoic nodular lesions in the lower abdominal wall that are primarily in the fatty tissue. They show a degree of blood flow. They are non-calcified and do not have the appearance of a typical lipoma. Largest nodule measures 1.6x1.3x1.3cm. CT scan reported ordered by his [facility] reheumatologist.  Today, he is noting that he has not received a call from them for scheduling the CT. He thinks it was ordered either Thursday or Friday. He and his wife were wondering if I could order the CT to speed things along.   Plan: I feel that it would be best if the CT is ordered through [facility] as I don't think that the rheumatologist will be able to review the actual images if ordered through [place]. We discussed that they will likely call in the next couple of days, and if not, to reach back out to rheumatology.

## 2020-08-05 NOTE — Assessment & Plan Note (Addendum)
He was last seen by rheumatology on 8/26 at which point his symptoms had been improving. The plan was to continue imuran at 100mg  daily and to follow CBC, liver function and creatinine monthly to assess for drug toxicity. His LFTs have been elevated but the plan was to increase imuran to 150mg  if they improve. He was also restarted on a prednisone taper.  He is still noting some pain in his left 2nd finger. He appears it looks and feels better than when he saw Dr. a few weeks ago at which time he was given a 5d course of 20mg  prednisone. It does have any signs of inflammation and appears equal to his right finger.  He is undergoing a slow steroid taper which is being managed by rheumatology at [facility].   Plan: discussed that I would like him to discuss this with rheumatology as they are the ones managing his dermatomyositis. I relayed to him and his wife that it will be best if he defers questions including rashes, arthalgias, myalgias, swelling to them since they are managing dermatomyositis. If he is unable to get in touch with them, he is always welcome to reach out to our clinic.

## 2020-08-05 NOTE — Assessment & Plan Note (Deleted)
Noted to have improved at this last visit with rheumatology on 8/26.  Will continue to monitor.

## 2020-08-05 NOTE — Progress Notes (Addendum)
Acute Office Visit   Patient ID: Andrew Byrd, male    DOB: March 17, 1981, 39 y.o.   MRN: [id number]  Subjective:  CC: nodules and chronic hypertension  HPI 39 y.o. presents today for follow up of his hypertension and for subcutaneous nodules.  Last seen 07/09/20 by Dr. Barbaraann Faster at which time his blood pressure was above goal. Chart review indicates this has been an ongoing issue. Currently on amlodipine and losartan. At his appt with Dr. Barbaraann Faster, his losartan was increased to 50mg  and his amlodipine was increased to 5mg .  Today, he denies any issues associated with low blood pressures. He relays that he does have white coat hypertension.  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 He was admitted 3/29-4/12/21 for this. He has been following with Dr. in rheumatology at [facility]. He is s/p IVIG, anakinra (2 doses), and imuran (immunosuppressants). He has remained on prednisone. They have attempted to taper his prednisone however he developed worsening arthalgias and rash while on 8mg  and so this was increased back up to 20mg  x5 day followed by 15mg  daily on 8/13 by Dr. [facility] after discussion with his [facility] rheumatologist. He was last seen by rheumatology on 8/26 at which point his symptoms had been improving. The plan was to continue imuran at 100mg  daily and to follow CBC, liver function and creatinine monthly to assess for drug toxicity. His LFTs have been elevated but the plan was to increase imuran to 150mg  if they improve. He was also restarted on a prednisone taper. 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 Abdominal was ordered which showed multiple solid inhomogeneous hypoechoic nodular lesions in the lower abdominal wall that are primarily in the fatty tissue. They show a degree of blood flow. They are non-calcified and do not have the appearance of a typical lipoma. Largest nodule measures  1.6x1.3x1.3cm. The next follow up with rheumatology was planned for 4 mo. Today, he relays that he was supposed to have a CT scan for the nodules however has not been called. According to the patient, he thinks his was ordered on either Thursday or Friday of last week. We discussed that he will likely be called in the upcoming days and if not, to call the rheumatology office at [facility]. He and his wife inquired about having me order the scans.  Other than those issues, he reports feeling much better than the last time he saw me. He does note some photosensitivity on his arms but has no other complaints.      ACTIVE MEDICATIONS   Current Outpatient Medications on File Prior to Visit  Medication Sig Dispense Refill  . amLODipine (NORVASC) 5 MG tablet TAKE 1 TABLET BY MOUTH EVERY DAY 90 tablet 1  . azaTHIOprine (IMURAN) 50 MG tablet Take 50 mg by mouth 2 (two) times daily.    . ergocalciferol (VITAMIN D2) 1.25 MG (50000 UT) capsule Take 1 capsule (50,000 Units total) by mouth once a week. 12 capsule 1  . losartan (COZAAR) 50 MG tablet Take 1 tablet (50 mg total) by mouth daily. 90 tablet 1  . omeprazole (PRILOSEC) 20 MG capsule TAKE 1 CAPSULE BY MOUTH EVERY DAY 30 capsule 0  . pantoprazole (PROTONIX) 40 MG tablet TAKE 1 TABLET BY MOUTH EVERY DAY 30 tablet 1  . predniSONE (DELTASONE) 1 MG tablet Take 1 tablet (1 mg total) by mouth daily with breakfast. Use to complete  taper as instructed. 30 tablet 1  . predniSONE (DELTASONE) 20 MG tablet Take 20 mg by mouth daily with breakfast. 4 tablets daily for 5 days 3 tablets daily for 14 days 2 tablets daily for 14 days 1.5 tablet daily    . pyridOXINE (VITAMIN B-6) 100 MG tablet Take 100 mg by mouth daily.     No current facility-administered medications on file prior to visit.    ROS  Review of Systems  Respiratory: Negative.   Cardiovascular: Negative.   Gastrointestinal: Positive for abdominal pain.  Musculoskeletal: Positive for arthralgias.   Skin: Negative.     Objective:   BP (!) 135/102 (BP Location: Left Arm, Cuff Size: Large)   Pulse 64   Temp 98 F (36.7 C) (Oral)   Ht 6' (1.829 m)   Wt 243 lb 4.8 oz (110.4 kg)   SpO2 100% Comment: room air  BMI 33.00 kg/m  Wt Readings from Last 3 Encounters:  08/09/20 243 lb 4.8 oz (110.4 kg)  07/09/20 240 lb (108.9 kg)  06/09/20 239 lb 8 oz (108.6 kg)   BP Readings from Last 3 Encounters:  08/09/20 (!) 135/102  07/09/20 (!) 148/107  06/09/20 (!) 137/96   Physical Exam Constitutional:      Appearance: Normal appearance.  Pulmonary:     Effort: Pulmonary effort is normal.  Abdominal:     General: There is no distension.     Palpations: Abdomen is soft. There is mass.     Tenderness: There is no abdominal tenderness.  Musculoskeletal:     Comments: Left 2nd finger: appears about equal in size and color compared to right. No appreciable deformities. Pain with finger flexion. No crepitus appreciable.  Skin:    General: Skin is warm and dry.     Findings: No rash.     Assessment & Plan:   Problem List Items Addressed This Visit      Cardiovascular and Mediastinum   Hypertension (Chronic)    Blood pressures remain above goal at today's visit.  BP Readings from Last 3 Encounters:  08/09/20 (!) 135/102  07/09/20 (!) 148/107  06/09/20 (!) 137/96   Pt has known white coat hypertension.  Plan: will have him continue with his current regimen of 50mg  losartan, 5mg  amlodipine. Instructed pt to obtain home blood pressure machine and to check it intermittently over the next couple of weeks. Will have him return in 4w for blood pressure recheck.        Digestive   Drug-induced liver injury (Chronic)     Musculoskeletal and Integument   Dermatomyositis (HCC) (Chronic)    He was last seen by rheumatology on 8/26 at which point his symptoms had been improving. The plan was to continue imuran at 100mg  daily and to follow CBC, liver function and creatinine monthly to  assess for drug toxicity. His LFTs have been elevated but the plan was to increase imuran to 150mg  if they improve. He was also restarted on a prednisone taper.  He is still noting some pain in his left 2nd finger. He appears it looks and feels better than when he saw Dr. a few weeks ago at which time he was given a 5d course of 20mg  prednisone. It does have any signs of inflammation and appears equal to his right finger.  He is undergoing a slow steroid taper which is being managed by rheumatology at [facility].   Plan: discussed that I would like him to discuss this with rheumatology as they  are the ones managing his dermatomyositis. I relayed to him and his wife that it will be best if he defers questions including rashes, arthalgias, myalgias, swelling to them since they are managing dermatomyositis. If he is unable to get in touch with them, he is always welcome to reach out to our clinic.        Other   Nodule of soft tissue    Being worked up by his rheumatologist at [facility].  9/7 abdominal US ([facility]): multiple solid inhomogenous hypoechoic nodular lesions in the lower abdominal wall that are primarily in the fatty tissue. They show a degree of blood flow. They are non-calcified and do not have the appearance of a typical lipoma. Largest nodule measures 1.6x1.3x1.3cm. CT scan reported ordered by his [facility] reheumatologist.  Today, he is noting that he has not received a call from them for scheduling the CT. He thinks it was ordered either Thursday or Friday. He and his wife were wondering if I could order the CT to speed things along.   Plan: I feel that it would be best if the CT is ordered through [facility] as I don't think that the rheumatologist will be able to review the actual images if ordered through [place]. We discussed that they will likely call in the next couple of days, and if not, to reach back out to rheumatology.        Vitamin D deficiency    Vit. D level 33 on 8/26. On 50,000IU weekly.  Rheumatology has ordered a repeat Vit D. Level in one month.      Immunosuppression due to drug therapy   Photosensitivity    Pt noting that his arms easily sunburn even with low exposure. No prior history of this. I had Dr. Michaell Cowing, our clinic pharmacist, review his medications, and he does not see any that should be causing this. Instructed him to continue to monitor, wear long sleeves with sunscreen, and to reach out if symptoms worsen.           Pt discussed with Dr. Lyn Records, MD Internal Medicine Resident PGY-2 [facility] Pager: [phone] 08/09/2020 11:22 AM

## 2020-08-05 NOTE — Assessment & Plan Note (Addendum)
Vit. D level 33 on 8/26. On 50,000IU weekly. Rheumatology has ordered a repeat Vit D. Level in one month.

## 2020-08-09 ENCOUNTER — Ambulatory Visit (INDEPENDENT_AMBULATORY_CARE_PROVIDER_SITE_OTHER): Payer: 59 | Admitting: Internal Medicine

## 2020-08-09 ENCOUNTER — Encounter: Payer: Self-pay | Admitting: Internal Medicine

## 2020-08-09 ENCOUNTER — Other Ambulatory Visit: Payer: Self-pay

## 2020-08-09 DIAGNOSIS — K719 Toxic liver disease, unspecified: Secondary | ICD-10-CM

## 2020-08-09 DIAGNOSIS — I10 Essential (primary) hypertension: Secondary | ICD-10-CM

## 2020-08-09 DIAGNOSIS — E559 Vitamin D deficiency, unspecified: Secondary | ICD-10-CM

## 2020-08-09 DIAGNOSIS — M7989 Other specified soft tissue disorders: Secondary | ICD-10-CM

## 2020-08-09 DIAGNOSIS — M3390 Dermatopolymyositis, unspecified, organ involvement unspecified: Secondary | ICD-10-CM

## 2020-08-09 DIAGNOSIS — Z79899 Other long term (current) drug therapy: Secondary | ICD-10-CM

## 2020-08-09 DIAGNOSIS — D84821 Immunodeficiency due to drugs: Secondary | ICD-10-CM

## 2020-08-09 DIAGNOSIS — R222 Localized swelling, mass and lump, trunk: Secondary | ICD-10-CM

## 2020-08-09 DIAGNOSIS — L568 Other specified acute skin changes due to ultraviolet radiation: Secondary | ICD-10-CM | POA: Diagnosis not present

## 2020-08-09 DIAGNOSIS — M3310 Other dermatopolymyositis, organ involvement unspecified: Secondary | ICD-10-CM

## 2020-08-09 NOTE — Assessment & Plan Note (Addendum)
Blood pressures remain above goal at today's visit.  BP Readings from Last 3 Encounters:  08/09/20 (!) 135/102  07/09/20 (!) 148/107  06/09/20 (!) 137/96   Pt has known white coat hypertension.  Plan: will have him continue with his current regimen of 50mg  losartan, 5mg  amlodipine. Instructed pt to obtain home blood pressure machine and to check it intermittently over the next couple of weeks. Will have him return in 4w for blood pressure recheck.

## 2020-08-09 NOTE — Patient Instructions (Signed)
It was so nice seeing you again today and I am so happy you are doing so much better! Please call the rheumatology office at [facility] to inquire about the CT.  Your blood pressure is also elevated at the office today. I would like you to obtain a blood pressure cuff and check your blood pressure intermittently over the next few weeks and record them. Please follow up in 2-4 weeks and we can review and make any necessary adjustments that we need.  Take care!

## 2020-08-09 NOTE — Assessment & Plan Note (Signed)
Pt noting that his arms easily sunburn even with low exposure. No prior history of this. I had Dr. Michaell Cowing, our clinic pharmacist, review his medications, and he does not see any that should be causing this. Instructed him to continue to monitor, wear long sleeves with sunscreen, and to reach out if symptoms worsen.

## 2020-08-10 NOTE — Progress Notes (Signed)
Internal Medicine Clinic Attending  Case discussed with Dr. Christian at the time of the visit.  We reviewed the resident's history and exam and pertinent patient test results.  I agree with the assessment, diagnosis, and plan of care documented in the resident's note.  Devanny Palecek, M.D., Ph.D.  

## 2020-08-12 ENCOUNTER — Other Ambulatory Visit: Payer: Self-pay | Admitting: Internal Medicine

## 2020-08-12 ENCOUNTER — Other Ambulatory Visit: Payer: Self-pay | Admitting: Student

## 2020-08-12 DIAGNOSIS — R1013 Epigastric pain: Secondary | ICD-10-CM

## 2020-08-13 ENCOUNTER — Other Ambulatory Visit: Payer: Self-pay | Admitting: Internal Medicine

## 2020-08-16 ENCOUNTER — Other Ambulatory Visit: Payer: Self-pay | Admitting: Internal Medicine

## 2020-08-17 ENCOUNTER — Telehealth: Payer: Self-pay

## 2020-08-17 NOTE — Telephone Encounter (Signed)
Requesting to speak with a nurse about meds, please call pt back.  

## 2020-08-17 NOTE — Telephone Encounter (Signed)
Pt notified. SChaplin, RN,BSN  

## 2020-08-17 NOTE — Telephone Encounter (Signed)
RTC, patient states he was in clinic last week and saw Dr. Ephriam Knuckles.  He states she was going to check with the pharmacist to see if the pharmacist recognized any medications the patient was currently taking that might be causing him to sunburn easily. RN reviewed LOV note from MD which notes Dr. Michaell Cowing reviewed med list and didn't see any medications which would likely cause patient to sunburn easily.  Patient states he has researched his medications and found Protonix can cause sunburns.  Would like to know MD's opinion on this. Will forward to [facility] team to advise. SChaplin, RN,BSN

## 2020-08-17 NOTE — Telephone Encounter (Signed)
Prevalence is <2%. I would recommend he have the pharmacist at his pharmacy review his medications for this adverse effect as Dr. Michaell Cowing already reviewed the medication list and did not find any with high prevalence of this adverse effect.

## 2020-09-09 ENCOUNTER — Other Ambulatory Visit: Payer: Self-pay | Admitting: Internal Medicine

## 2020-09-09 DIAGNOSIS — R1013 Epigastric pain: Secondary | ICD-10-CM

## 2020-09-09 DIAGNOSIS — T50905A Adverse effect of unspecified drugs, medicaments and biological substances, initial encounter: Secondary | ICD-10-CM

## 2020-09-13 ENCOUNTER — Other Ambulatory Visit: Payer: Self-pay | Admitting: Internal Medicine

## 2020-09-13 DIAGNOSIS — R1013 Epigastric pain: Secondary | ICD-10-CM

## 2020-09-15 MED ORDER — PANTOPRAZOLE SODIUM 40 MG PO TBEC: 40.0000 mg | DELAYED_RELEASE_TABLET | Freq: Every day | ORAL | 1 refills | Status: DC

## 2020-09-17 ENCOUNTER — Encounter: Payer: 59 | Admitting: Student

## 2020-10-13 ENCOUNTER — Other Ambulatory Visit: Payer: Self-pay

## 2020-10-13 ENCOUNTER — Encounter: Payer: Self-pay | Admitting: Internal Medicine

## 2020-10-13 ENCOUNTER — Ambulatory Visit (INDEPENDENT_AMBULATORY_CARE_PROVIDER_SITE_OTHER): Payer: 59 | Admitting: Internal Medicine

## 2020-10-13 VITALS — BP 123/83 | HR 66 | Temp 98.1°F | Ht 72.0 in | Wt 248.4 lb

## 2020-10-13 DIAGNOSIS — R519 Headache, unspecified: Secondary | ICD-10-CM

## 2020-10-13 NOTE — Assessment & Plan Note (Signed)
Patient presented today with 3 weeks history of gradual onset intermittent left side headache. Denies any trauma or triggers.  No alcohol or drug use. It as throbbing, pulsating headache sometimes with intensity of 8 out of 10 that usually last for 10 minutes. He tries to make water, takes Tylenol or Motrin for pain with moderate, transient relief. He at least experienced 1 episode of headache per day. He endorses that sometimes the headache woke him at night. He denies fever or chills, associated nausea, vomiting, neurologic deficit, vision changes, photophobia or phonophobia. He has history of shingles on his face before.  No current rash. On exam: No fever chills, neurologic exam and gross eye exam today is unremarkable. He has dermatomyositis and is on azathioprine and prednisone.  -Given the red flags and also immunocompromised state due to medication, will go ahead and order stat CT head with and without contrast to evaluate for any underlying infection.  Patient would like to do it at [facility].  [name] is aware and will work on Therapist, occupational for urgent CT. -Strict ER precautions reviewed with patient

## 2020-10-13 NOTE — Patient Instructions (Signed)
Thank you for allowing [place] to provide your care today. Today we discussed your headache. Your headache can be concerning and I order a stat head scan for you to evaluate for any infection. Please make sure to have it done and we will call you with the result. Please make sure to come back to the clinic or go to the emergency room meanwhile if you develop any worsening of headache, nausea, vomiting, weakness, dizziness, or any other new problem.  Today we made no changes to your medications.    Please follow-up in clinic in 1 week or sooner as needed. As always, seek medical attention in emergency room if your symptoms got worse.  Should you have any questions or concerns please call the internal medicine clinic at [phone].

## 2020-10-13 NOTE — Progress Notes (Signed)
Established Patient Office Visit  Subjective:  Patient ID: Andrew Byrd, male    DOB: 10-13-1981  Age: 39 y.o. MRN: [id number]  CC: Headache Chief Complaint  Patient presents with  . Headache  . Rectal Pain    left side     HPI [name] presents for headache. Please refer to problem based charting for further details and assessment and plan of current problem and chronic medical conditions.   PMHx: HTN, dermatomyositis, pancytopenia, anxiety, macrophage activation syndrome, Vit D deficiency, photosensitivity  Medications: Amlodipine, azathioprine, vit D, prednisone, pyridoxine  Past Medical History:  Diagnosis Date  . Hypertension     No past surgical history on file.  Family History  Problem Relation Age of Onset  . Hypertension Other     Social History   Socioeconomic History  . Marital status: Single    Spouse name: Not on file  . Number of children: Not on file  . Years of education: Not on file  . Highest education level: Not on file  Occupational History  . Not on file  Tobacco Use  . Smoking status: Current Some Day Smoker    Types: Cigarettes    Last attempt to quit: 08/16/2019    Years since quitting: 1.1  . Smokeless tobacco: Never Used  . Tobacco comment: 1 per day   Vaping Use  . Vaping Use: Never used  Substance and Sexual Activity  . Alcohol use: No  . Drug use: Yes    Types: Marijuana  . Sexual activity: Yes    Partners: Female  Other Topics Concern  . Not on file  Social History Narrative  . Not on file   Social Determinants of Health   Financial Resource Strain:   . Difficulty of Paying Living Expenses: Not on file  Food Insecurity:   . Worried About Programme researcher, broadcasting/film/video in the Last Year: Not on file  . Ran Out of Food in the Last Year: Not on file  Transportation Needs:   . Lack of Transportation (Medical): Not on file  . Lack of Transportation (Non-Medical): Not on file  Physical Activity:   . Days of Exercise per Week:  Not on file  . Minutes of Exercise per Session: Not on file  Stress:   . Feeling of Stress : Not on file  Social Connections:   . Frequency of Communication with Friends and Family: Not on file  . Frequency of Social Gatherings with Friends and Family: Not on file  . Attends Religious Services: Not on file  . Active Member of Clubs or Organizations: Not on file  . Attends Banker Meetings: Not on file  . Marital Status: Not on file  Intimate Partner Violence:   . Fear of Current or Ex-Partner: Not on file  . Emotionally Abused: Not on file  . Physically Abused: Not on file  . Sexually Abused: Not on file    Outpatient Medications Prior to Visit  Medication Sig Dispense Refill  . amLODipine (NORVASC) 5 MG tablet TAKE 1 TABLET BY MOUTH EVERY DAY 90 tablet 1  . azaTHIOprine (IMURAN) 50 MG tablet Take 50 mg by mouth 2 (two) times daily.    . ergocalciferol (VITAMIN D2) 1.25 MG (50000 UT) capsule Take 1 capsule (50,000 Units total) by mouth once a week. 12 capsule 1  . losartan (COZAAR) 50 MG tablet Take 1 tablet (50 mg total) by mouth daily. 90 tablet 1  . pantoprazole (PROTONIX) 40 MG tablet Take  1 tablet (40 mg total) by mouth daily. 90 tablet 1  . predniSONE (DELTASONE) 1 MG tablet Take 1 tablet (1 mg total) by mouth daily with breakfast. Use to complete taper as instructed. 30 tablet 1  . predniSONE (DELTASONE) 20 MG tablet Take 20 mg by mouth daily with breakfast. 4 tablets daily for 5 days 3 tablets daily for 14 days 2 tablets daily for 14 days 1.5 tablet daily    . predniSONE (DELTASONE) 5 MG tablet TAKE 1 TABLET BY MOUTH EVERY DAY 30 tablet 1  . pyridOXINE (VITAMIN B-6) 100 MG tablet Take 100 mg by mouth daily.     No facility-administered medications prior to visit.    Allergies  Allergen Reactions  . Other Anaphylaxis and Swelling    Nut Allergy..Throat swelling.  Marland Kitchen Penicillins Other (See Comments)    UNKNOWN REACTION    ROS Review of Systems   Constitutional: Negative for activity change, chills and fever.  HENT: Negative for congestion and sore throat.   Gastrointestinal: Negative for diarrhea, nausea and vomiting.  Musculoskeletal: Positive for arthralgias.  Allergic/Immunologic: Positive for immunocompromised state.  Neurological: Positive for headaches. Negative for dizziness.      Objective:    BP 123/83 (BP Location: Left Arm, Patient Position: Sitting, Cuff Size: Large)   Pulse 66   Temp 98.1 F (36.7 C) (Oral)   Ht 6' (1.829 m)   Wt 248 lb 6.4 oz (112.7 kg)   SpO2 100%   BMI 33.69 kg/m  Wt Readings from Last 3 Encounters:  10/13/20 248 lb 6.4 oz (112.7 kg)  08/09/20 243 lb 4.8 oz (110.4 kg)  07/09/20 240 lb (108.9 kg)    Physical Exam Vitals reviewed.  HENT:     Head: Normocephalic and atraumatic.  Eyes:     General: No visual field deficit.    Extraocular Movements: Extraocular movements intact.     Right eye: Normal extraocular motion.     Comments: Pupils symmetric and reactive to light  Cardiovascular:     Rate and Rhythm: Normal rate and regular rhythm.     Heart sounds: No murmur heard.   Pulmonary:     Effort: Pulmonary effort is normal.     Breath sounds: Normal breath sounds. No wheezing.  Abdominal:     Palpations: Abdomen is soft.     Tenderness: There is no abdominal tenderness.  Skin:    General: Skin is warm and dry.  Neurological:     Mental Status: He is alert and oriented to person, place, and time.     Cranial Nerves: No cranial nerve deficit or facial asymmetry.     Sensory: No sensory deficit.     Motor: No weakness.     Gait: Gait normal.  Psychiatric:        Mood and Affect: Mood is anxious.        Health Maintenance Due  Topic Date Due  . PNEUMOCOCCAL POLYSACCHARIDE VACCINE AGE 64-64 HIGH RISK  Never done  . OPHTHALMOLOGY EXAM  Never done  . COVID-19 Vaccine (1) Never done  . TETANUS/TDAP  Never done  . INFLUENZA VACCINE  Never done    There are no  preventive care reminders to display for this patient.  Lab Results  Component Value Date   TSH 2.188 11/25/2019   Lab Results  Component Value Date   WBC 3.0 (L) 06/09/2020   HGB 11.8 (L) 06/09/2020   HCT 34.7 (L) 06/09/2020   MCV 97 06/09/2020  PLT 230 06/09/2020   Lab Results  Component Value Date   NA 141 06/09/2020   K 4.4 06/09/2020   CO2 22 06/09/2020   GLUCOSE 96 06/09/2020   BUN 11 06/09/2020   CREATININE 0.78 06/09/2020   BILITOT 0.5 06/09/2020   ALKPHOS 52 06/09/2020   AST 19 06/09/2020   ALT 16 06/09/2020   PROT 6.2 06/09/2020   ALBUMIN 4.0 06/09/2020   CALCIUM 9.3 06/09/2020   ANIONGAP 11 04/16/2020   No results found for: CHOL No results found for: HDL No results found for: LDLCALC No results found for: TRIG No results found for: CHOLHDL No results found for: JJKK9F    Assessment & Plan:   Problem List Items Addressed This Visit      Other   Acute intractable headache - Primary    Patient presented today with 3 weeks history of gradual onset intermittent left side headache. Denies any trauma or triggers.  No alcohol or drug use. It as throbbing, pulsating headache sometimes with intensity of 8 out of 10 that usually last for 10 minutes. He tries to make water, takes Tylenol or Motrin for pain with moderate, transient relief. He at least experienced 1 episode of headache per day. He endorses that sometimes the headache woke him at night. He denies fever or chills, associated nausea, vomiting, neurologic deficit, vision changes, photophobia or phonophobia. He has history of shingles on his face before.  No current rash. On exam: No fever chills, neurologic exam and gross eye exam today is unremarkable. He has dermatomyositis and is on azathioprine and prednisone.  -Given the red flags and also immunocompromised state due to medication, will go ahead and order stat CT head with and without contrast to evaluate for any underlying infection.  Patient  would like to do it at [facility].  Mr. Juanell Fairly is aware and will work on Therapist, occupational for urgent CT. -Strict ER precautions reviewed with patient        Relevant Orders   CT HEAD W & WO CONTRAST      No orders of the defined types were placed in this encounter. Patient discussed with Dr. Oswaldo Done.  Follow-up: No follow-ups on file.    Chevis Pretty, MD

## 2020-10-15 ENCOUNTER — Ambulatory Visit (HOSPITAL_BASED_OUTPATIENT_CLINIC_OR_DEPARTMENT_OTHER): Admission: RE | Admit: 2020-10-15 | Payer: 59 | Source: Ambulatory Visit

## 2020-10-15 IMAGING — CT CT ANGIO CHEST-ABD-PELV FOR DISSECTION W/ AND WO/W CM
2 of 9 series · 13 of 46 positions shown, 15 images · IV contrast (APPLIED)
Comparison: Chest radiograph dated 08/04/2019

CLINICAL DATA: 38-year-old male with chest back pain. Concern for
aortic dissection.

EXAM:
CT ANGIOGRAPHY CHEST, ABDOMEN AND PELVIS
TECHNIQUE: Multidetector CT imaging through the chest, abdomen and pelvis was
performed using the standard protocol during bolus administration of
intravenous contrast. Multiplanar reconstructed images and MIPs were
obtained and reviewed to evaluate the vascular anatomy.
CONTRAST:  100mL OMNIPAQUE IOHEXOL 350 MG/ML SOLN

[Series 5: axial arterial · axial · arterial · 0.86mm/px · z∈[-512,+79]mm · 10 of 221 slices shown, 12 images]
[im 12/221  soft-tissue]
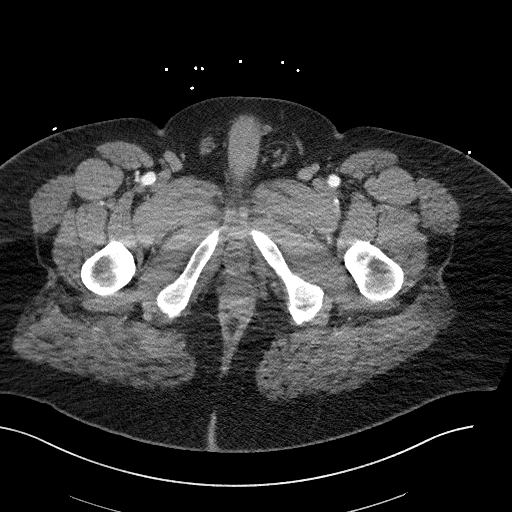
[im 12/221  bone]
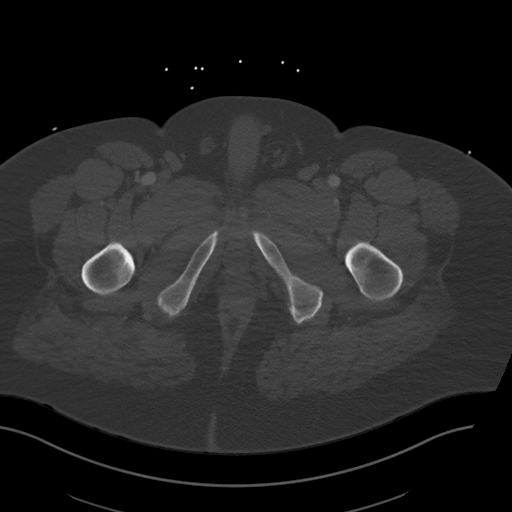
[im 35/221  soft-tissue]
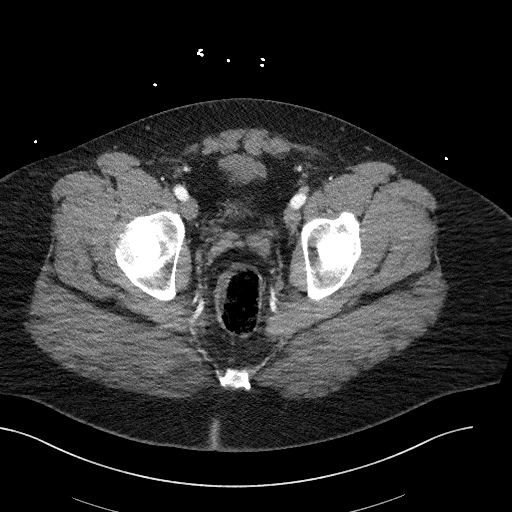
[im 58/221  soft-tissue]
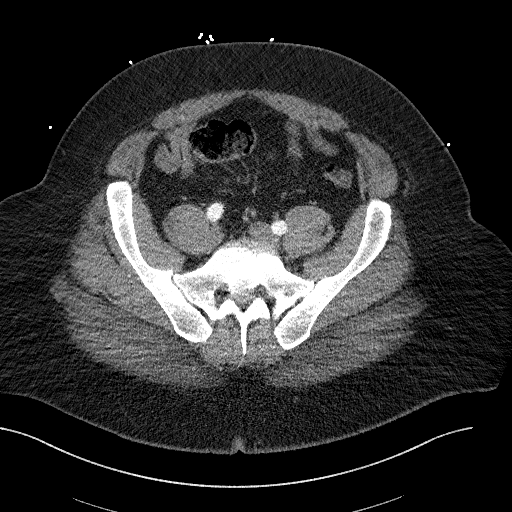
[im 82/221  soft-tissue]
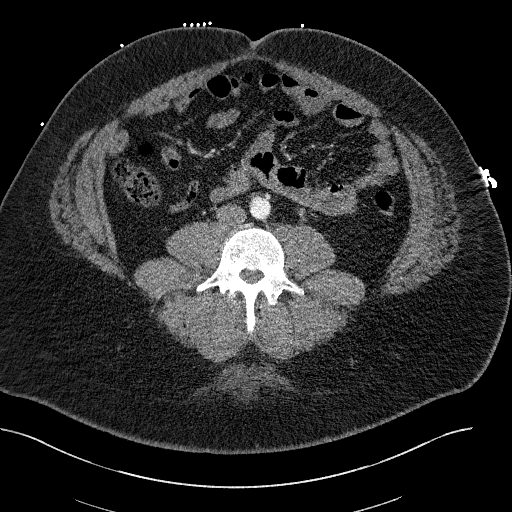
[im 105/221  soft-tissue]
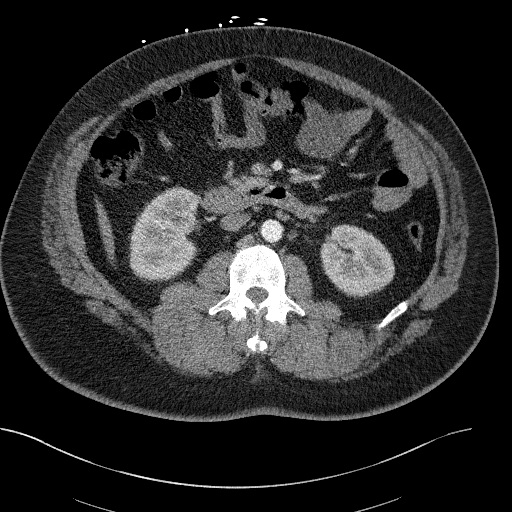
[im 116/221  soft-tissue]
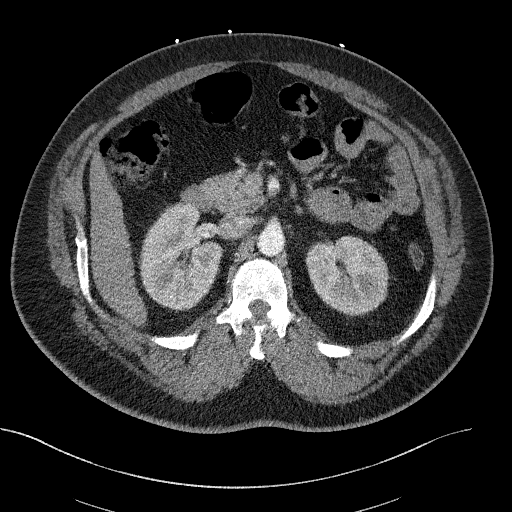
[im 139/221  soft-tissue]
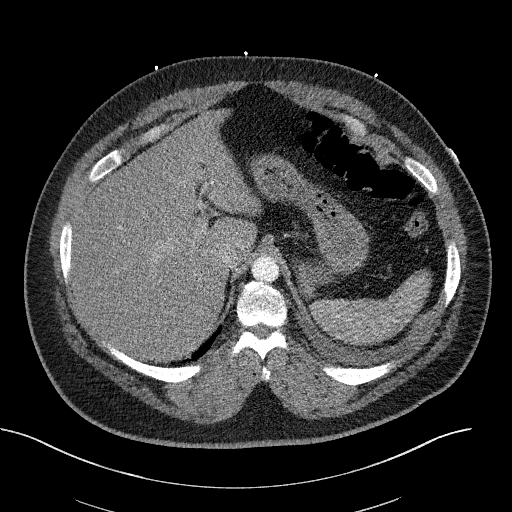
[im 163/221  soft-tissue]
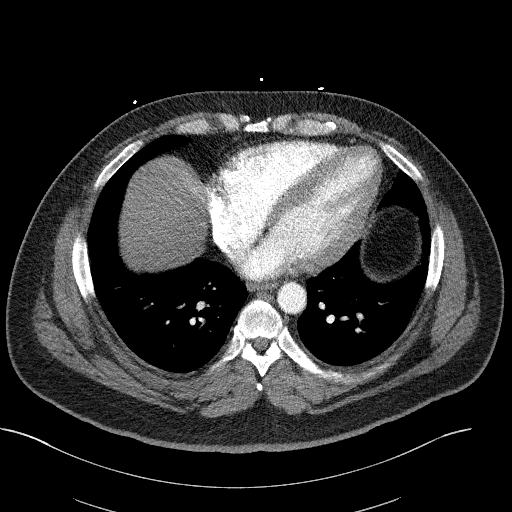
[im 186/221  soft-tissue]
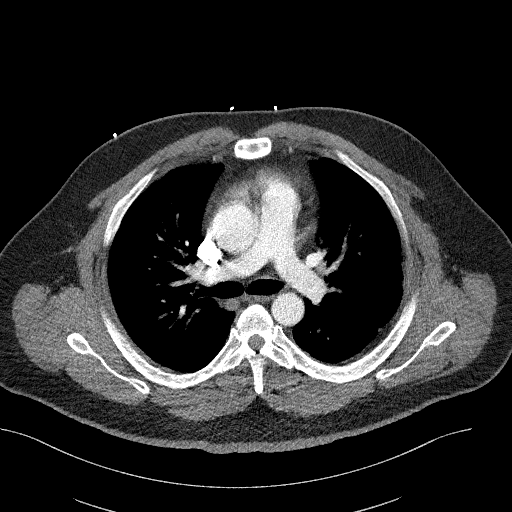
[im 186/221  bone]
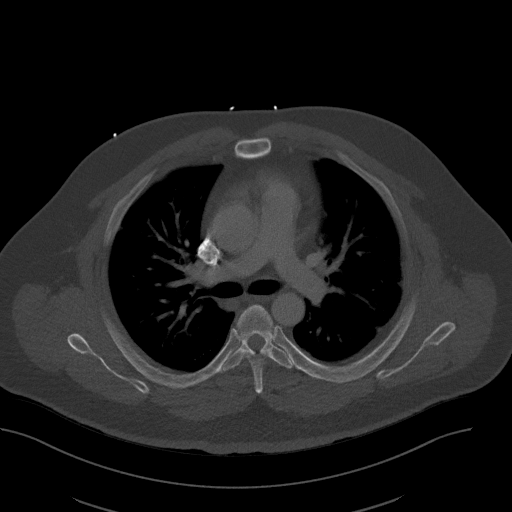
[im 209/221  soft-tissue]
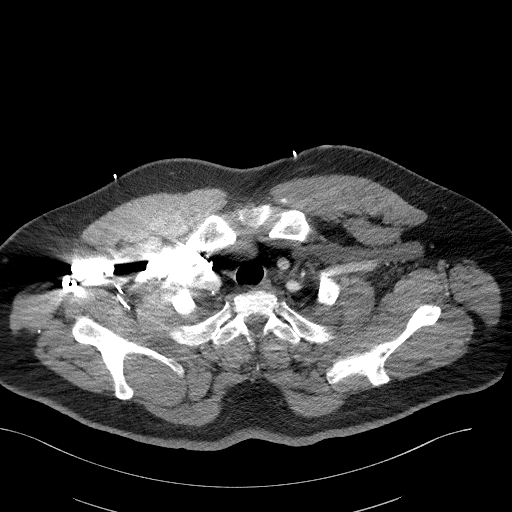

[Series 7: coronals · coronal · 0.90mm/px · 3 of 189 slices shown]
[im 48/189  soft-tissue]
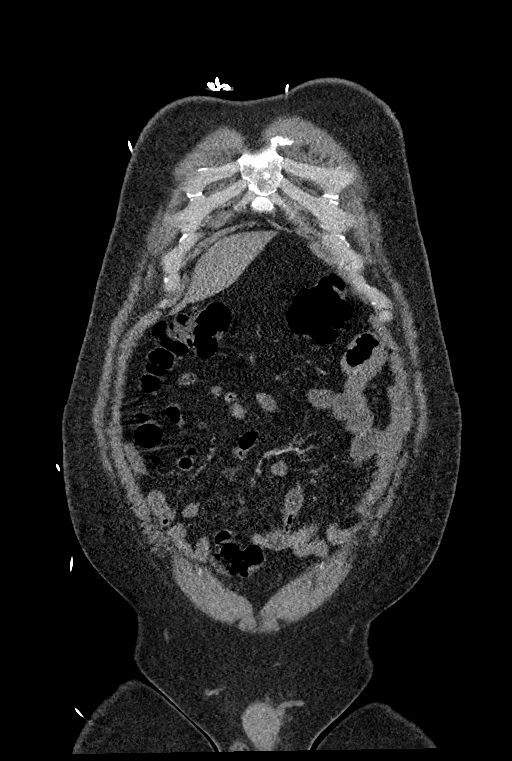
[im 95/189  soft-tissue]
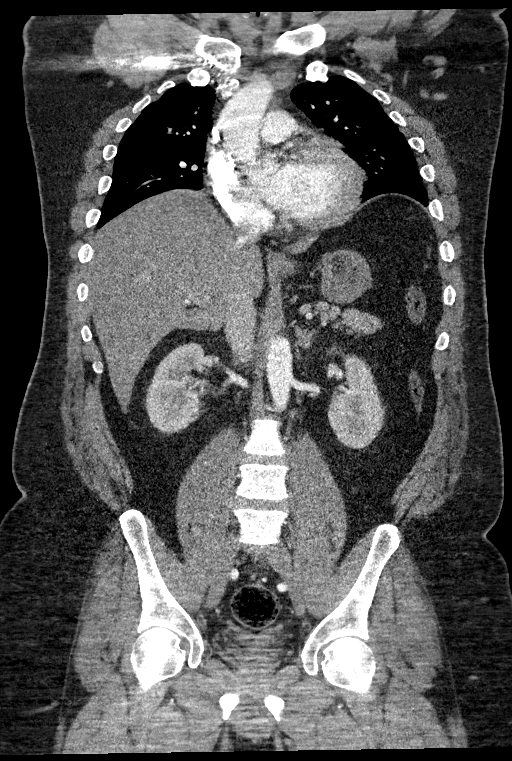
[im 142/189  soft-tissue]
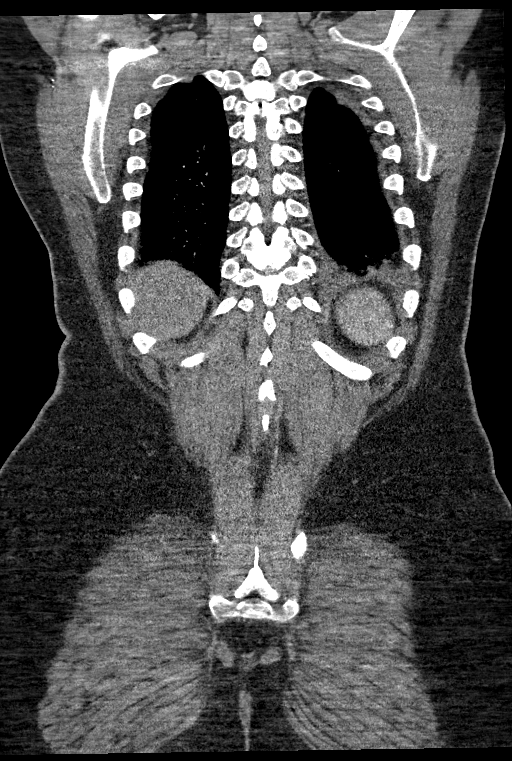

[13 of 46 positions shown; findings below may reference images not displayed]

FINDINGS: CTA CHEST FINDINGS

Cardiovascular: There is no cardiomegaly or pericardial effusion.
The thoracic aorta is unremarkable. No central pulmonary artery
embolus identified. Evaluation of the pulmonary arteries is limited
due to suboptimal opacification and timing of the contrast.

Mediastinum/Nodes: There is no hilar or mediastinal adenopathy.
Esophagus is grossly unremarkable. There is a 4 mm left thyroid
hypodense nodule. This can be further evaluated with ultrasound on a
nonemergent basis. No mediastinal fluid collection.

Lungs/Pleura: There is a small left pleural effusion. There is an 8
mm ground-glass nodular density in the right upper lobe. Minimal
bibasilar dependent atelectasis. No lobar consolidation or
pneumothorax. The central airways are patent.

Musculoskeletal: No acute osseous pathology. A 3.4 x 1.7 cm hazy
nodular density in the subcutaneous soft tissues of the left
posterior chest wall (series 4, image 27) is not characterized but
may represent an area of scarring. No drainable fluid collection.
Correlation with clinical exam recommended.

Review of the MIP images confirms the above findings.

CTA ABDOMEN AND PELVIS FINDINGS

VASCULAR

Aorta: Normal caliber aorta without aneurysm, dissection, vasculitis
or significant stenosis.

Celiac: Patent without evidence of aneurysm, dissection, vasculitis
or significant stenosis.

SMA: Patent without evidence of aneurysm, dissection, vasculitis or
significant stenosis.

Renals: Both renal arteries are patent without evidence of aneurysm,
dissection, vasculitis, fibromuscular dysplasia or significant
stenosis.

IMA: Patent without evidence of aneurysm, dissection, vasculitis or
significant stenosis.

Inflow: Patent without evidence of aneurysm, dissection, vasculitis
or significant stenosis.

Veins: No obvious venous abnormality within the limitations of this
arterial phase study.

Review of the MIP images confirms the above findings.

NON-VASCULAR

No intra-abdominal free air or free fluid.

Hepatobiliary: Fatty liver. No intrahepatic biliary ductal
dilatation. The gallbladder is predominantly contracted. Faint high
attenuation along the dependent gallbladder wall may represent small
stones versus artifact. No pericholecystic fluid or evidence of
acute cholecystitis by CT.

Pancreas: Unremarkable. No pancreatic ductal dilatation or
surrounding inflammatory changes.

Spleen: Normal in size without focal abnormality.

Adrenals/Urinary Tract: Adrenal glands are unremarkable. Kidneys are
normal, without renal calculi, focal lesion, or hydronephrosis.
Bladder is unremarkable.

Stomach/Bowel: Stomach is within normal limits. Appendix appears
normal. No evidence of bowel wall thickening, distention, or
inflammatory changes.

Lymphatic: No significant vascular findings are present. No enlarged
abdominal or pelvic lymph nodes.

Reproductive: The prostate and seminal vesicles are grossly
unremarkable. No pelvic mass.

Other: None

Musculoskeletal: No acute or significant osseous findings.

Review of the MIP images confirms the above findings.
IMPRESSION: 1. No aortic dissection or aneurysm.
2. Small left pleural effusion.
3. An 8 mm right upper lobe ground-glass nodule of indeterminate
etiology, possibly infectious or inflammatory. Initial follow-up
with CT at 6-12 months is recommended to confirm persistence. If
persistent, repeat CT is recommended every 2 years until 5 years of
stability has been established. This recommendation follows the
consensus statement: Guidelines for Management of Incidental
Pulmonary Nodules Detected on CT Images: From the [HOSPITAL]
4. A 3.4 x 1.7 cm hazy nodular density in the subcutaneous soft
tissues of the left posterior chest wall, possibly an area of
scarring. Correlation with clinical exam recommended. No drainable
fluid collection.
5. Fatty liver.
6. A 4 mm left thyroid hypodense nodule. Evaluation with ultrasound
on a nonemergent basis recommended.

## 2020-10-15 NOTE — Progress Notes (Signed)
Internal Medicine Clinic Attending  Case discussed with Dr. Masoudi  At the time of the visit.  We reviewed the resident's history and exam and pertinent patient test results.  I agree with the assessment, diagnosis, and plan of care documented in the resident's note.  

## 2020-10-19 ENCOUNTER — Ambulatory Visit (HOSPITAL_BASED_OUTPATIENT_CLINIC_OR_DEPARTMENT_OTHER): Admission: RE | Admit: 2020-10-19 | Payer: 59 | Source: Ambulatory Visit

## 2020-10-19 NOTE — Addendum Note (Signed)
Addended by: Maura Crandall on: 10/19/2020 02:16 PM   Modules accepted: Orders

## 2020-10-20 ENCOUNTER — Ambulatory Visit (HOSPITAL_BASED_OUTPATIENT_CLINIC_OR_DEPARTMENT_OTHER): Admission: RE | Admit: 2020-10-20 | Payer: 59 | Source: Ambulatory Visit

## 2020-10-20 ENCOUNTER — Other Ambulatory Visit: Payer: Self-pay

## 2020-10-20 ENCOUNTER — Other Ambulatory Visit (INDEPENDENT_AMBULATORY_CARE_PROVIDER_SITE_OTHER): Payer: 59

## 2020-10-20 DIAGNOSIS — R519 Headache, unspecified: Secondary | ICD-10-CM | POA: Diagnosis not present

## 2020-10-20 LAB — BASIC METABOLIC PANEL
Anion gap: 9 (ref 5–15)
BUN: 13 mg/dL (ref 6–20)
CO2: 25 mmol/L (ref 22–32)
Calcium: 9.1 mg/dL (ref 8.9–10.3)
Chloride: 104 mmol/L (ref 98–111)
Creatinine, Ser: 0.72 mg/dL (ref 0.61–1.24)
GFR, Estimated: >60 mL/min (ref >60)
Glucose, Bld: 89 mg/dL (ref 70–99)
Potassium: 4.6 mmol/L (ref 3.5–5.1)
Sodium: 138 mmol/L (ref 135–145)

## 2020-10-22 ENCOUNTER — Ambulatory Visit (HOSPITAL_BASED_OUTPATIENT_CLINIC_OR_DEPARTMENT_OTHER)
Admission: RE | Admit: 2020-10-22 | Discharge: 2020-10-22 | Disposition: A | Discharge status: Home / Self Care | Payer: 59 | Source: Ambulatory Visit | Attending: Student in an Organized Health Care Education/Training Program | Admitting: Student in an Organized Health Care Education/Training Program

## 2020-10-22 ENCOUNTER — Other Ambulatory Visit: Payer: Self-pay

## 2020-10-22 DIAGNOSIS — R519 Headache, unspecified: Secondary | ICD-10-CM | POA: Insufficient documentation

## 2020-10-22 MED ORDER — IOHEXOL 300 MG/ML  SOLN
100.0000 mL | Freq: Once | INTRAMUSCULAR | Status: AC | PRN
  Administered 2020-10-22: 80 mL via INTRAVENOUS

## 2020-10-25 ENCOUNTER — Other Ambulatory Visit: Payer: Self-pay | Admitting: Internal Medicine

## 2020-10-25 DIAGNOSIS — G4452 New daily persistent headache (NDPH): Secondary | ICD-10-CM

## 2020-10-25 NOTE — Progress Notes (Signed)
I called patient to talk about his CT scan and follow up of his headache. When I talked to his girl friend on Friday (before having CT resulted), she thought his headache is a little better. However, patient tells me today that it did not really change and still bothers him. He also reports progressive shoulder pain. Partricularly when he sleeps on his shoulder. (denies trauma). No other new symptoms. His head Ct scan showed partial empty sella but otherwise unremarkable. Pt is on immunosuppressive meds. I discussed pt with Dr. Oswaldo Done. Given no improvement of headache, I order brain MRI. Patient is notified. He will also get an appointment in [facility] to have his shoulder be evaluated and for follow up of headache and MRI result.  Chevis Pretty, MD [id number] 10/25/2020, 4:56 PM Pager: [phone] .

## 2020-10-26 ENCOUNTER — Other Ambulatory Visit (HOSPITAL_BASED_OUTPATIENT_CLINIC_OR_DEPARTMENT_OTHER): Payer: 59

## 2020-10-31 ENCOUNTER — Other Ambulatory Visit: Payer: Self-pay | Admitting: Internal Medicine

## 2020-11-01 NOTE — Telephone Encounter (Signed)
Please have this forwarded to his rheumatologist at [facility] as they are managing the associated condition.

## 2020-11-05 ENCOUNTER — Other Ambulatory Visit: Payer: Self-pay | Admitting: Internal Medicine

## 2020-11-05 DIAGNOSIS — R519 Headache, unspecified: Secondary | ICD-10-CM

## 2020-11-05 NOTE — Progress Notes (Signed)
I called the patient today to check on him again (for follow up of headache and talked to his girlfriend). Patient still has headache mostly during the day. No N/V. No visual changes. No aura, Ct head with partial empty sella and otherwise unremarkable. MRI has not been done yet. Will refer to neurology for further evaluation. Patient agrees. Reviewed strict ER precautions. Discussed with Dr. Oswaldo Done as well.

## 2020-11-15 ENCOUNTER — Encounter: Payer: Self-pay | Admitting: *Deleted

## 2020-11-25 ENCOUNTER — Ambulatory Visit (HOSPITAL_COMMUNITY)
Admission: RE | Admit: 2020-11-25 | Discharge: 2020-11-25 | Disposition: A | Discharge status: Home / Self Care | Payer: 59 | Source: Ambulatory Visit | Attending: Student in an Organized Health Care Education/Training Program | Admitting: Student in an Organized Health Care Education/Training Program

## 2020-11-25 ENCOUNTER — Other Ambulatory Visit: Payer: Self-pay

## 2020-11-25 ENCOUNTER — Other Ambulatory Visit (HOSPITAL_COMMUNITY): Payer: Self-pay | Admitting: Student in an Organized Health Care Education/Training Program

## 2020-11-25 DIAGNOSIS — G4452 New daily persistent headache (NDPH): Secondary | ICD-10-CM | POA: Insufficient documentation

## 2020-11-29 ENCOUNTER — Telehealth: Payer: Self-pay

## 2020-11-29 NOTE — Telephone Encounter (Signed)
Dr. Maryla Morrow, are you able to take a look at this to see if something further needs to be done before neurology appt in March?   Thanks!

## 2020-11-29 NOTE — Telephone Encounter (Signed)
Requesting MRI results, please call pt back.  

## 2020-11-30 ENCOUNTER — Telehealth: Payer: Self-pay | Admitting: Internal Medicine

## 2020-11-30 NOTE — Telephone Encounter (Signed)
Pt requesting a call back.  Pt states his Headaches are getting really bad and want's to know what to do.  PT states he had an MRI done on 11/25/2020 and he has not rec'd a call back.

## 2020-11-30 NOTE — Telephone Encounter (Signed)
Discussed results with patient. He has an appointment with neurology on 1/6 to discuss if further work-up needs to be done.   He would also like an appt with [facility] to discuss shoulder pain (Thurs morning if available).

## 2020-12-01 NOTE — Telephone Encounter (Signed)
Called patient N/A. LMOM for the patient to call back.

## 2020-12-02 ENCOUNTER — Ambulatory Visit: Payer: Medicaid Other | Admitting: Neurology

## 2020-12-02 ENCOUNTER — Encounter: Payer: Self-pay | Admitting: Neurology

## 2020-12-02 VITALS — BP 113/71 | HR 65 | Ht 72.0 in | Wt 233.0 lb

## 2020-12-02 DIAGNOSIS — G479 Sleep disorder, unspecified: Secondary | ICD-10-CM

## 2020-12-02 DIAGNOSIS — G932 Benign intracranial hypertension: Secondary | ICD-10-CM

## 2020-12-02 DIAGNOSIS — E236 Other disorders of pituitary gland: Secondary | ICD-10-CM | POA: Diagnosis not present

## 2020-12-02 DIAGNOSIS — E669 Obesity, unspecified: Secondary | ICD-10-CM

## 2020-12-02 DIAGNOSIS — R634 Abnormal weight loss: Secondary | ICD-10-CM

## 2020-12-02 DIAGNOSIS — G930 Cerebral cysts: Secondary | ICD-10-CM | POA: Diagnosis not present

## 2020-12-02 DIAGNOSIS — E66811 Obesity, class 1: Secondary | ICD-10-CM

## 2020-12-02 DIAGNOSIS — R351 Nocturia: Secondary | ICD-10-CM

## 2020-12-02 DIAGNOSIS — R519 Headache, unspecified: Secondary | ICD-10-CM | POA: Diagnosis not present

## 2020-12-02 DIAGNOSIS — M3313 Other dermatomyositis without myopathy: Secondary | ICD-10-CM

## 2020-12-02 DIAGNOSIS — H538 Other visual disturbances: Secondary | ICD-10-CM | POA: Diagnosis not present

## 2020-12-02 DIAGNOSIS — R0683 Snoring: Secondary | ICD-10-CM

## 2020-12-02 DIAGNOSIS — M3390 Dermatopolymyositis, unspecified, organ involvement unspecified: Secondary | ICD-10-CM

## 2020-12-02 NOTE — Patient Instructions (Signed)
You may have a condition called pseudotumor cerebri, which means that there increased fluid pressure around your brain and results in pressure on your brain, which can cause headache, and pressure on the eye nerve(s), which can cause blurry vision, even loss of vision. I would like to suggest a few things today:  1. We will do a brain MRV (for venogram) to look for narrowing of any of the main drainage veins in your brain. 2. We will get a formal eye exam, I will refer you to an ophthalmologist for a full, dilated, eye exam including visual field testing, to see if you have had any loss of peripheral vision.  3. We will request a LP (lumbar puncture/spinal tap) with pressure testing and routine fluid testing. We will call you with the results.  4.  If your spinal fluid pressure is indeed elevated, we will have you start a medication called diamox to help keep your spinal fluid pressure at bay.  Some people need more than 1 spinal tap over time. 5. As you know, your vision and visual field can be affected. The most serious complication of having pseudotumor cerebri is loss of vision, which can be permanent. Work up, at least initially with the eye specialist should include: best corrected visual acuity, formal visual field testing, dilated fundus examination with optic disc photographs, and often optical coherence tomography (OCT) of the optic nerve, retinal nerve fiber layer, and macular ganglion cell layer. Worsening vision is an indication for intensifying treatment.  6.  Ultimately, you may need to be considered for a shunt in the future (to prevent fluid pressure from building up over and over). 7. We will also do a sleep study to rule out obstructive sleep apnea.  If you have obstructive sleep apnea, I will want you to start treatment with a machine called CPAP or AutoPAP.  Treatment of obstructive sleep apnea can help headaches, also help with metabolism and weight loss, and ultimately, treatment of  sleep apnea can reduce risk for cardiovascular complications including heart disease and stroke risk.

## 2020-12-02 NOTE — Progress Notes (Signed)
Subjective:    Patient ID: Andrew Byrd is a 40 y.o. male.  HPI     Andrew Foley, MD, PhD [facility] [address] [address] [place], [place] [address]  Dear Drs. Masoudi and Oswaldo Done,   I saw your patient, Andrew Byrd, upon your kind request in my neurologic clinic today for initial consultation of his recurrent headaches. Patient is unaccompanied today. As you know, Andrew Byrd is a 40 year old right-handed gentleman with an underlying medical history of hypertension, vitamin D deficiency, reflux disease, dermatomyositis, on prednisone and Imuran, and obesity, who reports an approximately 2-1/30-month history of recurrent left-sided headaches.  The headache is in the left parietal area, fairly constant in nature, sometimes associated with nausea and photophobia, sometimes he has jolts of pain briefly.  He does have a history of migraines growing up but they were infrequent and this headache feels different, more like a pressure, feels almost like a sinus headache perpetually.  I reviewed your office note from 10/13/2020. He reported a several week history of headaches including nocturnal headaches. You ordered a head CT with and without contrast. He had this on 10/13/2020 and I reviewed the results:  IMPRESSION: No acute intracranial abnormality.  No mass or abnormal enhancement.   Partially empty sella. This is a nonspecific finding that can be seen in the setting of idiopathic intracranial hypertension.   He had a subsequent brain MRI without contrast on 11/25/2020 and I reviewed the results: IMPRESSION: 2.1 cm left middle cranial fossa arachnoid cyst. No acute intracranial process. Mildly expanded, partially empty sella is suspicious for idiopathic intracranial hypertension. Consider LP with opening and closing pressures for further evaluation.  He sees his ophthalmologist typically on a regular basis but missed an appointment in December, he sees  Dr. Dione Booze.  He sees his rheumatologist on a regular basis, he is currently on prednisone 5 mg strength and was taken off of his Imuran in December secondary to low white cell count. 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  He has had intermittent chills, he has been tested for Covid repeatedly in the past but not very recently.  He reports that he had a fever recently of 101 but it did not stay.  He is scheduled for his first dose of Covid vaccine tomorrow.  His Past Medical History Is Significant For: Past Medical History:  Diagnosis Date  . Headache   . Hypertension     His Past Surgical History Is Significant For: Past Surgical History:  Procedure Laterality Date  . MUSCLE BIOPSY     thigh, benign    His Family History Is Significant For: Family History  Problem Relation Age of Onset  . Hypertension Other   . Cancer Mother     His Social History Is Significant For: Social History   Socioeconomic History  . Marital status: Married    Spouse name: Andrew Byrd  . Number of children: 6  . Years of education: Not on file  . Highest education level: GED or equivalent  Occupational History    Comment: NA, disabled  Tobacco Use  . Smoking status: Never Smoker  . Smokeless tobacco: Never Used  . Tobacco comment: 1 per day   Vaping Use  . Vaping Use: Never used  Substance and Sexual Activity  . Alcohol use: No  . Drug use: Yes    Types: Marijuana    Comment: 12/02/20 3 x week-appetite  . Sexual activity: Yes  Partners: Female  Other Topics Concern  . Not on file  Social History Narrative   Lives with wife, children   Caffeine, sodas/tea- all day   Social Determinants of Health   Financial Resource Strain: Not on file  Food Insecurity: Not on file  Transportation Needs: Not on file  Physical Activity: Not on file  Stress: Not on file  Social Connections: Not on file    His Allergies Are:  Allergies  Allergen Reactions  . Other Anaphylaxis  and Swelling    Nut Allergy..Throat swelling.  . Anakinra Other (See Comments)    Mild transaminitis  . Penicillins Other (See Comments)    UNKNOWN REACTION  :   His Current Medications Are:  Outpatient Encounter Medications as of 12/02/2020  Medication Sig  . amLODipine (NORVASC) 5 MG tablet TAKE 1 TABLET BY MOUTH EVERY DAY  . ergocalciferol (VITAMIN D2) 1.25 MG (50000 UT) capsule Take 1 capsule (50,000 Units total) by mouth once a week.  . losartan (COZAAR) 50 MG tablet Take 1 tablet (50 mg total) by mouth daily.  . pantoprazole (PROTONIX) 40 MG tablet Take 1 tablet (40 mg total) by mouth daily.  . predniSONE (DELTASONE) 5 MG tablet TAKE 1 TABLET BY MOUTH EVERY DAY  . pyridOXINE (VITAMIN B-6) 100 MG tablet Take 100 mg by mouth daily.  Marland Kitchen azaTHIOprine (IMURAN) 50 MG tablet Take 50 mg by mouth 2 (two) times daily. (Patient not taking: Reported on 12/02/2020)  . [DISCONTINUED] predniSONE (DELTASONE) 1 MG tablet Take 1 tablet (1 mg total) by mouth daily with breakfast. Use to complete taper as instructed.  . [DISCONTINUED] predniSONE (DELTASONE) 20 MG tablet Take 20 mg by mouth daily with breakfast. 4 tablets daily for 5 days 3 tablets daily for 14 days 2 tablets daily for 14 days 1.5 tablet daily   No facility-administered encounter medications on file as of 12/02/2020.  :   Review of Systems:  Out of a complete 14 point review of systems, all are reviewed and negative with the exception of these symptoms as listed below:  Review of Systems  Neurological:       12/02/20 new pt here with wife, Andrew Byrd for headaches,"MRI brain on 11/25/20 showed 2 cm growth on my brain"     Objective:  Neurological Exam  Physical Exam Physical Examination:   Vitals:   12/02/20 0832  BP: 113/71  Pulse: 65    General Examination: The patient is a very pleasant 40 y.o. male in no acute distress. He appears well-developed and well-nourished and well groomed.   HEENT: Normocephalic, atraumatic,  pupils are equal, round and reactive to light and accommodation. Funduscopic exam is normal, no obvious papilledema noted.  He does not have any visual field restriction by finger perimetric.  Hearing is grossly intact, he has a mild erythema around the forehead, periorbitally bilaterally, and a malar rash, mild swelling around upper eyelids bilaterally.  Airway examination reveals mild airway crowding.  Tongue protrudes centrally and palate elevates symmetrically.  Neck is supple with full range of motion.  No carotid bruits.  Chest: Clear to auscultation without wheezing, rhonchi or crackles noted.  Heart: S1+S2+0, regular and normal without murmurs, rubs or gallops noted.   Abdomen: Soft, non-tender and non-distended with normal bowel sounds appreciated on auscultation.  Extremities: There is no pitting edema in the distal lower extremities bilaterally. Pedal pulses are intact.  Skin: Warm and dry without trophic changes noted. There are no varicose veins.  Musculoskeletal: exam reveals tenderness  and pain right shoulder area, with decreased range of motion in the right shoulder joint, bilateral knee pain.    Neurologically:  Mental status: The patient is awake, alert and oriented in all 4 spheres. His immediate and remote memory, attention, language skills and fund of knowledge are appropriate. There is no evidence of aphasia, agnosia, apraxia or anomia. Speech is clear with normal prosody and enunciation. Thought process is linear. Mood is normal and affect is normal.  Cranial nerves II - XII are as described above under HEENT exam. In addition: shoulder shrug is normal with equal shoulder height noted. Motor exam: Normal bulk, strength and tone is noted, with mild decrease in muscle profile in the first dorsal interosseous muscle, grip strength fairly good, decreased strength in the right proximal arm secondary to pain and with knee extension secondary to pain.  Overall strength fairly good  and at least 4+ throughout. Romberg is negative. Reflexes are 2+ throughout. Babinski: Toes are flexor bilaterally. Fine motor skills and coordination: intact with normal finger taps, normal hand movements, normal rapid alternating patting, normal foot taps and normal foot agility.  Cerebellar testing: No dysmetria or intention tremor on finger to nose testing. Heel to shin is unremarkable bilaterally. There is no truncal or gait ataxia.  Sensory exam: intact to light touch, vibration, temperature sense in the upper and lower extremities.  Gait, station and balance: He stands with mild difficulty, he has some aching in the knees particularly.  He walks without shuffling, preserved arm swing is noted, no obvious limp.  Tandem walk is slightly difficult in the beginning but doable, he is wearing thick socks and crocs.   Assessment and Plan:  In summary, Andrew Byrd is a very pleasant 40 y.o.-year old male with an underlying medical history of hypertension, vitamin D deficiency, reflux disease, dermatomyositis, on prednisone and Imuran, and obesity, who presents for evaluation of his headache disorder.  While he does have a history of migraines in the past, his recent onset headache on the left side is different from his typical migraines in the past.  He has had a CT and a brain MRI.  He was found to have an arachnoid cyst which is typically benign and congenital, I explained this to the patient and his wife.  He was also found to have an empty sella.  Sometimes this is associated with idiopathic intracranial hypertension.  I explained this to the patient at length and also the link between IIH and recurrent headaches.  On my examination he does not have any obvious papilledema but it is a difficult exam with the visor on.  He is advised to seek a follow-up appointment sooner than scheduled with his ophthalmologist and I also made a referral in that regard particularly to screen for papilledema.  We will also  request a full visual field test.  He is advised to proceed with additional testing to investigate this headache.  I would like for him to have a sleep study to look for underlying obstructive sleep apnea, and his untreated sleep apnea can cause headaches including nocturnal headaches.  In addition, I would like for him to have an MR venogram.  We will request also a lumbar puncture through radiology under fluoroscopic guidance for opening and closing pressure testing and  routine labs in the CSF.  I explained the spinal tap procedures to the patient and his wife at length as well.  This was an extended appointment.  I did not suggest any new  medications quite yet.  We may start Diamox depending on the results of the lumbar puncture.  If he has obstructive sleep apnea, he is advised we will plan to follow-up soon after testing and keep them posted as to his results by phone call in the interim.  that I would like for him to try CPAP or AutoPap therapy.  I answered all their questions today and the patient and his wife were in agreement with the plan.   Thank you very much for allowing me to participate in the care of this nice patient. If I can be of any further assistance to you please do not hesitate to call me at [phone].  Sincerely,   Star Age, MD, PhD

## 2020-12-02 NOTE — Progress Notes (Signed)
Subjective:    Patient ID: Andrew Byrd is a 40 y.o. male.  HPI Review of Systems  Neurological:       12/02/20 Epworth Sleepiness Scale 0= would never doze 1= slight chance of dozing 2= moderate chance of dozing 3= high chance of dozing  Sitting and reading:2 Watching TV:0 Sitting inactive in a public place (ex. Theater or meeting):1 As a passenger in a car for an hour without a break:1 Lying down to rest in the afternoon:3 Sitting and talking to someone:0 Sitting quietly after lunch (no alcohol):0 In a car, while stopped in traffic:0 Total:7    Objective:  Neurological Exam  Physical Exam

## 2020-12-07 ENCOUNTER — Telehealth: Payer: Self-pay | Admitting: Neurology

## 2020-12-07 NOTE — Telephone Encounter (Signed)
medicaid [facility]: [id number] (exp. 12/07/20 to 01/06/21) order faxed to [facility] bc that was who was in net work for the MRI they will reach out to the patient ot schedule

## 2020-12-09 ENCOUNTER — Other Ambulatory Visit: Payer: 59

## 2020-12-14 ENCOUNTER — Other Ambulatory Visit: Payer: Self-pay

## 2020-12-14 ENCOUNTER — Telehealth: Payer: Self-pay | Admitting: Neurology

## 2020-12-14 ENCOUNTER — Encounter: Payer: Self-pay | Admitting: Neurology

## 2020-12-14 ENCOUNTER — Ambulatory Visit
Admission: RE | Admit: 2020-12-14 | Discharge: 2020-12-14 | Disposition: A | Discharge status: Home / Self Care | Payer: 59 | Source: Ambulatory Visit | Attending: Neurology | Admitting: Neurology

## 2020-12-14 DIAGNOSIS — R351 Nocturia: Secondary | ICD-10-CM

## 2020-12-14 DIAGNOSIS — G932 Benign intracranial hypertension: Secondary | ICD-10-CM

## 2020-12-14 DIAGNOSIS — R519 Headache, unspecified: Secondary | ICD-10-CM

## 2020-12-14 DIAGNOSIS — H538 Other visual disturbances: Secondary | ICD-10-CM

## 2020-12-14 DIAGNOSIS — R0683 Snoring: Secondary | ICD-10-CM

## 2020-12-14 DIAGNOSIS — G930 Cerebral cysts: Secondary | ICD-10-CM

## 2020-12-14 DIAGNOSIS — G479 Sleep disorder, unspecified: Secondary | ICD-10-CM

## 2020-12-14 DIAGNOSIS — R634 Abnormal weight loss: Secondary | ICD-10-CM

## 2020-12-14 DIAGNOSIS — E236 Other disorders of pituitary gland: Secondary | ICD-10-CM

## 2020-12-14 DIAGNOSIS — M3390 Dermatopolymyositis, unspecified, organ involvement unspecified: Secondary | ICD-10-CM

## 2020-12-14 DIAGNOSIS — E669 Obesity, unspecified: Secondary | ICD-10-CM

## 2020-12-14 MED ORDER — ACETAZOLAMIDE ER 500 MG PO CP12: 500.0000 mg | ORAL_CAPSULE | Freq: Two times a day (BID) | ORAL | 5 refills | Status: DC

## 2020-12-14 NOTE — Progress Notes (Signed)
Blood withdrawn from pts Left hand to be sent with LP lab work. Pt tolerated well. 1 successful attempt. Gauze applied after. Clean, dry, intact.

## 2020-12-14 NOTE — Telephone Encounter (Signed)
Please also see result note for LP.  Diamox Rx sent to pharmacy.

## 2020-12-14 NOTE — Progress Notes (Signed)
Please call patient or his wife regarding his lumbar puncture.  Test results are pending but initial results are benign on the specimens sent off for lab testing.  His opening pressure meeting the spinal fluid pressure was indeed elevated as suspected, after taking of fluid, his pressure was normalized.  This can often immediately help with the headaches.  Nevertheless, as discussed, I would like for him to start a medication called Diamox.  We discussed this during the office visit. I will send Rx to pharmacy: Diamox (generic name: acetazolamide) 500 mg strength: Take one pill each night at bedtime for 1 week, then 1 pill twice daily thereafter. We can increase over time, if needed. Common side effects reported are: Dizziness, lightheadedness, increase in urine output, blurry vision, dry mouth, drowsiness, loss of appetite, upset stomach, headache and tiredness, tingling in the hands and feet and change in taste, especially with carbonated sodas. Most side effects are transient especially during the first few days as the body adjusts to the medication.

## 2020-12-14 NOTE — Telephone Encounter (Signed)
schedule for 1/31 1030AM [facility]

## 2020-12-14 NOTE — Discharge Instructions (Signed)

## 2020-12-14 NOTE — Telephone Encounter (Signed)
12/14/20 lvm asking pt to call me back to discuss LP results.

## 2020-12-15 ENCOUNTER — Encounter: Payer: Medicaid Other | Admitting: Internal Medicine

## 2020-12-15 ENCOUNTER — Telehealth: Payer: Self-pay

## 2020-12-15 ENCOUNTER — Telehealth: Payer: Self-pay | Admitting: Neurology

## 2020-12-15 NOTE — Telephone Encounter (Signed)
LVM for pt to call me back to schedule sleep study  

## 2020-12-15 NOTE — Telephone Encounter (Signed)
Pt responded on my chart to discuss results. See messages from 12/14/20 and 12/15/20 for reference.

## 2020-12-15 NOTE — Telephone Encounter (Addendum)
Pt replied to recent my chart message stating  Andrew Clines, MD 49 minutes ago (10:43 AM)  Dr Dione Booze appointment is scheduled on January 25th and ok I will discuss this medication with Dr Sinclair Grooms from rheumatology and start with the 1 pill and slowly increase so we can monitor blood count cells, thank you.  I responded back to the pt and advised him to let us know if he has any questions/concerns once starting the med.

## 2020-12-15 NOTE — Telephone Encounter (Signed)
Please call patient back regarding his concern about starting acetazolamide.  I understand his concern.  However, I also think it is very important that we keep his intracranial fluid pressure at bay.  He can start with 1 pill once daily and see how it goes and increase it more slowly. Has he seen Dr. Dione Booze yet for formal eye examination as I made a referral specifically after our visit? I recommend that he discuss starting acetazolamide also with his rheumatologist who is managing his immunosuppressive medication and see if they have any other reservation.  This is probably the best medication we can start and it is the first line treatment for idiopathic intracranial hypertension.  Other medications can also have side effects.  It may just be a matter of starting slowly and monitoring for side effects and cell count changes.

## 2020-12-15 NOTE — Telephone Encounter (Signed)
I have reached out to the pt via my chart and relayed MD's recommendation.  Pt was advised I could call him an review this if he would like and to let me know.  Will wait for pt's reply.

## 2020-12-16 ENCOUNTER — Telehealth: Payer: Self-pay

## 2020-12-16 MED ORDER — ACETAZOLAMIDE ER 500 MG PO CP12: 500.0000 mg | ORAL_CAPSULE | Freq: Two times a day (BID) | ORAL | 5 refills | Status: DC

## 2020-12-16 NOTE — Telephone Encounter (Addendum)
Pt and pt's wife called in to discuss h/a.  Pt had LP completed on 12/14/2020 and has noticed today and last night a headache that comes and goes. He reports little change in pain/severity of headache when he changes positions.  I asked the pt if he has started the Diamox 500 mg as per Dr. Teofilo Pod recommendation. He stated he has not.  I discussed this with Dr. Frances Furbish and she recommended the pt start the medication as soon as possible.  I reviewed the importance of picking the Diamox up and starting the medication tonight if possible and pt verbalized understanding and stated he would get the medication this evening. I did reiterate that he should talk with his Rheumatologist and get their medical opinion on this medication and see if they had any reservations on the current plan of treatment. Pt verbalized understanding.

## 2020-12-20 NOTE — Telephone Encounter (Signed)
Pt. states he has had constant headaches since last Thursday. He states they worsen at times with extreme sudden pain. He also asks when can he up medication. Please advise.

## 2020-12-20 NOTE — Telephone Encounter (Signed)
I called the pt. He sts he started Diamox 500 mg 1 per day on 12/16/2020. Pt sts since starting the medication his headaches have not improved. He reports some sensitivity to light and reports pain at worst being a 9. He has been taking otc tylenol without benefit. He wanted to know if he could go increase to the 2 tablets at night now instead of waiting until 12/23/2020. I advised Dr. Frances Furbish is out of the office right now but I would speak with the covering MD during her absence.

## 2020-12-20 NOTE — Telephone Encounter (Signed)
I spoke with Dr. Vickey Huger in regards to this message. She states the pt should give the Diamox a little more time to affect since he was . Pt has been advised that if his h/a persis days late in pick up the med I advised the pt to call us back on Wed and we can re- assess. Pt verbalized understanding and was agreeable to the plan. He had no further questions/concerns at this time.

## 2020-12-21 LAB — CSF CELL COUNT WITH DIFFERENTIAL
RBC Count, CSF: 1 cells/uL — ABNORMAL HIGH
WBC, CSF: 0 cells/uL (ref 0–5)

## 2020-12-21 LAB — CSF CULTURE W GRAM STAIN
MICRO NUMBER:: 11428511
Result:: NO GROWTH
SPECIMEN QUALITY:: ADEQUATE

## 2020-12-21 LAB — VDRL, CSF: VDRL Quant, CSF: NONREACTIVE

## 2020-12-21 LAB — OLIGOCLONAL BANDS, CSF + SERM

## 2020-12-21 LAB — GLUCOSE, CSF: Glucose, CSF: 46 mg/dL (ref 40–80)

## 2020-12-21 LAB — PROTEIN, CSF: Total Protein, CSF: 19 mg/dL (ref 15–45)

## 2020-12-22 ENCOUNTER — Telehealth: Payer: Self-pay

## 2020-12-22 NOTE — Telephone Encounter (Signed)
Pt is asking to speak with Dr Frances Furbish re: the cyst on his brain, pt states he has spoken with RN, he would like a call from Dr Frances Furbish

## 2020-12-22 NOTE — Telephone Encounter (Signed)
Please take information regarding his questions on his scan.  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  No further action is typically required for an arachnoid cyst.

## 2020-12-22 NOTE — Telephone Encounter (Signed)
LVM for pt to call me back to schedule sleep study  

## 2020-12-22 NOTE — Telephone Encounter (Signed)
I called pt. No answer, left a message asking pt to call me back. Also stated he could send a my chart message and I could relay questions to.

## 2020-12-23 ENCOUNTER — Telehealth: Payer: Self-pay | Admitting: Neurology

## 2020-12-23 ENCOUNTER — Encounter: Payer: Medicaid Other | Admitting: Internal Medicine

## 2020-12-23 NOTE — Telephone Encounter (Signed)
Please offer FU appt with NP or myself next available to discuss treatment options, shunt evaluation, medication adjustment, referral to another specialist, such as at [facility].

## 2020-12-23 NOTE — Telephone Encounter (Signed)
FYI: I received an office note from Earley Brooke, patient saw Alma Downs, [place] on 12/21/2020.  In summary, patient was not noted to have any gross edema of the optic nerves, possible nasal edema of optic nerves but no blind spot enlargement on visual field testing.  1 month follow-up with repeat visual field testing and OCT was recommended.

## 2020-12-23 NOTE — Telephone Encounter (Signed)
Noted, thank you

## 2020-12-23 NOTE — Telephone Encounter (Signed)
As previously discussed, he can increase the Diamox to 1 pill twice daily after 1 week.  This is as long as his rheumatologist is okay with him taking the Diamox.  I would not recommend adding Topamax at this time, we can review side effects when we consider it.

## 2020-12-23 NOTE — Telephone Encounter (Signed)
I have reached out to the pt via my chart in regards to this message.

## 2020-12-23 NOTE — Telephone Encounter (Signed)
Pt advised via my chart.

## 2020-12-23 NOTE — Telephone Encounter (Signed)
Next available appointment unfortunately is not until 01/10/2021. Is there any recommendations to provide for pt at this time pending appt?

## 2020-12-23 NOTE — Telephone Encounter (Signed)
I recommend he see Dr. Dione Booze to see if there is swelling of the eye background called papilledema.  I have not seen any records come across, hasn't seen him yet? I also recommend that he get a full visual field test done.  We may have to send him back for another lumbar puncture if the need arises.  We can increase the Diamox further.  Has been taking 500 mg twice daily?  We can also consider adding Topamax as a secondary medication.  Has he scheduled the sleep study yet?

## 2020-12-27 ENCOUNTER — Telehealth: Payer: Self-pay | Admitting: Neurology

## 2020-12-27 NOTE — Telephone Encounter (Signed)
Faxed the order fax # [phone].

## 2020-12-27 NOTE — Telephone Encounter (Signed)
I have printed req for Dr. Frances Furbish to sign and will provide to MRI coordinator once completed.

## 2020-12-27 NOTE — Telephone Encounter (Signed)
[facility] Dereck Leep) called, order for MRI was not signed by physician. Please resend the order with physician's signature.  Contact info: [phone].

## 2020-12-28 ENCOUNTER — Encounter: Payer: Self-pay | Admitting: Internal Medicine

## 2020-12-28 ENCOUNTER — Other Ambulatory Visit: Payer: Self-pay

## 2020-12-28 ENCOUNTER — Ambulatory Visit (INDEPENDENT_AMBULATORY_CARE_PROVIDER_SITE_OTHER): Payer: 59 | Admitting: Internal Medicine

## 2020-12-28 VITALS — BP 118/75 | HR 100 | Temp 98.0°F | Ht 72.0 in | Wt 229.2 lb

## 2020-12-28 DIAGNOSIS — M7989 Other specified soft tissue disorders: Secondary | ICD-10-CM

## 2020-12-28 DIAGNOSIS — G932 Benign intracranial hypertension: Secondary | ICD-10-CM | POA: Diagnosis not present

## 2020-12-28 DIAGNOSIS — M3390 Dermatopolymyositis, unspecified, organ involvement unspecified: Secondary | ICD-10-CM

## 2020-12-28 DIAGNOSIS — R229 Localized swelling, mass and lump, unspecified: Secondary | ICD-10-CM

## 2020-12-28 DIAGNOSIS — L219 Seborrheic dermatitis, unspecified: Secondary | ICD-10-CM

## 2020-12-28 DIAGNOSIS — L989 Disorder of the skin and subcutaneous tissue, unspecified: Secondary | ICD-10-CM

## 2020-12-28 MED ORDER — IBUPROFEN 200 MG PO TABS: 400.0000 mg | ORAL_TABLET | Freq: Two times a day (BID) | ORAL | 2 refills | Status: DC | PRN

## 2020-12-28 MED ORDER — INDOMETHACIN 25 MG PO CAPS: 25.0000 mg | ORAL_CAPSULE | Freq: Two times a day (BID) | ORAL | 0 refills | Status: DC | PRN

## 2020-12-28 MED ORDER — PANTOPRAZOLE SODIUM 40 MG PO TBEC
40.0000 mg | DELAYED_RELEASE_TABLET | Freq: Every day | ORAL | 1 refills | Status: AC
Start: 2020-12-28 — End: ?

## 2020-12-28 NOTE — Progress Notes (Signed)
Acute Office Visit  Subjective:    Patient ID: Andrew Byrd, male    DOB: March 11, 1981, 40 y.o.   MRN: [id number]  Chief Complaint  Patient presents with  . Follow-up    STILL HAVING HEADACHES # 3 CHECK KNEES (KNOTS) RIGHT SHOULDER PAIN # 7 DERMATOLOGY REFERRAL  CC: Painful lump on back of knee, F/u of headache  HPI 40 y/o male with with PMHx as documented below, presented for f/u of hedache and also reports painful lump behind the left knee. The lump has been there for 2 month but he has similar bump in his abdomen before. Please refer to problem based charting for further details and assessment and plan of current problem and chronic medical conditions.  Past Medical History:  Diagnosis Date  . Dermatomyositis (HCC)   . Headache   . Hypertension     Past Surgical History:  Procedure Laterality Date  . MUSCLE BIOPSY     thigh, benign    Family History  Problem Relation Age of Onset  . Hypertension Other   . Cancer Mother     Social History   Socioeconomic History  . Marital status: Married    Spouse name: Derek Jack  . Number of children: 6  . Years of education: Not on file  . Highest education level: GED or equivalent  Occupational History    Comment: NA, disabled  Tobacco Use  . Smoking status: Never Smoker  . Smokeless tobacco: Never Used  . Tobacco comment: 1 per day   Vaping Use  . Vaping Use: Never used  Substance and Sexual Activity  . Alcohol use: No  . Drug use: Yes    Types: Marijuana    Comment: 12/02/20 3 x week-appetite  . Sexual activity: Yes    Partners: Female  Other Topics Concern  . Not on file  Social History Narrative   Lives with wife, children   Caffeine, sodas/tea- all day   Social Determinants of Health   Financial Resource Strain: Not on file  Food Insecurity: Not on file  Transportation Needs: Not on file  Physical Activity: Not on file  Stress: Not on file  Social Connections: Not on file  Intimate Partner Violence:  Not on file    Outpatient Medications Prior to Visit  Medication Sig Dispense Refill  . acetaZOLAMIDE (DIAMOX) 500 MG capsule Take 1 capsule (500 mg total) by mouth 2 (two) times daily. Initial titration: 1 pill nightly at bedtime for 1 week, then 1 pill twice daily thereafter. For refills: 1 pill twice daily 60 capsule 5  . amLODipine (NORVASC) 5 MG tablet TAKE 1 TABLET BY MOUTH EVERY DAY 90 tablet 1  . azaTHIOprine (IMURAN) 50 MG tablet Take 50 mg by mouth 2 (two) times daily. (Patient not taking: Reported on 12/02/2020)    . ergocalciferol (VITAMIN D2) 1.25 MG (50000 UT) capsule Take 1 capsule (50,000 Units total) by mouth once a week. 12 capsule 1  . losartan (COZAAR) 50 MG tablet Take 1 tablet (50 mg total) by mouth daily. 90 tablet 1  . predniSONE (DELTASONE) 5 MG tablet TAKE 1 TABLET BY MOUTH EVERY DAY 30 tablet 1  . pyridOXINE (VITAMIN B-6) 100 MG tablet Take 100 mg by mouth daily.    . pantoprazole (PROTONIX) 40 MG tablet Take 1 tablet (40 mg total) by mouth daily. 90 tablet 1   No facility-administered medications prior to visit.    Allergies  Allergen Reactions  . Other Anaphylaxis and Swelling  Nut Allergy..Throat swelling.  . Anakinra Other (See Comments)    Mild transaminitis  . Penicillins Other (See Comments)    UNKNOWN REACTION    Review of Systems  Constitutional: Negative for chills and fever.  Skin:       lump on back pf left knee and in abdomen   Neurological: Positive for headaches.       Objective:      Physical Exam Cardiovascular:     Rate and Rhythm: Normal rate and regular rhythm.     Heart sounds: No murmur heard.   Pulmonary:     Effort: Pulmonary effort is normal.     Breath sounds: No wheezing or rales.  Abdominal:     Palpations: Abdomen is soft.     Tenderness: There is no abdominal tenderness.  Musculoskeletal:     Right lower leg: No edema.     Left lower leg: No edema.     Comments: Lumps in abdominal wall (chronic)  Skin:     Comments: Lump in posteriomedial aspect of left knee, and abdomen. Slightly tender.  Neurological:     Mental Status: He is oriented to person, place, and time.     BP 118/75 (BP Location: Right Arm, Patient Position: Sitting, Cuff Size: Normal)   Pulse 100   Temp 98 F (36.7 C) (Oral)   Ht 6' (1.829 m)   Wt 229 lb 3.2 oz (104 kg)   SpO2 100%   BMI 31.09 kg/m  Wt Readings from Last 3 Encounters:  12/28/20 229 lb 3.2 oz (104 kg)  12/02/20 233 lb (105.7 kg)  10/13/20 248 lb 6.4 oz (112.7 kg)    Health Maintenance Due  Topic Date Due  . PNEUMOCOCCAL POLYSACCHARIDE VACCINE AGE 65-64 HIGH RISK  Never done  . OPHTHALMOLOGY EXAM  Never done  . COVID-19 Vaccine (1) Never done  . TETANUS/TDAP  Never done  . INFLUENZA VACCINE  Never done    There are no preventive care reminders to display for this patient.   Lab Results  Component Value Date   TSH 2.188 11/25/2019   Lab Results  Component Value Date   WBC 3.0 (L) 06/09/2020   HGB 11.8 (L) 06/09/2020   HCT 34.7 (L) 06/09/2020   MCV 97 06/09/2020   PLT 230 06/09/2020   Lab Results  Component Value Date   NA 138 10/20/2020   K 4.6 10/20/2020   CO2 25 10/20/2020   GLUCOSE 89 10/20/2020   BUN 13 10/20/2020   CREATININE 0.72 10/20/2020   BILITOT 0.5 06/09/2020   ALKPHOS 52 06/09/2020   AST 19 06/09/2020   ALT 16 06/09/2020   PROT 6.2 06/09/2020   ALBUMIN 4.0 06/09/2020   CALCIUM 9.1 10/20/2020   ANIONGAP 9 10/20/2020   No results found for: CHOL No results found for: HDL No results found for: LDLCALC No results found for: TRIG No results found for: CHOLHDL No results found for: XAJO8N     Assessment & Plan:   Problem List Items Addressed This Visit      Nervous and Auditory   Idiopathic intracranial hypertension - Primary    Patient was diagnosed with IHH recently, with increased opening pressure on LP, after was referred to neurologist for headache and partially empty sella on head imaging. He has started  Acetazolamide (started about 2 weeks ago) but he states he still has the headache. No new symptoms. No N/V, no vision change or new neurologic symptoms. We talked about other pharmacologic  agents such as Topamax. He agrees that we defer this to his neurologist. He has an upcoming appointment. Meanwhile, he can take Ibuprofen PRN for headache and will let us or his neurologist know if any change in his symptoms. -Continue Acetazolamide 500 mg BID -F/u w neurology. Appreciate recommendations -Ibuprofen 400 mg  BID PRN. Instructed to take Pantoprazole 40 mg QD with that -Placing ambulatory referral to  ophthalmology exam for peripheral vision analysis      Relevant Medications   pantoprazole (PROTONIX) 40 MG tablet   ibuprofen (ADVIL) 200 MG tablet   Other Relevant Orders   Ambulatory referral to Ophthalmology     Musculoskeletal and Integument   Dermatomyositis (HCC) (Chronic)    Patient asks for a referral to dermatologist for scaling of scalp and dry skin. Although it can be seborrheic dermatitis, given Hx of dermatomyositis, agree with referring him to derm as well. He is on Azathioprine and Prednisone for dermatomyositis.       Relevant Orders   Ambulatory referral to Dermatology     Other   Soft tissue mass    Patient presented with 1 month Hx of lump on back of left knee that bothers him sometime. 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  The lump on back of his knee, is mildly tender. It does not appear to be baker cyst or thrombophlebitis on exam. Will do soft tissue ultrasound of posteromedial aspect of left knee.      Relevant Orders   [place] LT LOWER EXTREM LTD SOFT TISSUE NON VASCULAR       Meds ordered this encounter  Medications  . DISCONTD: indomethacin (INDOCIN) 25 MG capsule    Sig: Take 1 capsule (25 mg total) by mouth 2 (two) times daily as needed for moderate pain.    Dispense:  20 capsule    Refill:  0  .  pantoprazole (PROTONIX) 40 MG tablet    Sig: Take 1 tablet (40 mg total) by mouth daily.    Dispense:  90 tablet    Refill:  1  . ibuprofen (ADVIL) 200 MG tablet    Sig: Take 2 tablets (400 mg total) by mouth every 12 (twelve) hours as needed.    Dispense:  100 tablet    Refill:  2     Chevis Pretty, MD

## 2020-12-28 NOTE — Patient Instructions (Signed)
Thank you for allowing [place] to provide your care today.  For your headache you can take Ibuprofen 400 mg as needed up to twice a day.Take pantoprazole with thatContinue Acetazolamide and follow with your neurologist. .   I have ordered ultrasound of knee. I will call if any are abnormal.    Today we made no other changes to your medications.   Make sure to follow up with neurology. We send referral to ophthalmology and rheumatology. Please follow-up in clinic in 1 month or sooner.  Should you have any questions or concerns please call the internal medicine clinic at [phone].

## 2020-12-30 ENCOUNTER — Encounter: Payer: Self-pay | Admitting: Internal Medicine

## 2020-12-30 ENCOUNTER — Telehealth: Payer: Self-pay | Admitting: Neurology

## 2020-12-30 DIAGNOSIS — M7989 Other specified soft tissue disorders: Secondary | ICD-10-CM | POA: Insufficient documentation

## 2020-12-30 DIAGNOSIS — G932 Benign intracranial hypertension: Secondary | ICD-10-CM | POA: Insufficient documentation

## 2020-12-30 NOTE — Assessment & Plan Note (Addendum)
Patient was diagnosed with IHH recently, with increased opening pressure on LP, after was referred to neurologist for headache and partially empty sella on head imaging. He has started Acetazolamide (started about 2 weeks ago) but he states he still has the headache. No new symptoms. No N/V, no vision change or new neurologic symptoms. We talked about other pharmacologic agents such as Topamax. He agrees that we defer this to his neurologist. He has an upcoming appointment. Meanwhile, he can take Ibuprofen PRN for headache and will let us or his neurologist know if any change in his symptoms. -Continue Acetazolamide 500 mg BID -F/u w neurology. Appreciate recommendations -Ibuprofen 400 mg  BID PRN. Instructed to take Pantoprazole 40 mg QD with that -Placing ambulatory referral to  ophthalmology exam for peripheral vision analysis

## 2020-12-30 NOTE — Telephone Encounter (Signed)
I received the MR venogram report.  Patient had a MRV of the head without IV contrast through [facility] on 12/27/2020: Impression: The dural sinuses appear patent by noncontrast examination.  Partially empty and expanded appearance of the sella.  Findings suggestive of a small incidental arachnoid cyst along the medial left temporal lobe.  Please call patient or advise him via MyChart message that his MR study of the veins of the brain shows no abnormalities affecting the drainage system of the brain blood vessels and no new findings.

## 2020-12-30 NOTE — Assessment & Plan Note (Signed)
Patient presented with 1 month Hx of lump on back of left knee that bothers him sometime. 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  The lump on back of his knee, is mildly tender. It does not appear to be baker cyst or thrombophlebitis on exam. Will do soft tissue ultrasound of posteromedial aspect of left knee.

## 2020-12-30 NOTE — Assessment & Plan Note (Addendum)
Patient asks for a referral to dermatologist for scaling of scalp and dry skin. Although it can be seborrheic dermatitis, given Hx of dermatomyositis, agree with referring him to derm as well. He is on Azathioprine and Prednisone for dermatomyositis.

## 2021-01-03 NOTE — Progress Notes (Signed)
Internal Medicine Clinic Attending  I saw and evaluated the patient.  I personally confirmed the key portions of the history and exam documented by Dr. Maryla Morrow and I reviewed pertinent patient test results.  The assessment, diagnosis, and plan were formulated together and I agree with the documentation in the resident's note. 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  If ultrasound doesn't provide clarification, a plain film might be appropriate.

## 2021-01-03 NOTE — Telephone Encounter (Signed)
I have reached out to the pt and advised of results via my chart. I asked the pt to let is know if he had any questions/concerns.

## 2021-01-10 ENCOUNTER — Ambulatory Visit: Payer: Self-pay | Admitting: Adult Health

## 2021-01-10 ENCOUNTER — Encounter: Payer: Self-pay | Admitting: Adult Health

## 2021-01-11 ENCOUNTER — Other Ambulatory Visit: Payer: Self-pay | Admitting: Internal Medicine

## 2021-01-11 DIAGNOSIS — G932 Benign intracranial hypertension: Secondary | ICD-10-CM

## 2021-01-17 ENCOUNTER — Telehealth: Payer: Self-pay | Admitting: Neurology

## 2021-01-17 NOTE — Telephone Encounter (Signed)
Pt. wants to discuss his intense headaches with RN. Please advise.

## 2021-01-17 NOTE — Telephone Encounter (Signed)
I called pt back , see messages from 01/17/21 on my chart for reference as well.  Pt complains of worsening H/a. Was scheduled for appt with NP last week but pt no showed. Pt has been rescheduled for tomorrow at 1030 with Dr. Frances Furbish. No NP availability at the time of appt schedule. PT is going to let his wife know about the appt and will CB if there is any conflicts.

## 2021-01-18 ENCOUNTER — Encounter: Payer: Self-pay | Admitting: Neurology

## 2021-01-18 ENCOUNTER — Ambulatory Visit (INDEPENDENT_AMBULATORY_CARE_PROVIDER_SITE_OTHER): Payer: 59 | Admitting: Neurology

## 2021-01-18 ENCOUNTER — Other Ambulatory Visit: Payer: Self-pay | Admitting: Internal Medicine

## 2021-01-18 ENCOUNTER — Other Ambulatory Visit: Payer: Self-pay | Admitting: Student

## 2021-01-18 VITALS — BP 122/80 | HR 91 | Ht 72.0 in | Wt 228.0 lb

## 2021-01-18 DIAGNOSIS — G932 Benign intracranial hypertension: Secondary | ICD-10-CM

## 2021-01-18 DIAGNOSIS — R519 Headache, unspecified: Secondary | ICD-10-CM

## 2021-01-18 DIAGNOSIS — G479 Sleep disorder, unspecified: Secondary | ICD-10-CM | POA: Diagnosis not present

## 2021-01-18 DIAGNOSIS — I10 Essential (primary) hypertension: Secondary | ICD-10-CM

## 2021-01-18 DIAGNOSIS — R0683 Snoring: Secondary | ICD-10-CM

## 2021-01-18 NOTE — Patient Instructions (Addendum)
I would like for you to continue with the Diamox.  Please try to be consistent with taking 500 mg strength 1 pill twice daily.  I doubt that this is exacerbating your headaches, if anything this is to help with your headaches.  In addition, we will pursue your sleep study this week.  Please keep your appointment for your sleep study on Thursday night.  Also keep your appointment to see Dr. Dione Booze on Friday this week.  I am glad to hear that there is no obvious swelling of your eye nerves.  It is important that you keep regular follow-up appointments with your eye doctor to the continue to monitor your eye exam.  If need be, we can increase her Diamox but we will wait out our next test results.  If you have underlying obstructive sleep apnea, as discussed, I would recommend treatment with a so-called CPAP or AutoPap machine.  We will call you with your results to keep you posted. Please keep your appointment in this clinic to see Butch Penny, nurse practitioner on 05/16/2021.  Wean may consider changing your appointment according to what we find on your sleep study.

## 2021-01-18 NOTE — Progress Notes (Signed)
Subjective:    Patient ID: Andrew Byrd is a 40 y.o. male.  HPI     Interim history:   [name] is a 40 year old right-handed gentleman with an underlying medical history of hypertension, vitamin D deficiency, reflux disease, dermatomyositis, on prednisone and Imuran, and obesity, who Presents for follow-up consultation of his headaches in the context of a recent diagnosis of pseudotumor cerebri.  The patient is unaccompanied today.  I first met him on 12/02/2020 at the request of his primary care physician, at which time he reported a 2-1/73-monthhistory of recurrent left-sided headaches.  A recent head CT without contrast had shown an incidental finding of partially empty sella.  A subsequent brain MRI without contrast on 11/25/2020 showed an incidental finding of a middle cranial fossa arachnoid cyst.  We talked about causes of recurrent headaches.  He was advised to proceed with evaluation with his ophthalmologist for a full, dilated eye exam and to investigate visual field deficits and papilledema.  He was advised to proceed with a lumbar puncture and MR venogram, as well as a sleep study to evaluate for underlying obstructive sleep apnea.   His fluoroscopic guided lumbar puncture from 12/14/2020 showed an opening pressure of 32.5, closing pressure after 24 mL of fluid removal was 12.5 cm.  Routine testing on the spinal fluid was benign.  He was called with the test results and advised to start Diamox.  He was somewhat hesitant to start Diamox but eventually started this.  He was also advised to keep his rheumatologist posted.   He was contacted for sleep study scheduling but has not scheduled his sleep study yet.  He has been followed by rheumatology.  He has an appointment for an eye exam with Dr. GKaty Byrd his ophthalmologist this week.    He emailed in the interim reporting ongoing headaches.  Patient had a MRV of the head without IV contrast through [facility] on 12/27/2020: Impression: The  dural sinuses appear patent by noncontrast examination.  Partially empty and expanded appearance of the sella.  Findings suggestive of a small incidental arachnoid cyst along the medial left temporal lobe.   I received an office note from Andrew Byrd patient saw Andrew Byrd 12/21/2020.  In summary, patient was not noted to have any gross edema of the optic nerves, possible nasal edema of optic nerves but no blind spot enlargement on visual field testing.  1 month follow-up with repeat visual field testing and OCT was recommended.  The patient's allergies, current medications, family history, past medical history, past social history, past surgical history and problem list were reviewed and updated as appropriate.   Today, 01/18/2021: He reports feeling about the same, headaches tends to be worse at night, he wakes up in the middle of the night with headaches and in the mornings.  He has tried ibuprofen but he feels that it is too strong for him, he ran out of his Protonix and has not requested a refill.  He was not sure who was prescribing this.  He has been taking the Diamox but admits that he does not always take it twice daily.  He fairly consistently takes the morning dose.  He wonders if it makes him sleepy during the day.  Sometimes he skips the evening dose because he feels that the headaches get worse from it.  He had an appointment with Dr. GZenia Byrd on 12/21/2020 and has his follow-up pending for this week.  He was not found to  have any gross edema thankfully.  He has not had any new symptoms.  He does not sleep very well.  He feels tired during the day.  He has not had any medication changes.  He continues to follow with rheumatology and also sees hematology.    Previously:   12/02/20: (He) reports an approximately 2-1/31-monthhistory of recurrent left-sided headaches.  The headache is in the left parietal area, fairly constant in nature, sometimes associated with nausea and  photophobia, sometimes he has jolts of pain briefly.  He does have a history of migraines growing up but they were infrequent and this headache feels different, more like a pressure, feels almost like a sinus headache perpetually.   I reviewed your office note from 10/13/2020. He reported a several week history of headaches including nocturnal headaches. You ordered a head CT with and without contrast. He had this on 10/13/2020 and I reviewed the results:  IMPRESSION: No acute intracranial abnormality.  No mass or abnormal enhancement.   Partially empty sella. This is a nonspecific finding that can be seen in the setting of idiopathic intracranial hypertension.    He had a subsequent brain MRI without contrast on 11/25/2020 and I reviewed the results: IMPRESSION: 2.1 cm left middle cranial fossa arachnoid cyst. No acute intracranial process. Mildly expanded, partially empty sella is suspicious for idiopathic intracranial hypertension. Consider LP with opening and closing pressures for further evaluation.   He sees his ophthalmologist typically on a regular basis but missed an appointment in December, he sees Dr. GKaty Byrd  He sees his rheumatologist on a regular basis, he is currently on prednisone 5 mg strength and was taken off of his Imuran in December secondary to low white cell count. 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   He has had intermittent chills, he has been tested for Covid repeatedly in the past but not very recently.  He reports that he had a fever recently of 101 but it did not stay.  He is scheduled for his first dose of Covid vaccine tomorrow.  His Past Medical History Is Significant For: Past Medical History:  Diagnosis Date  . [place]atomy[place]itis [place]   . Headache   . Hypertension     His Past Surgical History Is Significant For: Past Surgical History:  Procedure Laterality Date  . MUSCLE BIOPSY     thigh, benign    His Family History Is  Significant For: Family History  Problem Relation Age of Onset  . Hypertension Other   . Cancer Mother     His Social History Is Significant For: Social History   Socioeconomic History  . Marital status: Married    Spouse name: ORandell Patient . Number of children: 6  . Years of education: Not on file  . Highest education level: GED or equivalent  Occupational History    Comment: NA, disabled  Tobacco Use  . Smoking status: Never Smoker  . Smokeless tobacco: Never Used  . Tobacco comment: 1 per day   Vaping Use  . Vaping Use: Never used  Substance and Sexual Activity  . Alcohol use: No  . Drug use: Yes    Types: Marijuana    Comment: 12/02/20 3 x week-appetite  . Sexual activity: Yes    Partners: Female  Other Topics Concern  . Not on file  Social History Narrative   Lives with wife, children   Caffeine, sodas/tea- all day   Social Determinants of HRadio broadcast assistant  Strain: Not on file  Food Insecurity: Not on file  Transportation Needs: Not on file  Physical Activity: Not on file  Stress: Not on file  Social Connections: Not on file    His Allergies Are:  Allergies  Allergen Reactions  . Other Anaphylaxis and Swelling    Nut Allergy..Throat swelling.  . Anakinra Other (See Comments)    Mild transaminitis  . Penicillins Other (See Comments)    UNKNOWN REACTION  :   His Current Medications Are:  Outpatient Encounter Medications as of 01/18/2021  Medication Sig  . acetaZOLAMIDE (DIAMOX) 500 MG capsule Take 1 capsule (500 mg total) by mouth 2 (two) times daily. Initial titration: 1 pill nightly at bedtime for 1 week, then 1 pill twice daily thereafter. For refills: 1 pill twice daily  . azaTHIOprine (IMURAN) 50 MG tablet Take 50 mg by mouth 2 (two) times daily.  . ergocalciferol (VITAMIN D2) 1.25 MG (50000 UT) capsule Take 1 capsule (50,000 Units total) by mouth once a week.  Marland Kitchen ibuprofen (ADVIL) 200 MG tablet Take 2 tablets (400 mg total) by mouth every 12  (twelve) hours as needed.  . pantoprazole (PROTONIX) 40 MG tablet Take 1 tablet (40 mg total) by mouth daily.  . predniSONE (DELTASONE) 5 MG tablet TAKE 1 TABLET BY MOUTH EVERY DAY  . pyridOXINE (VITAMIN B-6) 100 MG tablet Take 100 mg by mouth daily.  . [DISCONTINUED] amLODipine (NORVASC) 5 MG tablet TAKE 1 TABLET BY MOUTH EVERY DAY  . [DISCONTINUED] losartan (COZAAR) 50 MG tablet Take 1 tablet (50 mg total) by mouth daily.   No facility-administered encounter medications on file as of 01/18/2021.  :  Review of Systems:  Out of a complete 14 point review of systems, all are reviewed and negative with the exception of these symptoms as listed below:  Review of Systems  Neurological:       Pt here to f/u on headaches. Pt reports is h/a have not improved since starting the diamox. Reports he thinks the medication is actually increasing his headaches. He is taking tylenol when h/a when pain is too much and feels like it helps some.  He is scheduled to see Dr. Zenia Resides office in f/u on 01/21/21. Pt has not had his sleep study yet- reports he is scheduled to have sleep study this coming Thursday.    Objective:  Neurological Exam  Physical Exam Physical Examination:   Vitals:   01/18/21 1026  BP: 122/80  Pulse: 91  SpO2: 98%    General Examination: The patient is a very pleasant 40 y.o. male in no acute distress. He appears well-developed and well-nourished and well groomed.   HEENT: Normocephalic, atraumatic, pupils are equal, round and reactive to light.  No photophobia. Hearing is grossly intact. Mild erythema around the forehead, periorbitally bilaterally, and a malar rash, mild swelling around upper eyelids bilaterally, slightly better.  Airway examination reveals mild airway crowding.  Tongue protrudes centrally and palate elevates symmetrically.  Neck is supple with full range of motion.  No carotid bruits.  Chest: Clear to auscultation without wheezing, rhonchi or crackles  noted.  Heart: S1+S2+0, regular and normal without murmurs, rubs or gallops noted.   Abdomen: Soft, non-tender and non-distended.  Extremities: There is no pitting edema in the distal lower extremities bilaterally.  Skin: Warm and dry without trophic changes noted.  Musculoskeletal: exam reveals tenderness and pain right shoulder area, with decreased range of motion in the right shoulder joint, bilateral knee pain.  Neurologically:  Mental status: The patient is awake, alert and oriented in all 4 spheres. His immediate and remote memory, attention, language skills and fund of knowledge are appropriate. There is no evidence of aphasia, agnosia, apraxia or anomia. Speech is clear with normal prosody and enunciation. Thought process is linear. Mood is normal and affect is normal.  Cranial nerves II - XII are as described above under HEENT exam.  Motor exam: Normal bulk, strength and tone is noted today. Reflexes are 2+ throughout. Fine motor skills and coordination: intact grossly.  Cerebellar testing: No dysmetria or intention tremor. There is no truncal or gait ataxia.  Sensory exam: intact to light touch.  Gait, station and balance: He stands with mild difficulty, he has some aching in the knees particularly.  He walks without shuffling, preserved arm swing is noted, no obvious limp.   Assessment and Plan:  In summary, Keelen Quevedo is a very pleasant 40 year old male with an underlying medical history of hypertension, vitamin D deficiency, reflux disease, dermatomyositis, on prednisone and Imuran, and borderline obesity, who presents for evaluation of his headache disorder. While he does have a history of migraines in the past, his recent onset headache on the left side is different from his typical migraines in the past.  He had a CT and a brain MRI in the recent past and was found to have an arachnoid cyst which is typically benign and congenital.  Evaluation for intracranial  hypertension revealed elevated spinal fluid pressure during his fluoroscopic guided lumbar puncture.  Benign test results on CSF testing overall.  He has been started on Diamox.  He had some reluctance to starting a new medication but is on it at this time, not always consistent with 500 mg twice daily.  I explained to him that headaches from Diamox are not a common side effect, in fact, it is supposed to help his headaches.  He is advised to continue with the Diamox twice daily consistently.  He has seen his eye doctor on 12/21/2020 at which time he was not found to have any obvious papilledema.  He has an appointment pending for 01/21/2021.  He also is now scheduled for a sleep study in 2 days from now.  He is advised to keep all his appointments and continue with his current medications.  He ran out of his Protonix and is advised to request a refill from his primary care. His MR venogram did not show any significant stenoses. If he has obstructive sleep apnea, I will likely suggest treatment in the form of CPAP or AutoPap therapy.  He is advised to keep his appointment in this office as scheduled for June, we will change his follow-up according to his next eye checkup, we may decide to increase his Diamox also if need be.  We may change his appointment also if the need arises depending on what we find on his sleep test.  I answered all his questions today and he was in agreement.

## 2021-01-19 ENCOUNTER — Other Ambulatory Visit: Payer: Self-pay | Admitting: Internal Medicine

## 2021-01-19 DIAGNOSIS — G932 Benign intracranial hypertension: Secondary | ICD-10-CM

## 2021-01-20 ENCOUNTER — Ambulatory Visit (INDEPENDENT_AMBULATORY_CARE_PROVIDER_SITE_OTHER): Payer: 59 | Admitting: Neurology

## 2021-01-20 ENCOUNTER — Other Ambulatory Visit: Payer: Self-pay

## 2021-01-20 DIAGNOSIS — R634 Abnormal weight loss: Secondary | ICD-10-CM

## 2021-01-20 DIAGNOSIS — H538 Other visual disturbances: Secondary | ICD-10-CM

## 2021-01-20 DIAGNOSIS — M3390 Dermatopolymyositis, unspecified, organ involvement unspecified: Secondary | ICD-10-CM

## 2021-01-20 DIAGNOSIS — E236 Other disorders of pituitary gland: Secondary | ICD-10-CM

## 2021-01-20 DIAGNOSIS — E669 Obesity, unspecified: Secondary | ICD-10-CM

## 2021-01-20 DIAGNOSIS — R351 Nocturia: Secondary | ICD-10-CM

## 2021-01-20 DIAGNOSIS — R0683 Snoring: Secondary | ICD-10-CM

## 2021-01-20 DIAGNOSIS — G932 Benign intracranial hypertension: Secondary | ICD-10-CM

## 2021-01-20 DIAGNOSIS — G930 Cerebral cysts: Secondary | ICD-10-CM

## 2021-01-20 DIAGNOSIS — G4720 Circadian rhythm sleep disorder, unspecified type: Secondary | ICD-10-CM

## 2021-01-20 DIAGNOSIS — G479 Sleep disorder, unspecified: Secondary | ICD-10-CM

## 2021-01-20 DIAGNOSIS — R519 Headache, unspecified: Secondary | ICD-10-CM

## 2021-01-26 NOTE — Progress Notes (Signed)
Patient had a PSG on 01/20/21 with Hx of IIH.    Please call and notify the patient that the recent sleep study did not show any significant obstructive sleep apnea. He had mild snoring. Please inform patient that he can FU with [place] as scheduled. Thanks,  Huston Foley, MD, PhD [facility])

## 2021-01-26 NOTE — Procedures (Signed)
PATIENT'S NAME:  Andrew Byrd, Andrew Byrd DOB:      04-18-1981      MR#:    [id number]     DATE OF RECORDING: 01/20/2021 REFERRING M.D.:  Tyson Alias, MD Study Performed:   Baseline Polysomnogram HISTORY: 40 year old man with a history of hypertension, vitamin D deficiency, reflux disease, dermatomyositis, on prednisone and Imuran, and obesity, who reports an approximately 2-1/1-month history of recurrent left-sided headaches. He was diagnosed with intracranial hypertension. He presents for evaluation of underlying sleep disordered breathin. The patient's weight 228 pounds with a height of 72 (inches), resulting in a BMI of 30.8 kg/m2.  CURRENT MEDICATIONS: Diamox, Imuran, Vitamin D2, Advil,Protonix, Deltasone, Vitamin B6   PROCEDURE:  This is a multichannel digital polysomnogram utilizing the Somnostar 11.2 system.  Electrodes and sensors were applied and monitored per AASM Specifications.   EEG, EOG, Chin and Limb EMG, were sampled at 200 Hz.  ECG, Snore and Nasal Pressure, Thermal Airflow, Respiratory Effort, CPAP Flow and Pressure, Oximetry was sampled at 50 Hz. Digital video and audio were recorded.      BASELINE STUDY  Lights Out was at 21:51 and Lights On at 04:49.  Total recording time (TRT) was 419 minutes, with a total sleep time (TST) of 300.5 minutes.   The patient's sleep latency was 85.5 minutes, which is delayed. REM latency was 78.5 minutes.  The sleep efficiency was 71.7 %.     SLEEP ARCHITECTURE: WASO (Wake after sleep onset) was 33.5 minutes with mild sleep fragmentation noted. There were 9 minutes in Stage N1, 193 minutes Stage N2, 44 minutes Stage N3 and 54.5 minutes in Stage REM.  The percentage of Stage N1 was 3.%, Stage N2 was 64.2%, which is increased, Stage N3 was 14.6% and Stage R (REM sleep) was 18.1%, which is near normal. The arousals were noted as: 29 were spontaneous, 0 were associated with PLMs, 0 were associated with respiratory events.  RESPIRATORY ANALYSIS:  There  were a total of 3 respiratory events:  0 obstructive apneas, 0 central apneas and 2 mixed apneas with a total of 2 apneas and an apnea index (AI) of .4 /hour. There were 1 hypopneas with a hypopnea index of .2 /hour. The patient also had 0 respiratory event related arousals (RERAs).      The total APNEA/HYPOPNEA INDEX (AHI) was .6/hour and the total RESPIRATORY DISTURBANCE INDEX was  .6 /hour.  3 events occurred in REM sleep and 0 events in NREM. The REM AHI was  3.3 /hour, versus a non-REM AHI of 0. The patient spent 144.5 minutes of total sleep time in the supine position and 156 minutes in non-supine.. The supine AHI was 0.4 versus a non-supine AHI of 0.8.  OXYGEN SATURATION & C02:  The Wake baseline 02 saturation was 97%, with the lowest being 88% (84% on technical report was due to an error). Time spent below 89% saturation equaled 2 minutes.  PERIODIC LIMB MOVEMENTS: The patient had a total of 0 Periodic Limb Movements.  The Periodic Limb Movement (PLM) index was 0 and the PLM Arousal index was 0/hour.  Audio and video analysis did not show any abnormal or unusual movements, behaviors, phonations or vocalizations. The patient took bathroom 1 break. Mild snoring was noted. The EKG was in keeping with normal sinus rhythm (NSR).  Post-study, the patient indicated that sleep was the same as usual.   IMPRESSION:  1. Primary Snoring 2. Dysfunctions associated with sleep stages or arousal from sleep  RECOMMENDATIONS:  1.  This study does not demonstrate any significant obstructive or central sleep disordered breathing with the exception of mild snoring.  2. This study shows sleep fragmentation and abnormal sleep stage percentages; these are nonspecific findings and per se do not signify an intrinsic sleep disorder or a cause for the patient's sleep-related symptoms. Causes include (but are not limited to) the first night effect of the sleep study, circadian rhythm disturbances, medication effect or  an underlying mood disorder or medical problem.  3. The patient should be cautioned not to drive, work at heights, or operate dangerous or heavy equipment when tired or sleepy. Review and reiteration of good sleep hygiene measures should be pursued with any patient. 4. The patient will be notified of the test results and seen in follow-up at [facility] as scheduled.   I certify that I have reviewed the entire raw data recording prior to the issuance of this report in accordance with the Standards of Accreditation of the American Academy of Sleep Medicine (AASM)   Huston Foley, MD, PhD Diplomat, American Board of Neurology and Sleep Medicine (Neurology and Sleep Medicine)

## 2021-01-28 ENCOUNTER — Ambulatory Visit: Payer: 59 | Admitting: Neurology

## 2021-02-02 ENCOUNTER — Telehealth: Payer: Self-pay | Admitting: Neurology

## 2021-02-02 DIAGNOSIS — R519 Headache, unspecified: Secondary | ICD-10-CM

## 2021-02-02 DIAGNOSIS — G932 Benign intracranial hypertension: Secondary | ICD-10-CM

## 2021-02-02 NOTE — Telephone Encounter (Signed)
Please see my chart message for reference.  Please call patient and advise patient that I would not recommend that he come off any medication abruptly.  Can you find out how long he has been off of the Diamox? We can try him on Topamax, we will start with 50 mg once daily and increase to weekly increments to 100 mg twice daily.  I would also like to suggest evaluation through a specialist, namely a neuro-ophthalmologist.  I would like to make a referral to Dr. Gentry Roch at [facility].  I think it would be helpful if we get specialist input for managing patient's headache and elevated spinal fluid pressure.  Please let me know if he is agreeable to starting Topamax and a referral to Dr. Daphine Deutscher.

## 2021-02-02 NOTE — Addendum Note (Signed)
Addended by: Neomia Dear on: 02/02/2021 03:03 PM   Modules accepted: Orders

## 2021-02-02 NOTE — Telephone Encounter (Signed)
I have reached out to the pt via my chart to see when he stopped the diamox. Will update him on recommendations once I hear back.

## 2021-02-03 ENCOUNTER — Telehealth: Payer: Self-pay | Admitting: Neurology

## 2021-02-03 MED ORDER — TOPIRAMATE 50 MG PO TABS: 100.0000 mg | ORAL_TABLET | Freq: Two times a day (BID) | ORAL | 2 refills | Status: DC

## 2021-02-03 NOTE — Addendum Note (Signed)
Addended by: Ann Maki on: 02/03/2021 08:04 AM   Modules accepted: Orders

## 2021-02-03 NOTE — Telephone Encounter (Signed)
Dr. Frances Furbish and Aundra Millet   Please advise . [facility] doesn't take his insurance . [facility] does I can get him sent there . Is this ok ? Please advise I will call patient and make him him aware of details . Thanks Annabelle Harman

## 2021-02-03 NOTE — Telephone Encounter (Signed)
Rx for topiramate sent.

## 2021-02-03 NOTE — Telephone Encounter (Signed)
Dana: Thank you for helping with the referral, I would appreciate referral to neuro-ophthalmology at [facility] if possible.

## 2021-02-03 NOTE — Telephone Encounter (Addendum)
Pt is agreeable to both the Topamax and the referral to Dr. Daphine Deutscher at [facility].  I spoke with Dr. Frances Furbish verbally on this and she recommends Topamax 50 mg Week 1- 1 tablet at bedtime Week 2- 2 tablts at bedtime Week 3- 1 tablet in the morning 2 at bedtime  Week 4- 2 tablets in the morning and 2 at bedtime( this will be the maintenance dosage)   I called pt and advised of the tapering process verbally, pt requested I send this information to him via my chart. I have sent. Pt advised on the common s/e ( dizziness/tiredness and also advised to review s/e with pharmacist at the time of pick up)

## 2021-02-09 NOTE — Telephone Encounter (Signed)
I am still working on [employer] for patient I may have defer patient to call his plan to see who he is net work with

## 2021-02-10 NOTE — Telephone Encounter (Signed)
MY - Chart message sent  And I left patient a voice mail patient is going to have to call his insurance and see who he is in network with . Thanks Annabelle Harman .

## 2021-02-14 ENCOUNTER — Ambulatory Visit: Payer: Self-pay | Admitting: Neurology

## 2021-02-14 ENCOUNTER — Encounter: Payer: 59 | Admitting: Internal Medicine

## 2021-02-15 ENCOUNTER — Encounter: Payer: Self-pay | Admitting: Internal Medicine

## 2021-02-15 ENCOUNTER — Other Ambulatory Visit: Payer: Self-pay

## 2021-02-15 ENCOUNTER — Ambulatory Visit (INDEPENDENT_AMBULATORY_CARE_PROVIDER_SITE_OTHER): Payer: 59 | Admitting: Internal Medicine

## 2021-02-15 VITALS — BP 132/92 | HR 71 | Temp 98.2°F | Ht 72.0 in | Wt 243.1 lb

## 2021-02-15 DIAGNOSIS — M3390 Dermatopolymyositis, unspecified, organ involvement unspecified: Secondary | ICD-10-CM | POA: Diagnosis not present

## 2021-02-15 DIAGNOSIS — D61818 Other pancytopenia: Secondary | ICD-10-CM

## 2021-02-15 DIAGNOSIS — R531 Weakness: Secondary | ICD-10-CM

## 2021-02-15 DIAGNOSIS — I10 Essential (primary) hypertension: Secondary | ICD-10-CM | POA: Diagnosis not present

## 2021-02-15 DIAGNOSIS — M7989 Other specified soft tissue disorders: Secondary | ICD-10-CM | POA: Diagnosis not present

## 2021-02-15 DIAGNOSIS — E559 Vitamin D deficiency, unspecified: Secondary | ICD-10-CM

## 2021-02-15 DIAGNOSIS — R600 Localized edema: Secondary | ICD-10-CM | POA: Diagnosis not present

## 2021-02-15 DIAGNOSIS — L219 Seborrheic dermatitis, unspecified: Secondary | ICD-10-CM

## 2021-02-15 DIAGNOSIS — G932 Benign intracranial hypertension: Secondary | ICD-10-CM | POA: Diagnosis not present

## 2021-02-15 DIAGNOSIS — R5381 Other malaise: Secondary | ICD-10-CM | POA: Diagnosis not present

## 2021-02-15 MED ORDER — AMLODIPINE BESYLATE 5 MG PO TABS: 5.0000 mg | ORAL_TABLET | Freq: Every day | ORAL | 1 refills | Status: DC

## 2021-02-15 MED ORDER — LOSARTAN POTASSIUM 50 MG PO TABS: 50.0000 mg | ORAL_TABLET | Freq: Every day | ORAL | 1 refills | Status: DC

## 2021-02-15 NOTE — Assessment & Plan Note (Signed)
Current medications: losartan 40mg , amlodipine 5mg  He notes that he ran out of the losartan a couple of weeks ago Blood pressure is mildly above goal in the office today BASIC VITALS 02/15/2021 02/15/2021 02/15/2021 01/18/2021 12/28/2020  BP 132/92 134/92 136/92 122/80 118/75   Plan -refills for his losartan and amlodipine sent to his pharmacy. He understands to call 01/20/2021 if he runs out in the future and is unable to obtain a refill from the pharmacy -will f/u the renal function on care everywhere from his labs he will get drawn in [facility] -continue current management for now. He will check his blood pressures at home and let us know if they remain elevated after restarting the losartan

## 2021-02-15 NOTE — Assessment & Plan Note (Addendum)
Interm history update: pt was not taking the diamox due to thoughts that it was making his headache worse. He was switched to topamax by neurology and started it about a week ago. He feels that his headaches are somewhat improved. He will be going to [facility] neuro ophthalmology for a consultation in the near future.

## 2021-02-15 NOTE — Assessment & Plan Note (Addendum)
This was an initial encounter for evaluation of 2w of bilateral lower extremity edema that occurred following running out of his antihypertensives. Ddx includes cardiac etiologies vs venous insufficency vs renal  On exam, he has trace edema in the lower extremities but no other signs of volume overload. Wt is up 228>243lb since 2/22 which is a month ago however question the accuracy. He does not have a known history of structural heart disease and I do not appreciate any murmurs however I do think that a change in cardiac function needs to be ruled out given the complexity of medical conditions. I did consider a correlation between running out of his medications and timing of symptom onset however amlodipine and losartan should not be playing a large role in his volume status without there being some other kind of underlying etiology. The bilaterality of the swelling makes VTE less likely. I have also considered the possibility of a renal cause or a protein losing nephropathy. Again, this is not something that I am aware of him having in the past however would keep it in the differential.  In regards to the facial edema that he is concerned about, I do not appreciate signs of angioedema or other life threatening findings on exam. He has experienced intermittent facial swelling in the past which self resolves. It may be attributable to prednisone.  Plan -will obtain an echocardiogram to r/o structural disease as being a cause  -will obtain a urine protein as well. He will stop by the clinic in the morning as he is unable to provide this today -I would also like to obtain a CMP on him to make sure there hasn't been a significant change in his serum protein levels or any other derangement that could account for this. 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 He is agreeable to going and getting these done  today to avoid an extra needle stick in our clinic today -encouraged him to continue the compression stockings for now

## 2021-02-15 NOTE — Assessment & Plan Note (Signed)
Mobility has significantly improved from last year. He was able to walk down to the clinic this morning without the use of a wheelchair. Will resolve this issue.

## 2021-02-15 NOTE — Patient Instructions (Addendum)
I have resent your medications to the pharmacy. Please don't hesitate to reach out if you are unable to get them. I would also like you to have labs drawn as you had previously planned. Please try to do this as soon as you can. I have also ordered an echocardiogram to make sure your heart is not involved with the lower leg swelling.

## 2021-02-15 NOTE — Assessment & Plan Note (Addendum)
Current medications: imuran 142m daily  [facility]  Interm history:  December 2021: f/u with rheumatology at which time imuran was increased to 1545mdaily. Labs obtained from that visit showed a new leukopenia. Imuran was held 12/26-1/7 and he was referred to hematology. Jan 2022: CRP 7, ESR 96 -Rheumatology is ordering CBC and CMP q3 months to monitor for drug toxicity.  -Next f/u appointment with Rheumatology should be in April

## 2021-02-15 NOTE — Progress Notes (Signed)
Acute Office Visit   Patient ID: Andrew Byrd, male    DOB: 1981-10-05, 40 y.o.   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   He notes that his lower extremities began to swell following running out of his blood pressure medications a couple of weeks ago. Symptoms worsen when he is on his feet for extended periods of time. He denies chest pain, orthopnea, change in urinary habits, or dietary changes. He does endorse some shortness of breath that has been present for several months but has not changed acutely or congruently with the lower extremity swelling. He has started wearing compressing stockings which he says has largely improved his swelling. Denies prior similar symptoms.   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 This was supported by elevated SIL2R, low NK cell level, hepatomegaly, elevated HScore (reaching 88%), proximal muscle weakness (neck flexors/extensors and proximal hip and shoulder flexors/extensors), rash with interface dermatitis on skin biopsy, MRI findings suggestive of inflammatory myositis, and dysphagia on speech eval. Flow cytometry did not reveal monoclonal B cell population or aberrant T cell population. He underwent pulse dose steroids, oral prednisone, IVIG, 2 doses of anakinra and was discharged on prednisone taper and imuran. His hospitalization was complicated by DILI.  06/2020 rheumatology f/u:  -imuran 139m daily continued.  -Prednisone tapered off  11/18/2020 rheumatology f/u:  -imuran increased to  1592m 11/21/20  -referred to hematology for leukopenia despite being off imuran -imuran held  12/16/20 hematology eval -Ferritin 1125, iron 69, TIBC 315, %sat 22 -WBC 1.6, hgb 10.3, Plt 203 -Folate 303, B12 131, MMA 101, ESR 93, CRP  7, LDH 172 -CMP unremarkable -T cell receptor gene rearrangement and flow cytometry unable to be collected at the particular site that he went to -he was instructed to follow up after completion of labs  Today, in the office, I discussed with him that I would like to get some labs to evaluation his renal function. He notes that he still needs to get some labs done for the hematologist.    Abdominal and LE nodules 9/7 abdominal [place]): multiple solid inhomogenous hypoechoic nodular lesions in the lower abdominal wall that are primarily in the fatty tissue. They show a degree of blood flow. They are non-calcified and do not have the appearance of a typical lipoma. Largest nodule measures 1.6x1.3x1.3cm. 08/2020: FNA and core biopsy results reviewed showing fibroadipose tissue with fat necrosis and dystrophic calcification.  10/2020 rheum f/u: -plan to repeat CT imaging in 6 mo  [facility] that was [facility] -[facility] (scheduled 3/24)   Idiopathic Intracranial Hypertension  10/13/20 [facility]: presented with 3w of gradual onset intermittent left sided headache for which head CT was ordered 10/22/20 head CT showed a partially empty sella. Brain MRI ordered. 12/10 he still had not undergone the MRI. Referred to neurology 12/30 brain MRI: 2.1cm left temporal arachnoid cyst, partially empty sella concerning for possible idiopathic intracranial hypertension  12/02/20 neurology OV:  1/18 LP:  elevated opening pressure of 32.5. He was started on diamox 1/25 ophthalmology eval:  No gross optic nerve edema. Instructed to  f/u for repeat testing in 1 mo 1/31 MR venogram. Vascular unremarkable  01/18/21 neurology  OV -continue diamox 545m BID  2/24 sleep study: no evidence of significant central or obstructive sleep apnea.  3/2: pt notified neurology that he discontinued the diamox as he felt they were making his headaches worse -topamax started -referred to neuroopthalmology   Today, in the office, he notes that his headaches have improved since starting the topamax. He will be following up with [facility] neuro ophthalmology         ACTIVE MEDICATIONS   Outpatient Medications Prior to Visit  Medication Sig Dispense Refill  . azaTHIOprine (IMURAN) 50 MG tablet Take 150 mg by mouth daily.    . Cholecalciferol 25 MCG (1000 UT) capsule Take 1,000 Units by mouth daily.    . cyanocobalamin 1000 MCG tablet Take 1,000 mcg by mouth daily.    . folic acid (FOLVITE) 1 MG tablet Take 1 tablet by mouth daily.    . hydrOXYzine (ATARAX/VISTARIL) 25 MG tablet Take 25 mg by mouth as needed.    . indomethacin (INDOCIN) 25 MG capsule Take 25 mg by mouth 2 (two) times daily as needed.    . pantoprazole (PROTONIX) 40 MG tablet Take 1 tablet (40 mg total) by mouth daily. 90 tablet 1  . topiramate (TOPAMAX) 50 MG tablet Take 2 tablets (100 mg total) by mouth 2 (two) times daily. Flow instructions given for taper 120 tablet 2  . amLODipine (NORVASC) 5 MG tablet TAKE 1 TABLET BY MOUTH EVERY DAY 90 tablet 1  . azaTHIOprine (IMURAN) 50 MG tablet Take 50 mg by mouth 2 (two) times daily.    . ergocalciferol (VITAMIN D2) 1.25 MG (50000 UT) capsule Take 1 capsule (50,000 Units total) by mouth once a week. 12 capsule 1  . folic acid (FOLVITE) 1 MG tablet TAKE 1 TABLET BY MOUTH EVERY DAY 90 tablet 1  . ibuprofen (ADVIL) 200 MG tablet Take 2 tablets (400 mg total) by mouth every 12 (twelve) hours as needed. 100 tablet 2  . losartan (COZAAR) 50 MG tablet TAKE 1 TABLET BY MOUTH EVERY DAY 90 tablet 1  . predniSONE (DELTASONE) 5 MG tablet TAKE 1 TABLET BY MOUTH EVERY DAY 30 tablet 1  . pyridOXINE (VITAMIN B-6) 100 MG tablet Take  100 mg by mouth daily.     No facility-administered medications prior to visit.     Objective:   BP (!) 132/92   Pulse 71   Temp 98.2 F (36.8 C) (Oral)   Ht 6' (1.829 m)   Wt 243 lb 1.6 oz (110.3 kg)   SpO2 100%   BMI 32.97 kg/m  Wt Readings from Last 3 Encounters:  02/15/21 243 lb 1.6 oz (110.3 kg)  01/18/21 228 lb (103.4 kg)  12/28/20 229 lb 3.2 oz (104 kg)   BP Readings from Last 3 Encounters:  02/15/21 (!) 132/92  01/18/21 122/80  12/28/20 118/75   Physical exam General: appears much better than the last time I saw him back in September HEENT: no swelling appreciated of the lips or tongue. The periorbital edema is very minimal and improved from my last visit with him. Cardiac: no JVD, RRR, no murmurs, trace LE edema that is equal bilaterally, extremities warm Pulm: breathing comfortably on room air. Lungs are clear to auscultation--no crackles  Health Maintenance:   Health Maintenance  Topic Date Due  . PNEUMOCOCCAL POLYSACCHARIDE VACCINE AGE 52-64 HIGH RISK  Never done  . OPHTHALMOLOGY EXAM  Never done  .  COVID-19 Vaccine (1) Never done  . TETANUS/TDAP  Never done  . INFLUENZA VACCINE  Never done  . Hepatitis C Screening  Completed  . HIV Screening  Completed  . HPV VACCINES  Aged Out  . FOOT EXAM  Discontinued  . HEMOGLOBIN A1C  Discontinued     Assessment & Plan:   Problem List Items Addressed This Visit      Cardiovascular and Mediastinum   Hypertension (Chronic)    Current medications: losartan 45m, amlodipine 533mHe notes that he ran out of the losartan a couple of weeks ago Blood pressure is mildly above goal in the office today BASIC VITALS 02/15/2021 02/15/2021 02/15/2021 01/18/2021 12/28/2020  BP 132/92 134/92 136/92 122/80 118/75   Plan -refills for his losartan and amlodipine sent to his pharmacy. He understands to call usKoreaf he runs out in the future and is unable to obtain a refill from the pharmacy -will f/u the renal function on care  everywhere from [facility] labs [facility]. He will check his blood pressures at home and let usKoreanow if they remain elevated after restarting the losartan       Relevant Medications   losartan (COZAAR) 50 MG tablet   amLODipine (NORVASC) 5 MG tablet   Other Relevant Orders   Microalbumin / Creatinine Urine Ratio   Protein,Total,Urine     Nervous and Auditory   Idiopathic intracranial hypertension (Chronic)    Interm history update: pt was not taking the diamox due to thoughts that it was making his headache worse. He was switched to topamax by neurology and started it about a week ago. He feels that his headaches are somewhat improved. [facility] near future.        Musculoskeletal and Integument   D[place]atomy[place]itis ([place])    Current medications: imuran 15023maily  [facility]:  [facility]: f/u with rheumatology at which time imuran was increased to 150m39mily. Labs obtained from that visit showed a new leukopenia. Imuran was held 12/26-1/7 and he was referred to hematology. Jan 2022: CRP 7, ESR 96 -Rheumatology is ordering CBC and CMP q3 months to monitor for drug toxicity.  -Next f/u appointment with Rheumatology should be in April        Hematopoietic and Hemostatic   Pancytopenia (HCC)Kathleen Interm history: [facility] 2021 for leukopenia (WBC 2.2).  He has undergone an extensive workup with them however has not had the flow cytometry drawn yet. He is encouraged to get this done.      Relevant Medications   cyanocobalamin 10008185 tablet   folic acid (FOLVITE) 1 MG tablet     Other   Nodule of soft tissue    [facility] planning on [facility] a CT in 6 months or so to evaluate for  changes.  He was evaluated in our office by my colleague in February for left posterior popliteal nodule so a LE ultrasound was ordered for evaluation. This is scheduled for 3/24.      Vitamin D deficiency    Current medications: Vitamin D 1000U daily Interm history: Follow up Vitamin D level was normal so he was transitioned from 50,000U weekly to 1000U daily.  Bilateral lower extremity edema - Primary    This was an initial encounter for evaluation of 2w of bilateral lower extremity edema that occurred following running out of his antihypertensives. Ddx includes cardiac etiologies vs venous insufficency vs renal  On exam, he has trace edema in the lower extremities but no other signs of volume overload. Wt is up 228>243lb since 2/22 which is a month ago however question the accuracy. He does not have a known history of structural heart disease and I do not appreciate any murmurs however I do think that a change in cardiac function needs to be ruled out given the complexity of medical conditions. I did consider a correlation between running out of his medications and timing of symptom onset however amlodipine and losartan should not be playing a large role in his volume status without there being some other kind of underlying etiology. The bilaterality of the swelling makes VTE less likely. I have also considered the possibility of a renal cause or a protein losing nephropathy. Again, this is not something that I am aware of him having in the past however would keep it in the differential.  In regards to the facial edema that he is concerned about, I do not appreciate signs of angioedema or other life threatening findings on exam. He has experienced intermittent facial swelling in the past which self resolves. It may be attributable to prednisone.  Plan -will obtain an echocardiogram to r/o structural disease as being a cause  -will obtain a urine protein as well. He will stop by the  clinic in the morning as he is unable to provide this today -I would also like to obtain a CMP on him to make sure there hasn't been a significant change in his serum protein levels or any other derangement that could account for this. 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 He is agreeable to going and getting these done today to avoid an extra needle stick in our clinic today -encouraged him to continue the compression stockings for now      Relevant Orders   ECHOCARDIOGRAM COMPLETE   RESOLVED: Physical deconditioning    Mobility has significantly improved from last year. He was able to walk down to the clinic this morning without the use of a wheelchair. Will resolve this issue.          Return in about 3 months (around 05/18/2021).   Pt discussed with Dr. Elwanda Brooklyn, MD Internal Medicine Resident PGY-2 [facility] Pager: [phone] 02/15/2021 6:38 PM

## 2021-02-15 NOTE — Assessment & Plan Note (Addendum)
This issue is also being followed by rheumatology at [facility] who was planning on repeating a CT in 6 months or so to evaluate for changes.  He was evaluated in our office by my colleague in February for left posterior popliteal nodule so a LE ultrasound was ordered for evaluation. This is scheduled for 3/24.

## 2021-02-15 NOTE — Assessment & Plan Note (Addendum)
Interm history: he was referred to [facility] hematology by his rheumatologist in December 2021 for leukopenia (WBC 2.2).  He has undergone an extensive workup with them however has not had the flow cytometry drawn yet. He is encouraged to get this done.

## 2021-02-15 NOTE — Assessment & Plan Note (Signed)
Current medications: Vitamin D 1000U daily Interm history: Follow up Vitamin D level was normal so he was transitioned from 50,000U weekly to 1000U daily.

## 2021-02-16 LAB — MICROALBUMIN / CREATININE URINE RATIO
Creatinine, Urine: 235.3 mg/dL
Microalb/Creat Ratio: 63 mg/g creat — ABNORMAL HIGH (ref 0–29)
Microalbumin, Urine: 149.4 ug/mL

## 2021-02-16 LAB — PROTEIN,TOTAL,URINE: Protein, Ur: 75.2 mg/dL

## 2021-02-16 NOTE — Progress Notes (Signed)
Internal Medicine Clinic Attending  Case discussed with Dr. Christian  At the time of the visit.  We reviewed the resident's history and exam and pertinent patient test results.  I agree with the assessment, diagnosis, and plan of care documented in the resident's note.  

## 2021-02-17 ENCOUNTER — Ambulatory Visit (HOSPITAL_COMMUNITY)
Admission: RE | Admit: 2021-02-17 | Discharge: 2021-02-17 | Disposition: A | Discharge status: Home / Self Care | Payer: 59 | Source: Ambulatory Visit | Attending: Internal Medicine | Admitting: Internal Medicine

## 2021-02-17 ENCOUNTER — Other Ambulatory Visit: Payer: Self-pay

## 2021-02-17 DIAGNOSIS — M7989 Other specified soft tissue disorders: Secondary | ICD-10-CM | POA: Insufficient documentation

## 2021-02-17 DIAGNOSIS — R229 Localized swelling, mass and lump, unspecified: Secondary | ICD-10-CM | POA: Insufficient documentation

## 2021-02-17 DIAGNOSIS — L219 Seborrheic dermatitis, unspecified: Secondary | ICD-10-CM | POA: Insufficient documentation

## 2021-02-17 NOTE — Assessment & Plan Note (Signed)
Follows with derm. LOV 3/23. Started on derma-smooth FS and cutivate.

## 2021-02-23 ENCOUNTER — Telehealth: Payer: Self-pay | Admitting: Neurology

## 2021-02-23 NOTE — Telephone Encounter (Signed)
I received an office visit note from Alma Downs, [place] with [facility], visit date 02/22/2021.  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  He was noted to have gross edema of the optic nerves on OCT.  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  FYI - nothing further needed.

## 2021-02-23 NOTE — Telephone Encounter (Signed)
Noted  

## 2021-04-29 ENCOUNTER — Ambulatory Visit (HOSPITAL_BASED_OUTPATIENT_CLINIC_OR_DEPARTMENT_OTHER): Payer: 59

## 2021-05-02 ENCOUNTER — Other Ambulatory Visit: Payer: Self-pay | Admitting: Neurology

## 2021-05-11 ENCOUNTER — Ambulatory Visit (HOSPITAL_BASED_OUTPATIENT_CLINIC_OR_DEPARTMENT_OTHER): Payer: 59 | Attending: Student in an Organized Health Care Education/Training Program

## 2021-05-16 ENCOUNTER — Encounter: Payer: Self-pay | Admitting: Adult Health

## 2021-05-16 ENCOUNTER — Other Ambulatory Visit: Payer: Self-pay | Admitting: Neurology

## 2021-05-16 ENCOUNTER — Ambulatory Visit: Payer: Medicaid Other | Admitting: Adult Health

## 2021-05-16 NOTE — Addendum Note (Signed)
Addended by: Neomia Dear on: 05/16/2021 04:35 PM   Modules accepted: Orders

## 2021-05-31 ENCOUNTER — Encounter: Payer: Self-pay | Admitting: *Deleted

## 2021-06-06 ENCOUNTER — Encounter: Payer: 59 | Admitting: Internal Medicine

## 2021-07-25 ENCOUNTER — Encounter: Payer: 59 | Admitting: Student

## 2021-07-25 ENCOUNTER — Telehealth: Payer: Self-pay | Admitting: *Deleted

## 2021-07-25 ENCOUNTER — Other Ambulatory Visit: Payer: 59

## 2021-07-25 NOTE — Telephone Encounter (Signed)
Received call from patient's girlfriend (EC). States patient has been having intermittent fevers as high as 102 F, along with bilat leg joint pain. Requesting to change today's lab only visit to Provider visit. 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

## 2021-07-27 ENCOUNTER — Other Ambulatory Visit: Payer: Self-pay

## 2021-07-27 ENCOUNTER — Encounter: Payer: Self-pay | Admitting: Internal Medicine

## 2021-07-27 ENCOUNTER — Ambulatory Visit (INDEPENDENT_AMBULATORY_CARE_PROVIDER_SITE_OTHER): Payer: Medicaid Other | Admitting: Internal Medicine

## 2021-07-27 VITALS — BP 148/98 | HR 73 | Temp 99.3°F | Resp 32 | Ht 72.0 in | Wt 230.1 lb

## 2021-07-27 DIAGNOSIS — M3390 Dermatopolymyositis, unspecified, organ involvement unspecified: Secondary | ICD-10-CM

## 2021-07-27 DIAGNOSIS — I10 Essential (primary) hypertension: Secondary | ICD-10-CM | POA: Diagnosis not present

## 2021-07-27 MED ORDER — PREDNISONE 20 MG PO TABS: 40.0000 mg | ORAL_TABLET | Freq: Every day | ORAL | 0 refills | Status: DC

## 2021-07-27 NOTE — Progress Notes (Signed)
   CC: Dermatomyositis flair  HPI:[name] is a 40 y.o. male who presents for evaluation of dermatomyositis flair. Please see individual problem based A/P for details.  Past Medical History:  Diagnosis Date   Dermatomyositis (HCC)    Headache    Hypertension    Review of Systems:   Review of Systems  Constitutional:  Negative for chills and fever.  Musculoskeletal:  Positive for myalgias.  Skin:  Positive for rash.  Neurological:  Positive for weakness. Negative for dizziness and focal weakness.    Physical Exam: Vitals:   07/27/21 0907  BP: (!) 148/98  Pulse: 73  Resp: (!) 32  Temp: 99.3 F (37.4 C)  TempSrc: Oral  SpO2: 100%  Weight: 230 lb 1.6 oz (104.4 kg)  Height: 6' (1.829 m)   General: Patient appears ill and uncomfortable, sitting in chair with arms inside short sleeved shirt Cardiovascular: Normal rate, regular rhythm.  No murmurs, rubs, or gallops Pulmonary : Equal breath sounds, No wheezes, rales, or rhonchi MSK: Pain with palpation proximal muscles and movement of extremities. Proximal muscle weakness in all ext. Painful nodules under armpits ( sights of previous known calcium deposits) Skin: Rash across back of shoulder, patchy erythema across upper chest, eyelid erythematous rash and swollen, scaly papules around nail bed       Assessment & Plan:   See Encounters Tab for problem based charting.  Patient discussed with Dr. Oswaldo Done

## 2021-07-28 ENCOUNTER — Encounter: Payer: Self-pay | Admitting: Internal Medicine

## 2021-07-28 LAB — MICROALBUMIN / CREATININE URINE RATIO
Creatinine, Urine: 140 mg/dL
Microalb/Creat Ratio: 240 mg/g creat — ABNORMAL HIGH (ref 0–29)
Microalbumin, Urine: 336.2 ug/mL

## 2021-07-28 LAB — PROTEIN,TOTAL,URINE: Protein, Ur: 98.7 mg/dL

## 2021-07-28 NOTE — Assessment & Plan Note (Signed)
Patient reports being off his Imuran for a month because it makes him feel sluggish. He is having pain in proximal muscles weakness, heliotrope rash, shaw sign, and gottron papules consistent with acute flare of dermatomyositis while being off immunosuppressive medication. Patient has extreme phobia to blood draw requiring treatment with hydroxyzine. He asked to come back tomorrow to obtain labs. Will place future orders. In addition starting patient on prednisone 40 mg daily and asked him to call his rheumatologist to schedule appointment to discuss long term plan and tapering of prednisone. If unable to get appointment in the next two weeks then he will reschedule with our clinic. If not improving with prednisone in the next few days,  I asked patient to contact clinic.

## 2021-07-29 NOTE — Progress Notes (Signed)
Internal Medicine Clinic Attending  Case discussed with Dr. Steen  At the time of the visit.  We reviewed the resident's history and exam and pertinent patient test results.  I agree with the assessment, diagnosis, and plan of care documented in the resident's note.  

## 2021-08-22 NOTE — Assessment & Plan Note (Deleted)
Current medications: losartan 50mg  daily, amlodipine 5mg  daily

## 2021-08-24 ENCOUNTER — Telehealth: Payer: Self-pay | Admitting: *Deleted

## 2021-08-24 ENCOUNTER — Encounter: Payer: 59 | Admitting: Internal Medicine

## 2021-08-24 DIAGNOSIS — I10 Essential (primary) hypertension: Secondary | ICD-10-CM

## 2021-08-24 NOTE — Telephone Encounter (Signed)
Patient contacted regarding his missed appointment with Dr. Ephriam Knuckles. Patient is to call the clinic @ [phone] to reschedule this appointment.

## 2021-10-11 ENCOUNTER — Encounter: Payer: Medicaid Other | Admitting: Internal Medicine

## 2021-10-17 ENCOUNTER — Ambulatory Visit (INDEPENDENT_AMBULATORY_CARE_PROVIDER_SITE_OTHER): Payer: Medicaid Other | Admitting: Internal Medicine

## 2021-10-17 ENCOUNTER — Encounter: Payer: Self-pay | Admitting: Internal Medicine

## 2021-10-17 ENCOUNTER — Other Ambulatory Visit: Payer: Self-pay

## 2021-10-17 VITALS — BP 135/90 | HR 74 | Temp 98.3°F | Ht 72.0 in | Wt 234.4 lb

## 2021-10-17 DIAGNOSIS — M3390 Dermatopolymyositis, unspecified, organ involvement unspecified: Secondary | ICD-10-CM | POA: Diagnosis present

## 2021-10-17 DIAGNOSIS — I10 Essential (primary) hypertension: Secondary | ICD-10-CM | POA: Diagnosis not present

## 2021-10-17 DIAGNOSIS — L219 Seborrheic dermatitis, unspecified: Secondary | ICD-10-CM | POA: Diagnosis not present

## 2021-10-17 MED ORDER — TRIAMCINOLONE ACETONIDE 0.1 % EX LOTN: 1.0000 "application " | TOPICAL_LOTION | Freq: Three times a day (TID) | CUTANEOUS | 0 refills | Status: DC

## 2021-10-17 MED ORDER — MOMETASONE FUROATE 0.1 % EX SOLN: Freq: Every day | CUTANEOUS | 0 refills | Status: DC

## 2021-10-17 NOTE — Progress Notes (Signed)
   CC: Rash, requesting referral to dermatology  HPI:  Mr.Hence Eisenberg is a 40 y.o. with past medical history as noted below who presents to the clinic today for rash, requesting referral to dermatology. Please see problem-based list for further details, assessments, and plans.  Past Medical History:  Diagnosis Date   Dermatomyositis (HCC)    Headache    Hypertension    Review of Systems:  Review of Systems  Constitutional: Negative.   HENT: Negative.    Eyes: Negative.   Respiratory: Negative.    Cardiovascular: Negative.   Gastrointestinal: Negative.   Genitourinary: Negative.   Musculoskeletal: Negative.   Skin:  Positive for itching and rash.  Neurological: Negative.   Endo/Heme/Allergies: Negative.   Psychiatric/Behavioral: Negative.     Physical Exam:  Vitals:   10/17/21 1109  BP: 135/90  Pulse: 74  Temp: 98.3 F (36.8 C)  TempSrc: Oral  SpO2: 100%  Weight: 234 lb 6.4 oz (106.3 kg)  Height: 6' (1.829 m)   Physical Exam Constitutional:      General: He is not in acute distress.    Appearance: Normal appearance.  HENT:     Head: Normocephalic and atraumatic.  Eyes:     Extraocular Movements: Extraocular movements intact.     Pupils: Pupils are equal, round, and reactive to light.  Cardiovascular:     Rate and Rhythm: Normal rate and regular rhythm.     Heart sounds: No murmur heard.   No friction rub. No gallop.  Pulmonary:     Effort: Pulmonary effort is normal.     Breath sounds: Normal breath sounds. No wheezing, rhonchi or rales.  Abdominal:     General: Abdomen is flat. There is no distension.  Musculoskeletal:        General: Tenderness present. Normal range of motion.     Comments: Tenderness over bilateral PIP joints  Skin:    Comments: Scalp: Dandruff over the scalp with diffuse hair loss Face: Heliotrope rash under eyes.  Neurological:     General: No focal deficit present.     Mental Status: He is alert and oriented to person, place,  and time. Mental status is at baseline.  Psychiatric:        Mood and Affect: Mood normal.        Behavior: Behavior normal.    Assessment & Plan:   See Encounters Tab for problem based charting.  Patient seen with Dr. Mayford Knife

## 2021-10-17 NOTE — Patient Instructions (Addendum)
Thank you, [name] for allowing [place] to provide your care today. Today we discussed your scalp and skin rashes.    1) I have ordered you a new (stronger) steroid lotion called mometasone, Apply to scalp daily and lightly massage the scalp. If no improvement in 2 weeks, call our office.  2) I have reordered your triamcinolone lotion. We will refer you to dermatology. Call us with the name of your other cream.  I have ordered the following labs for you:  Lab Orders  No laboratory test(s) ordered today     Referrals ordered today:   Referral Orders  No referral(s) requested today     I have ordered the following medication/changed the following medications:   Stop the following medications: There are no discontinued medications.   Start the following medications: Meds ordered this encounter  Medications   mometasone (ELOCON) 0.1 % lotion    Sig: Apply topically daily.    Dispense:  60 mL    Refill:  0   triamcinolone lotion (KENALOG) 0.1 %    Sig: Apply 1 application topically 3 (three) times daily.    Dispense:  60 mL    Refill:  0     Follow up:  1 month     Should you have any questions or concerns please call the internal medicine clinic at [phone].

## 2021-10-17 NOTE — Assessment & Plan Note (Signed)
Patient's main complaint today is his scalp area.  Over the past couple months, he has noticed dandruff in the scalp, which he has not had before.  It also itches, and he has noticed some diffuse hair loss.  He has tried Derma-Smoothe, [employer], and head and shoulders for treatment.  He states that Derma-Smoothe was originally helping, but it no longer helps now.  He reports that he was seen by dermatologist earlier this year, however this provider is now out of network.  He is requesting referral to a dermatologist in network.  On exam, the patient has dandruff diffusely over the scalp.  Diffuse hair loss noted as well.  P: Prescribed Elocon lotion for the patient today.  Instructed to apply daily and gently massage the scalp area until it is fully rubbed in.

## 2021-10-17 NOTE — Assessment & Plan Note (Deleted)
Patient's main complaint today is his scalp area.  Over the past couple months, he has noticed dandruff in the scalp, which he has not had before.  It also itches, and he has noticed some diffuse hair loss.  He has tried Derma-Smoothe, [employer], and head and shoulders for treatment.  He states that Derma-Smoothe was originally helping, but it no longer helps now.  He reports that he was seen by dermatologist earlier this year, however this provider is now out of network.  He is requesting referral to a dermatologist in network.  On exam, the patient has dandruff diffusely over the scalp.  Diffuse hair loss noted as well.  P: Prescribed Elocon lotion for the patient today.  Instructed to apply daily and gently massage the scalp area until it is fully rubbed in.

## 2021-10-17 NOTE — Assessment & Plan Note (Addendum)
The patient states that his blood pressure medications were recently changed by his rheumatologist.   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  He is also on losartan 50 mg.  His blood pressure today is 135/90.  The patient states that he has not taken his blood pressure medications yet today.  He also has not picked up the diltiazem medication due to issues with his insurance and being able to afford this.  -Continue current regimen

## 2021-10-17 NOTE — Addendum Note (Signed)
Addended by: Verdene Lennert on: 10/17/2021 01:11 PM   Modules accepted: Orders

## 2021-10-17 NOTE — Assessment & Plan Note (Addendum)
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  Currently, he is on Imuran 50 mg daily.  He was on a prednisone taper before, however he discontinued this weeks ago.  He currently uses a "eczema cream" for his face and triamcinolone lotion for his body, including his back and shoulders.  What is most bothersome to him are the areas behind his ears, under his eyes, and his fingers.  He states that his rheumatologist referred him to Physical therapy for muscle aches and has given him meloxicam to take for pain as needed.  The patient states that the eczema cream no longer works for his face, but he is unsure of the name of this cream.  He is requesting more triamcinolone lotion at this time.  He is also requesting a dermatologist who specializes in dermatomyositis.  His last dermatologist who he last saw earlier this year is now out of network for him.  He mentioned maybe wanting to see Dr. Jorja Loa from [facility].  -Refilled triamcinolone lotion today -Continue regular follow-ups with rheumatology -Referral to dermatology placed.  Patient is requesting either Dr. Jorja Loa (if he is in network) or someone else who is in network. -She was instructed to call our office with the name of his eczema cream for his face so that we can document that this cream does not work for him anymore.

## 2021-10-24 NOTE — Progress Notes (Signed)
Internal Medicine Clinic Attending ? ?I saw and evaluated the patient.  I personally confirmed the key portions of the history and exam documented by Dr. Bonanno and I reviewed pertinent patient test results.  The assessment, diagnosis, and plan were formulated together and I agree with the documentation in the resident?s note. ? ?

## 2021-10-28 ENCOUNTER — Telehealth: Payer: Self-pay | Admitting: *Deleted

## 2021-10-28 NOTE — Telephone Encounter (Signed)
Patient called in wanting to know how to take Elocon and Kenalog. Explained that Elocon is to used on scalp and kenalog on body. States he's used to a kenalog cream that comes in a jar. He will try the lotion this weekend and will call back on Monday for cream if not able to use lotion.

## 2021-11-10 ENCOUNTER — Encounter: Payer: Self-pay | Admitting: *Deleted

## 2021-12-04 NOTE — Progress Notes (Signed)
Acute Office Visit   Patient ID: Andrew Byrd, male    DOB: 09/26/1981, 41 y.o.   MRN: [id number]  Subjective:   Andrew Byrd is a 41 year old male with a complicated medical history including dermatomyositis, idiopathic intracranial hypertension, macrophage activation syndrome (resolved), and hypertension who presents for follow up of the above chronic conditions.    LOV with me was 3/22. He had complained of lower extremity swelling at that visit. I had ordered an echocardiogram to r/o cardiac origin however he did not follow through with this.   Last seen in the [facility] on 10/17/21: BP 135/90. Had not switched over to diltiazem due to insurance issues. Requested referral to dermatology  Today's visit: he reports ongoing MSK pain, although slightly improved since being restarted on imdur. He admits that he takes it inconsistently due to forgetting. He has not yet picked up the plaquinil that rheumatology started.  Other than chronic symptoms related to dermatomyositis, he notes that he has been doing ok.  He has switched over to diltiazem and has not experienced any adverse effects from it.  When I was doing a medication reconciliation with him, he showed me that he had some pills in his pocket but did not know which ones were which. We were able to facetime his wife who has better insight into his medical issues. He had the imuran, diltiazem, losartan, and an iron supplement.  Due to his elevated ferritin levels in the past, I found iron supplementation strange so I asked him more about this. He says it was not prescribed by a provider. He was feeling cold, so he started taking it several months ago. He admits that it may be placebo but he thinks that he might feel a little better.   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 This was supported by elevated SIL2R, low NK cell level, hepatomegaly, elevated HScore (reaching 88%), proximal muscle weakness (neck flexors/extensors and proximal hip and shoulder flexors/extensors), rash with interface dermatitis on skin biopsy, MRI findings suggestive of inflammatory myositis, and dysphagia on speech eval. Flow cytometry did not reveal monoclonal B cell population or aberrant T cell population. He underwent pulse dose steroids, oral prednisone, IVIG, 2 doses of anakinra and was discharged on prednisone taper and imuran. His hospitalization was complicated by DILI.  06/2020 rheumatology f/u:  imuran 12m daily continued. Prednisone tapered off  11/18/2020 rheumatology f/u: imuran increased to 1540m 11/21/20: referred to hematology for leukopenia despite being off imuran  12/16/20 hematology eval -Ferritin 1125, iron 69, TIBC 315, %sat 22 -WBC 1.6, hgb 10.3, Plt 203 -Folate 303, B12 131, MMA 101, ESR 93, CRP  7, LDH 172 -CMP unremarkable -T cell receptor gene rearrangement and flow cytometry unable to be collected at the particular site that he went to -he was instructed to follow up after completion of labs  03/10/21 rheumatology f/u: -complained of b/l knee pain (R>L). Xrays revealed soft tissue calcifications anterior to patella that may indicate sequela of dermatomyocytis or polymyositis -urine protein/creatinine 221 -continued on imuran  04/14/21 rheumatology f/u: stable strength, no new weaknesses, dysphagia stable. continued on imuran  07/27/21 [facility]: presented with proximal muscle weakness, heliotrop rash, shaw sign, gottron papules. Admitted to not having taken imuran x1 mo as he felt it was making him sluggish -4056mrednisone ordered  09/15/21 rheumatology f/u: -he had not restarted  imuran until 1-2w prior to OV, WBC 2.6, Hgb 9.9, plat 277, LFTs normal -imuran restarted, prednisone being tapered -referred to PT  11/03/21 rheumatology  f/u -compliant with imuran, no longer on prednisone -reported worsening inflammatory arthritis -started on plaquenil 49m, continue imuran 1559mdaily   Nodules 9/7 abdominal [place]): multiple solid inhomogenous hypoechoic nodular lesions in the lower abdominal wall that are primarily in the fatty tissue. They show a degree of blood flow. They are non-calcified and do not have the appearance of a typical lipoma. Largest nodule measures 1.6x1.3x1.3cm. 08/2020: FNA and core biopsy results reviewed showing fibroadipose tissue with fat necrosis and dystrophic calcification.  10/2020 rheum f/u: -plan to repeat CT imaging in 6 mo  [facility] -[place] (scheduled 3/24)  04/14/21 rheumatology f/u: -reports new painful lumps in bilateral axilla -amlodipine d/c, started on diltiazem -would need ct w/ contrast for nodule growth  09/15/21 rheumatology f/u: -did not transition to diltiazem as instructed, reiterated at visit  11/03/21 rheumatology f/u -finally started diltiazem -no new lesions   Idiopathic Intracranial Hypertension  10/13/20 [facility] with 3w of gradual onset intermittent left sided headache for which head CT was ordered 10/22/20 head CT showed a partially empty sella. Brain MRI ordered. 12/10 he still had not undergone the MRI. Referred to neurology 12/30 brain MRI: 2.1cm left temporal arachnoid cyst, partially empty sella concerning for possible idiopathic intracranial hypertension  12/02/20 neurology OV: referred to ophthalmology to screen for papilledema, sleep study, LP ordered 1/18 LP:  elevated opening pressure of 32.5. He was started on diamox 1/25 ophthalmology eval:  No gross optic nerve edema. Instructed to f/u for repeat testing in 1 mo 1/31 MR venogram. Vascular unremarkable  01/18/21 neurology OV -continue diamox 50023mID  2/24 sleep study: no evidence of significant central or  obstructive sleep apnea.  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     Cholecalciferol 25 MCG (1000 UT) capsule Take 1,000 Units by mouth daily.     cyanocobalamin 1000 MCG tablet Take 1,000 mcg by mouth daily.     diltiazem (CARDIZEM CD) 120 MG 24 hr capsule Take 120 mg by mouth daily.     folic acid (FOLVITE) 1 MG tablet Take 1 tablet by mouth daily.     hydroxychloroquine (PLAQUENIL) 200 MG tablet Take 200 mg by mouth 2 (two) times daily.     hydrOXYzine (ATARAX/VISTARIL) 25 MG tablet Take 25 mg by mouth as needed.     meloxicam (MOBIC) 15 MG tablet Take 15 mg by mouth daily as needed.     mometasone (ELOCON) 0.1 % lotion Apply topically daily. 60 mL 0   pantoprazole (PROTONIX) 40 MG tablet Take 1 tablet (40 mg total) by mouth daily. 90 tablet 1   amLODipine (NORVASC) 5 MG tablet Take 1 tablet (5 mg total) by mouth daily. 90 tablet 1   losartan (COZAAR) 50 MG tablet Take 1 tablet (50 mg total) by mouth daily. 90 tablet 1   predniSONE (DELTASONE) 20 MG tablet Take 2 tablets (40 mg total) by mouth daily with breakfast. 28 tablet 0  topiramate (TOPAMAX) 50 MG tablet Take 2 tablets (100 mg total) by mouth 2 (two) times daily. 360 tablet 1   triamcinolone lotion (KENALOG) 0.1 % Apply 1 application topically 3 (three) times daily. 60 mL 0   No facility-administered medications prior to visit.     Objective:   BP 128/82 (BP Location: Right Arm, Patient Position: Sitting, Cuff Size: Normal) Comment: has not taken BP med this AM   Pulse 82    Temp 98.4 F (36.9 C) (Oral)    Wt 231 lb 6.4 oz (105 kg)    SpO2 100%    BMI 31.38  kg/m  Wt Readings from Last 3 Encounters:  12/07/21 231 lb 6.4 oz (105 kg)  10/17/21 234 lb 6.4 oz (106.3 kg)  07/27/21 230 lb 1.6 oz (104.4 kg)   General: chronically ill appearing in no distress Cardiac: RRR, no LE edema Pulm: lungs clear throughout  MSK: analgesic gait, no joint effusions Skin: mild skin darkening around the eyes and palmar creases, no rash  Assessment & Plan:   Problem List Items Addressed This Visit       Cardiovascular and Mediastinum   Hypertension (Chronic)    Blood pressure is at goal in the office today--128/82. Denies adverse medication effects. Reports adherence to current treatment as is reflected on dispense report.  Continue diltiazem 111m daily. Losartan is no longer preferred under his current insurance plan--transitioned to olmesartan 269mDeclines labs at today's visit due to needle phobia, he will return on Friday with family to have these done      Relevant Medications   olmesartan (BENICAR) 20 MG tablet   diltiazem (CARDIZEM CD) 120 MG 24 hr capsule   Other Relevant Orders   CMP14 + Anion Gap     Digestive   Drug-induced liver injury (Chronic)    DILI resolved on last labs from 08/2021. Will recheck liver enzymes now and resolved this issue if normal.        Musculoskeletal and Integument   Dermatomyositis (HCC) (Chronic)    Currently prescribed imuran 15041maily and plaquenil 200m40mD. He does not take the imuran consistently and has not even picked up plaquenil from the pharmacy. He continues to have pain associated with diagnosis although he reports that it has improved since restarting imuran.  He will continue to follow with rheumatology (pt believes NOV is in February). Imuran and plaquenil managed through their office.  He has an appointment with dermatology scheduled in April I reiterated the importance of being adherent with prescribed therapy        Hematopoietic and Hemostatic   Pancytopenia (HCC)[place]    Last  CBC was in October 2022: Hgb 9.9, WBC 2.9, platelets 277 I have added CBC to his labs for Friday Encouraged follow up with hematology as [place]       Relevant Orders   CBC with Diff   Iron, TIBC and Ferritin Panel     Other   Elevated ferritin level (Chronic)    This is a chronic issue that he follows with hematology for. Most recent ferritin was 3000 from 02/2020. Was speculated to be due to chronic disease.  I have told him to discontinue the iron supplement that he has been taking for several months now. I have added on an iron panel to his labs and he will continue to follow with hematology.      Immunosuppression due to drug therapy (HCC)Andrew Byrd  He declined labs at his visit today but has agreed to return on Friday to have them drawn. He has agreed to receiving the influenza and pneumonia vaccinations at that time.   Upcoming specialty appointments as noted by pt at today's visit Rheumatology February 2023 Opthalmology March 2023 Dermatology April 2023  Return in about 6 months (around 06/06/2022). -for blood pressure follow up   Pt discussed with Dr. Bretta Bang, MD Internal Medicine Resident PGY-3 [facility] 12/07/2021 1:52 PM

## 2021-12-07 ENCOUNTER — Other Ambulatory Visit: Payer: Self-pay

## 2021-12-07 ENCOUNTER — Ambulatory Visit (INDEPENDENT_AMBULATORY_CARE_PROVIDER_SITE_OTHER): Payer: Medicaid Other | Admitting: Internal Medicine

## 2021-12-07 ENCOUNTER — Encounter: Payer: Self-pay | Admitting: Internal Medicine

## 2021-12-07 VITALS — BP 128/82 | HR 82 | Temp 98.4°F | Wt 231.4 lb

## 2021-12-07 DIAGNOSIS — D84821 Immunodeficiency due to drugs: Secondary | ICD-10-CM

## 2021-12-07 DIAGNOSIS — I10 Essential (primary) hypertension: Secondary | ICD-10-CM

## 2021-12-07 DIAGNOSIS — H5340 Unspecified visual field defects: Secondary | ICD-10-CM | POA: Diagnosis not present

## 2021-12-07 DIAGNOSIS — M3390 Dermatopolymyositis, unspecified, organ involvement unspecified: Secondary | ICD-10-CM | POA: Diagnosis not present

## 2021-12-07 DIAGNOSIS — D61818 Other pancytopenia: Secondary | ICD-10-CM | POA: Diagnosis not present

## 2021-12-07 DIAGNOSIS — K719 Toxic liver disease, unspecified: Secondary | ICD-10-CM | POA: Diagnosis not present

## 2021-12-07 DIAGNOSIS — Z79899 Other long term (current) drug therapy: Secondary | ICD-10-CM

## 2021-12-07 DIAGNOSIS — R7989 Other specified abnormal findings of blood chemistry: Secondary | ICD-10-CM | POA: Diagnosis not present

## 2021-12-07 MED ORDER — TRIAMCINOLONE ACETONIDE 0.1 % EX LOTN: 1.0000 "application " | TOPICAL_LOTION | Freq: Three times a day (TID) | CUTANEOUS | 0 refills | Status: DC

## 2021-12-07 MED ORDER — AMLODIPINE BESYLATE 5 MG PO TABS: 5.0000 mg | ORAL_TABLET | Freq: Every day | ORAL | 1 refills | Status: DC

## 2021-12-07 MED ORDER — OLMESARTAN MEDOXOMIL 20 MG PO TABS: 20.0000 mg | ORAL_TABLET | Freq: Every day | ORAL | 1 refills | Status: DC

## 2021-12-07 NOTE — Assessment & Plan Note (Signed)
Blood pressure is at goal in the office today--128/82. Denies adverse medication effects. Reports adherence to current treatment as is reflected on dispense report.   Continue diltiazem 120mg  daily. Losartan is no longer preferred under his current insurance plan--transitioned to olmesartan 20mg   Declines labs at today's visit due to needle phobia, he will return on Friday with family to have these done

## 2021-12-07 NOTE — Assessment & Plan Note (Signed)
Last CBC was in October 2022: Hgb 9.9, WBC 2.9, platelets 277  I have added CBC to his labs for Friday  Encouraged follow up with hematology as requested at their LOV in April 2022

## 2021-12-07 NOTE — Assessment & Plan Note (Addendum)
Follows with Dr. Alden Hipp. Aeon reports that his last visit with them was in October 2022 however last documentation that I can find is from 01/2021 at which time he was noted to have an enlarged blind spot on the right. He has his NOV with Dr. Alden Hipp in March 2023

## 2021-12-07 NOTE — Assessment & Plan Note (Signed)
DILI resolved on last labs from 08/2021. Will recheck liver enzymes now and resolved this issue if normal.

## 2021-12-07 NOTE — Assessment & Plan Note (Addendum)
Currently prescribed imuran 150mg  daily and plaquenil 200mg  BID. He does not take the imuran consistently and has not even picked up plaquenil from the pharmacy. He continues to have pain associated with diagnosis although he reports that it has improved since restarting imuran.   He will continue to follow with rheumatology (pt believes NOV is in February). Imuran and plaquenil managed through their office.   He has an appointment with dermatology scheduled in April  I reiterated the importance of being adherent with prescribed therapy

## 2021-12-07 NOTE — Assessment & Plan Note (Addendum)
This is a chronic issue that he follows with hematology for. Most recent ferritin was 3000 from 02/2020. Was speculated to be due to chronic disease.  I have told him to discontinue the iron supplement that he has been taking for several months now. I have added on an iron panel to his labs and he will continue to follow with hematology.

## 2021-12-08 NOTE — Progress Notes (Signed)
Internal Medicine Clinic Attending  Case discussed with Dr. Christian  At the time of the visit.  We reviewed the resident's history and exam and pertinent patient test results.  I agree with the assessment, diagnosis, and plan of care documented in the resident's note.  

## 2021-12-09 ENCOUNTER — Encounter: Payer: Medicaid Other | Admitting: Internal Medicine

## 2021-12-15 DIAGNOSIS — Z79899 Other long term (current) drug therapy: Secondary | ICD-10-CM | POA: Diagnosis not present

## 2021-12-15 DIAGNOSIS — R6889 Other general symptoms and signs: Secondary | ICD-10-CM | POA: Diagnosis not present

## 2021-12-15 DIAGNOSIS — Z789 Other specified health status: Secondary | ICD-10-CM | POA: Diagnosis not present

## 2021-12-15 DIAGNOSIS — Z7409 Other reduced mobility: Secondary | ICD-10-CM | POA: Diagnosis not present

## 2021-12-15 DIAGNOSIS — R29898 Other symptoms and signs involving the musculoskeletal system: Secondary | ICD-10-CM | POA: Diagnosis not present

## 2021-12-15 DIAGNOSIS — R5382 Chronic fatigue, unspecified: Secondary | ICD-10-CM | POA: Diagnosis not present

## 2021-12-15 DIAGNOSIS — M3390 Dermatopolymyositis, unspecified, organ involvement unspecified: Secondary | ICD-10-CM | POA: Diagnosis not present

## 2021-12-22 DIAGNOSIS — Z789 Other specified health status: Secondary | ICD-10-CM | POA: Diagnosis not present

## 2021-12-22 DIAGNOSIS — Z79899 Other long term (current) drug therapy: Secondary | ICD-10-CM | POA: Diagnosis not present

## 2021-12-22 DIAGNOSIS — R6889 Other general symptoms and signs: Secondary | ICD-10-CM | POA: Diagnosis not present

## 2021-12-22 DIAGNOSIS — R5382 Chronic fatigue, unspecified: Secondary | ICD-10-CM | POA: Diagnosis not present

## 2021-12-22 DIAGNOSIS — M3390 Dermatopolymyositis, unspecified, organ involvement unspecified: Secondary | ICD-10-CM | POA: Diagnosis not present

## 2021-12-22 DIAGNOSIS — Z7409 Other reduced mobility: Secondary | ICD-10-CM | POA: Diagnosis not present

## 2021-12-22 DIAGNOSIS — R29898 Other symptoms and signs involving the musculoskeletal system: Secondary | ICD-10-CM | POA: Diagnosis not present

## 2021-12-27 ENCOUNTER — Other Ambulatory Visit (INDEPENDENT_AMBULATORY_CARE_PROVIDER_SITE_OTHER): Payer: Medicaid Other

## 2021-12-27 DIAGNOSIS — Z23 Encounter for immunization: Secondary | ICD-10-CM

## 2021-12-27 DIAGNOSIS — D61818 Other pancytopenia: Secondary | ICD-10-CM | POA: Diagnosis not present

## 2021-12-27 DIAGNOSIS — I10 Essential (primary) hypertension: Secondary | ICD-10-CM

## 2021-12-27 NOTE — Addendum Note (Signed)
Addended by: Sander Nephew F on: 12/27/2021 04:00 PM   Modules accepted: Orders

## 2021-12-28 ENCOUNTER — Other Ambulatory Visit: Payer: Self-pay | Admitting: Internal Medicine

## 2021-12-28 LAB — CBC WITH DIFFERENTIAL/PLATELET
Basophils Absolute: 0 10*3/uL (ref 0.0–0.2)
Basos: 0 %
EOS (ABSOLUTE): 0.1 10*3/uL (ref 0.0–0.4)
Eos: 4 %
Hematocrit: 27.1 % — ABNORMAL LOW (ref 37.5–51.0)
Hemoglobin: 8.9 g/dL — ABNORMAL LOW (ref 13.0–17.7)
Immature Grans (Abs): 0 10*3/uL (ref 0.0–0.1)
Immature Granulocytes: 0 %
Lymphocytes Absolute: 0.5 10*3/uL — ABNORMAL LOW (ref 0.7–3.1)
Lymphs: 15 %
MCH: 29.8 pg (ref 26.6–33.0)
MCHC: 32.8 g/dL (ref 31.5–35.7)
MCV: 91 fL (ref 79–97)
Monocytes Absolute: 0.3 10*3/uL (ref 0.1–0.9)
Monocytes: 8 %
Neutrophils Absolute: 2.3 10*3/uL (ref 1.4–7.0)
Neutrophils: 73 %
Platelets: 285 10*3/uL (ref 150–450)
RBC: 2.99 x10E6/uL — ABNORMAL LOW (ref 4.14–5.80)
RDW: 15.2 % (ref 11.6–15.4)
WBC: 3.2 10*3/uL — ABNORMAL LOW (ref 3.4–10.8)

## 2021-12-28 LAB — CMP14 + ANION GAP
ALT: 10 IU/L (ref 0–44)
AST: 14 IU/L (ref 0–40)
Albumin/Globulin Ratio: 0.9 — ABNORMAL LOW (ref 1.2–2.2)
Albumin: 3.4 g/dL — ABNORMAL LOW (ref 4.0–5.0)
Alkaline Phosphatase: 80 IU/L (ref 44–121)
Anion Gap: 15 mmol/L (ref 10.0–18.0)
BUN/Creatinine Ratio: 12 (ref 9–20)
BUN: 9 mg/dL (ref 6–20)
Bilirubin Total: 0.3 mg/dL (ref 0.0–1.2)
CO2: 21 mmol/L (ref 20–29)
Calcium: 8.7 mg/dL (ref 8.7–10.2)
Chloride: 101 mmol/L (ref 96–106)
Creatinine, Ser: 0.73 mg/dL — ABNORMAL LOW (ref 0.76–1.27)
Globulin, Total: 3.6 g/dL (ref 1.5–4.5)
Glucose: 81 mg/dL (ref 70–99)
Potassium: 4.2 mmol/L (ref 3.5–5.2)
Sodium: 137 mmol/L (ref 134–144)
Total Protein: 7 g/dL (ref 6.0–8.5)
eGFR: 119 mL/min/{1.73_m2} (ref >59)

## 2021-12-28 LAB — IRON,TIBC AND FERRITIN PANEL
Ferritin: 1057 ng/mL — ABNORMAL HIGH (ref 30–400)
Iron Saturation: 12 % — ABNORMAL LOW (ref 15–55)
Iron: 26 ug/dL — ABNORMAL LOW (ref 38–169)
Total Iron Binding Capacity: 218 ug/dL — ABNORMAL LOW (ref 250–450)
UIBC: 192 ug/dL (ref 111–343)

## 2021-12-28 MED ORDER — TRIAMCINOLONE ACETONIDE 0.1 % EX OINT: 1.0000 "application " | TOPICAL_OINTMENT | Freq: Two times a day (BID) | CUTANEOUS | 0 refills | Status: DC

## 2021-12-28 NOTE — Progress Notes (Signed)
I attempted to contact you by phone but was unable to reach you.  Overall, your labs are fairly stable.  1)  Your electrolytes, kidney function and liver tests are normal. 2)  The results of the iron panel do not suggest that you are iron deficient. You may notice that this panel showed your iron was 26 however this does not mean that you will fix it by consuming iron supplements. Because of the inflammatory condition that you have, your body stores iron in a different form, called ferritin. As you can see from your labs, your ferritin is very elevated. Your hematologist and other specialists have seen this on your prior labs as well. BOTTOM LINE: do not take iron supplements unless instructed to do so by one of your specialists 3)  We also checked your blood counts. These are fairly similar to your prior labs.  Please let me know if you have questions or concerns regarding your labs.  Elige Radon, MD

## 2021-12-29 ENCOUNTER — Telehealth: Payer: Self-pay

## 2021-12-29 ENCOUNTER — Telehealth: Payer: Self-pay | Admitting: Internal Medicine

## 2021-12-29 DIAGNOSIS — M3390 Dermatopolymyositis, unspecified, organ involvement unspecified: Secondary | ICD-10-CM

## 2021-12-29 DIAGNOSIS — D61818 Other pancytopenia: Secondary | ICD-10-CM

## 2021-12-29 NOTE — Telephone Encounter (Signed)
Rec'd a phone call from the pt.  The patient has reached out to his [facility] and Hematology/ Oncology to f/u with sch appt.  This patient has Medicaid and will require New Referral to be placed for 2023  for Insurance purposes.  Please advise if you can place these 2 referral for the patient.

## 2021-12-29 NOTE — Telephone Encounter (Signed)
Requesting lab results, please call pt back.  

## 2022-01-02 ENCOUNTER — Encounter: Payer: Self-pay | Admitting: Internal Medicine

## 2022-01-02 DIAGNOSIS — M3390 Dermatopolymyositis, unspecified, organ involvement unspecified: Secondary | ICD-10-CM | POA: Diagnosis not present

## 2022-01-02 DIAGNOSIS — L219 Seborrheic dermatitis, unspecified: Secondary | ICD-10-CM | POA: Diagnosis not present

## 2022-01-02 DIAGNOSIS — L853 Xerosis cutis: Secondary | ICD-10-CM | POA: Diagnosis not present

## 2022-01-04 DIAGNOSIS — R5382 Chronic fatigue, unspecified: Secondary | ICD-10-CM | POA: Diagnosis not present

## 2022-01-04 DIAGNOSIS — M3390 Dermatopolymyositis, unspecified, organ involvement unspecified: Secondary | ICD-10-CM | POA: Diagnosis not present

## 2022-01-04 DIAGNOSIS — Z79899 Other long term (current) drug therapy: Secondary | ICD-10-CM | POA: Diagnosis not present

## 2022-01-04 DIAGNOSIS — R29898 Other symptoms and signs involving the musculoskeletal system: Secondary | ICD-10-CM | POA: Diagnosis not present

## 2022-01-04 DIAGNOSIS — R6889 Other general symptoms and signs: Secondary | ICD-10-CM | POA: Diagnosis not present

## 2022-01-04 DIAGNOSIS — Z789 Other specified health status: Secondary | ICD-10-CM | POA: Diagnosis not present

## 2022-01-04 DIAGNOSIS — Z7409 Other reduced mobility: Secondary | ICD-10-CM | POA: Diagnosis not present

## 2022-01-05 ENCOUNTER — Inpatient Hospital Stay: Payer: Medicaid Other | Attending: Hematology & Oncology

## 2022-01-05 ENCOUNTER — Inpatient Hospital Stay: Payer: Medicaid Other | Admitting: Hematology & Oncology

## 2022-01-11 ENCOUNTER — Inpatient Hospital Stay: Payer: Medicaid Other | Admitting: Hematology & Oncology

## 2022-01-11 ENCOUNTER — Inpatient Hospital Stay: Payer: Medicaid Other

## 2022-01-19 DIAGNOSIS — Z79899 Other long term (current) drug therapy: Secondary | ICD-10-CM | POA: Diagnosis not present

## 2022-01-19 DIAGNOSIS — Z79624 Long term (current) use of inhibitors of nucleotide synthesis: Secondary | ICD-10-CM | POA: Diagnosis not present

## 2022-01-19 DIAGNOSIS — E559 Vitamin D deficiency, unspecified: Secondary | ICD-10-CM | POA: Diagnosis not present

## 2022-01-19 DIAGNOSIS — Z79631 Long term (current) use of antimetabolite agent: Secondary | ICD-10-CM | POA: Diagnosis not present

## 2022-01-19 DIAGNOSIS — R1310 Dysphagia, unspecified: Secondary | ICD-10-CM | POA: Diagnosis not present

## 2022-01-19 DIAGNOSIS — K5900 Constipation, unspecified: Secondary | ICD-10-CM | POA: Diagnosis not present

## 2022-01-19 DIAGNOSIS — M3313 Other dermatomyositis without myopathy: Secondary | ICD-10-CM | POA: Diagnosis not present

## 2022-01-19 DIAGNOSIS — M3390 Dermatopolymyositis, unspecified, organ involvement unspecified: Secondary | ICD-10-CM | POA: Diagnosis not present

## 2022-03-29 DIAGNOSIS — M3390 Dermatopolymyositis, unspecified, organ involvement unspecified: Secondary | ICD-10-CM | POA: Diagnosis not present

## 2022-03-29 DIAGNOSIS — K5900 Constipation, unspecified: Secondary | ICD-10-CM | POA: Diagnosis not present

## 2022-04-10 ENCOUNTER — Encounter: Payer: Self-pay | Admitting: Internal Medicine

## 2022-04-10 DIAGNOSIS — L853 Xerosis cutis: Secondary | ICD-10-CM | POA: Insufficient documentation

## 2022-04-10 HISTORY — DX: Xerosis cutis: L85.3

## 2022-05-22 ENCOUNTER — Encounter: Payer: Self-pay | Admitting: *Deleted

## 2022-06-12 ENCOUNTER — Ambulatory Visit (INDEPENDENT_AMBULATORY_CARE_PROVIDER_SITE_OTHER): Payer: Medicaid Other | Admitting: Student

## 2022-06-12 VITALS — BP 127/80 | HR 84 | Temp 99.2°F | Ht 72.0 in | Wt 227.9 lb

## 2022-06-12 DIAGNOSIS — M3390 Dermatopolymyositis, unspecified, organ involvement unspecified: Secondary | ICD-10-CM | POA: Diagnosis not present

## 2022-06-12 NOTE — Progress Notes (Signed)
CC: Inflammation around eyes  HPI:  [name] is a 41 y.o. male living with a history stated below and presents today for inflammation around his eyes. Please see problem based assessment and plan for additional details.  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     Cholecalciferol 25 MCG (1000 UT) capsule Take 1,000 Units by mouth daily.     cyanocobalamin 1000 MCG tablet Take 1,000 mcg by mouth daily.     diltiazem (CARDIZEM CD) 120 MG 24 hr capsule Take 120 mg by mouth daily.     folic acid (FOLVITE) 1 MG tablet Take 1 tablet by mouth daily.     hydroxychloroquine (PLAQUENIL) 200 MG tablet Take 200 mg by mouth 2 (two) times daily.     hydrOXYzine (ATARAX/VISTARIL) 25 MG tablet Take 25 mg by mouth as needed.     meloxicam (MOBIC) 15 MG tablet Take 15 mg by mouth daily as needed.     mometasone (ELOCON) 0.1 % lotion Apply topically daily. 60 mL 0   olmesartan (BENICAR) 20 MG tablet Take 1 tablet (20 mg total) by mouth daily. 90 tablet 1   pantoprazole (PROTONIX) 40 MG tablet Take 1 tablet (40 mg total) by mouth daily. 90 tablet 1   triamcinolone ointment (KENALOG) 0.1 % Apply 1 application topically 2 (two) times daily. 30 g 0   No current facility-administered medications on file prior to visit.    Family History  Problem  Relation Age of Onset   Hypertension Other    Cancer Mother     Social History   Socioeconomic History   Marital status: Married    Spouse name: Derek Jack   Number of children: 6   Years of education: Not on file   Highest education level: GED or equivalent  Occupational History    Comment: NA, disabled  Tobacco Use   Smoking status: Never   Smokeless tobacco: Never  Vaping Use   Vaping Use: Never used  Substance and Sexual Activity   Alcohol use: No   Drug use: Yes    Types: Marijuana    Comment: 12/02/20 3 x week-appetite   Sexual activity: Yes    Partners: Female  Other Topics Concern   Not on file  Social History Narrative   Lives with wife, children   Caffeine, sodas/tea- all day   Social Determinants of Health   Financial Resource Strain: Not on file  Food Insecurity: Not on file  Transportation Needs: Not on file  Physical Activity: Not on file  Stress: Not on file  Social Connections: Not on file  Intimate Partner Violence: Not on file    Review of Systems: ROS negative except for what is noted on the assessment and plan.  Vitals:   06/12/22 1435  BP: 127/80  Pulse: 84  Temp: 99.2 F (37.3 C)  TempSrc: Oral  SpO2: 100%  Weight: 227 lb 14.4 oz (103.4 kg)  Height: 6' (1.829 m)    Physical Exam: Constitutional: well-appearing male sitting anxiously, in no acute distress HENT: normocephalic atraumatic, mucous membranes moist Eyes: Clear swelling above and below both of his eyes Cardiovascular: regular rate and rhythm, no m/r/g Pulmonary/Chest: normal work of breathing on room air, lungs clear to auscultation bilaterally MSK: normal bulk and tone, eczema on both arms and legs Skin: warm and dry   Assessment & Plan:   Dermatomyositis (HCC) Patient presents to the clinic today with inflammation above the upper eyelid of the eye, as well as lower.  He states that usually he wakes up with this, but the inflammation disappears after 3 to 4 hours after  taking azathioprine 50 mg.  However for the past 4 to 5 days the inflammation has not decreased after taking his medication.  He denies any pain to the eyes, however says that his eyes feel heavy when they are open.  He denies any visual changes, however states he has some photosensitivity, which has been going on since his diagnosis.  He states that a cold rag held to his eyes for 10 minutes has helped the inflammation go down, but is not a fix.  Plan:  1.  More than likely this could be the beginning of a heliotrope rash forming, due to his dermatomyositis. 2.  Placed a consult with ophthalmology (Dr. Dione Booze) to evaluate the inflammation.  3.  Patient's next rheumatology appointment is in August, he may need to increase his azathioprine to get better control of his dermatomyositis.  Patient seen with Dr. Rockey Situ, M.D. [facility] Internal Medicine, PGY-1 Phone: [phone] Date 06/12/2022 Time 5:49 PM

## 2022-06-12 NOTE — Assessment & Plan Note (Addendum)
Patient presents to the clinic today with inflammation above the upper eyelid of the eye, as well as lower b/l.  He states that usually he wakes up with this, but the inflammation disappears after 3 to 4 hours after taking azathioprine 50 mg.  However for the past 4 to 5 days the inflammation has not decreased after taking his medication.  He denies any pain to the eyes, however says that his eyes feel heavy when they are open.  He denies any visual changes, however states he has some photosensitivity, which has been going on since his diagnosis.  He states that a cold rag held to his eyes for 10 minutes has helped the inflammation go down, but is not a fix.  Plan:  1.  More than likely this could be the beginning of a heliotrope rash forming, due to his dermatomyositis. 2.  Placed a consult with ophthalmology (Dr. Dione Booze) to evaluate the inflammation.  3.  Patient's next rheumatology appointment is in August, he may need to increase his azathioprine to get better control of his dermatomyositis.

## 2022-06-12 NOTE — Patient Instructions (Signed)
Thank you so much for coming to the clinic today!  We talked about a few things today, but here is a quick summary:  1.  We talked about the inflammation going on around your eye, it seems like it could be something called a heliotrope rash.  We really like you to see your rheumatologist next month, make sure you do not miss the appointment.  We are also putting in a referral to ophthalmology. 2.  For your rash, you may be able to take some over-the-counter medications like Claritin or Benadryl.  If you have any questions please feel free to contact the clinic at [phone]

## 2022-06-14 NOTE — Progress Notes (Signed)
Internal Medicine Clinic Attending  I saw and evaluated the patient.  I personally confirmed the key portions of the history and exam documented by the resident  and I reviewed pertinent patient test results.  The assessment, diagnosis, and plan were formulated together and I agree with the documentation in the resident's note.  

## 2022-07-13 DIAGNOSIS — Z20822 Contact with and (suspected) exposure to covid-19: Secondary | ICD-10-CM | POA: Diagnosis not present

## 2022-07-13 DIAGNOSIS — R197 Diarrhea, unspecified: Secondary | ICD-10-CM | POA: Diagnosis not present

## 2022-07-13 DIAGNOSIS — R0602 Shortness of breath: Secondary | ICD-10-CM | POA: Diagnosis not present

## 2022-07-13 DIAGNOSIS — R509 Fever, unspecified: Secondary | ICD-10-CM | POA: Diagnosis not present

## 2022-07-13 DIAGNOSIS — J189 Pneumonia, unspecified organism: Secondary | ICD-10-CM | POA: Diagnosis not present

## 2022-07-13 DIAGNOSIS — R112 Nausea with vomiting, unspecified: Secondary | ICD-10-CM | POA: Diagnosis not present

## 2022-07-13 DIAGNOSIS — R5383 Other fatigue: Secondary | ICD-10-CM | POA: Diagnosis not present

## 2022-07-14 DIAGNOSIS — R509 Fever, unspecified: Secondary | ICD-10-CM | POA: Diagnosis not present

## 2022-07-14 DIAGNOSIS — R112 Nausea with vomiting, unspecified: Secondary | ICD-10-CM | POA: Diagnosis not present

## 2022-07-14 DIAGNOSIS — R197 Diarrhea, unspecified: Secondary | ICD-10-CM | POA: Diagnosis not present

## 2022-07-14 DIAGNOSIS — R0602 Shortness of breath: Secondary | ICD-10-CM | POA: Diagnosis not present

## 2022-08-01 ENCOUNTER — Encounter: Payer: Self-pay | Admitting: Student

## 2022-08-01 ENCOUNTER — Other Ambulatory Visit: Payer: Self-pay

## 2022-08-01 ENCOUNTER — Ambulatory Visit (INDEPENDENT_AMBULATORY_CARE_PROVIDER_SITE_OTHER): Payer: Medicaid Other | Admitting: Student

## 2022-08-01 DIAGNOSIS — M3390 Dermatopolymyositis, unspecified, organ involvement unspecified: Secondary | ICD-10-CM | POA: Diagnosis not present

## 2022-08-01 DIAGNOSIS — Z8701 Personal history of pneumonia (recurrent): Secondary | ICD-10-CM

## 2022-08-01 HISTORY — DX: Personal history of pneumonia (recurrent): Z87.01

## 2022-08-01 NOTE — Patient Instructions (Addendum)
[name], it was a pleasure seeing you today!  Today we discussed: - Your pneumonia has resolved! No need for any other steroids or antibiotics.  - Please follow-up with your rheumatologist in 1-2 weeks..  Follow-up:  if symptoms do not improve    Please make sure to arrive 15 minutes prior to your next appointment. If you arrive late, you may be asked to reschedule.   We look forward to seeing you next time. Please call our clinic at [phone] if you have any questions or concerns. The best time to call is Monday-Friday from 9am-4pm, but there is someone available 24/7. If after hours or the weekend, call the main hospital number and ask for the Internal Medicine Resident On-Call. If you need medication refills, please notify your pharmacy one week in advance and they will send [place] a request.  Thank you for letting us take part in your care. Wishing you the best!  Thank you, Evlyn Kanner, MD

## 2022-08-01 NOTE — Assessment & Plan Note (Signed)
[name] is presenting today after recent pneumonia. He is having non-specific symptoms of fatigue, poor appetite today. It appears his pneumonia has resolved, as he no longer has respiratory symptoms. 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 He does take both azathioprine and Plaquenil, although does mention he sometimes misses doses.  It does not appear he is in an acute flare of dermatomyositis. I have encouraged him to continue with his medications and follow-up with his rheumatologist in the next 1-2 weeks. Patient verbalized understanding.

## 2022-08-01 NOTE — Progress Notes (Signed)
   CC: ED follow-up  HPI:  Mr.Barett Heinzman is a 41 y.o. person with dermatomyositis, hypertension presenting to [facility] for ED follow-up.  Please see problem-based list for further details, assessments, and plans.  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 while inpatient on the basis of elevated SIL2R, low NK cell level, hepatomegaly, elevated HScore (reaching 88%), proximal muscle weakness (neck flexors/extensors and proximal hip and shoulder flexors/extensors), elevated SIL2R, rash with interface dermatitis on skin biopsy, MRI findings suggestive of in   Review of Systems:  As per HPI  Physical Exam:  Vitals:   08/01/22 0935  BP: (!) 139/96  Pulse: 84  Temp: 98.9 F (37.2 C)  TempSrc: Oral  SpO2: 100%  Weight: 234 lb 11.2 oz (106.5 kg)  Height: 6' (1.829 m)   General: Appears fatigued, resting comfortably in no acute distress CV: Regular rate, rhythm. No murmurs appreciated. Warm extremities. Pulm: Normal work of breathing on room air. Clear to ausculation bilaterally MSK: Normal bulk, tone. No peripheral edema appreciated Skin: Wam, dry. No rashes or lesions appreciated. Neuro: Awake, alert, conversing appropriately. Grossly non-focal. Psych: Normal mood, affect, speech.   Assessment & Plan:   History of recent pneumonia Mr. Bourdon is presenting today after recent bout of pneumonia. He reports he was having dyspnea, body aches, and chills prior to presenting to the Emergency Department a couple weeks ago. He was given a steroid pack and antibiotics, both of which he has completed. Today, he reports his breathing has improved, although not all the way back to baseline. Overall still feels fatigued, doesn't quite feel himself still. Denies fevers, cough, nausea, vomiting, abdominal  pain, diarrhea.   On exam, Mr. Kishi does appear fatigued, but otherwise exam benign - lungs clear to ausculation, no new rashes or lesions. Given his underlying autoimmune disease, suspect he is still recovering from his pneumonia or this could be primarily due to his underlying disease. However, he doesn't feel like this accurately represents his typical dermatomyositis flare. He does not need any further antibiotics, steroids, or imaging. He is to follow-up with his rheumatologist.  Dermatomyositis [facility]) Mr. Pembroke is presenting today after recent pneumonia. He is having non-specific symptoms of fatigue, poor appetite today. It appears his pneumonia has resolved, as he no longer has respiratory symptoms. 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 He does take both azathioprine and Plaquenil, although does mention he sometimes misses doses.  It does not appear he is in an acute flare of dermatomyositis. I have encouraged him to continue with his medications and follow-up with his rheumatologist in the next 1-2 weeks. Patient verbalized understanding.   Patient discussed with Dr.  Maebelle Munroe, MD Internal Medicine PGY-3 Pager: [phone]

## 2022-08-01 NOTE — Assessment & Plan Note (Signed)
Andrew Byrd is presenting today after recent bout of pneumonia. He reports he was having dyspnea, body aches, and chills prior to presenting to the Emergency Department a couple weeks ago. He was given a steroid pack and antibiotics, both of which he has completed. Today, he reports his breathing has improved, although not all the way back to baseline. Overall still feels fatigued, doesn't quite feel himself still. Denies fevers, cough, nausea, vomiting, abdominal pain, diarrhea.   On exam, Andrew Byrd does appear fatigued, but otherwise exam benign - lungs clear to ausculation, no new rashes or lesions. Given his underlying autoimmune disease, suspect he is still recovering from his pneumonia or this could be primarily due to his underlying disease. However, he doesn't feel like this accurately represents his typical dermatomyositis flare. He does not need any further antibiotics, steroids, or imaging. He is to follow-up with his rheumatologist.

## 2022-08-02 NOTE — Addendum Note (Signed)
Addended by: Dickie La on: 08/02/2022 10:10 AM   Modules accepted: Level of Service

## 2022-08-02 NOTE — Progress Notes (Signed)
Internal Medicine Clinic Attending ? ?Case discussed with Dr. Braswell  At the time of the visit.  We reviewed the resident?s history and exam and pertinent patient test results.  I agree with the assessment, diagnosis, and plan of care documented in the resident?s note.  ?

## 2022-08-03 DIAGNOSIS — G932 Benign intracranial hypertension: Secondary | ICD-10-CM | POA: Diagnosis not present

## 2022-08-03 DIAGNOSIS — R1310 Dysphagia, unspecified: Secondary | ICD-10-CM | POA: Diagnosis not present

## 2022-08-03 DIAGNOSIS — Z79631 Long term (current) use of antimetabolite agent: Secondary | ICD-10-CM | POA: Diagnosis not present

## 2022-08-03 DIAGNOSIS — M3313 Other dermatomyositis without myopathy: Secondary | ICD-10-CM | POA: Diagnosis not present

## 2022-08-03 DIAGNOSIS — Z79624 Long term (current) use of inhibitors of nucleotide synthesis: Secondary | ICD-10-CM | POA: Diagnosis not present

## 2022-08-03 DIAGNOSIS — I10 Essential (primary) hypertension: Secondary | ICD-10-CM | POA: Diagnosis not present

## 2022-08-03 DIAGNOSIS — M3390 Dermatopolymyositis, unspecified, organ involvement unspecified: Secondary | ICD-10-CM | POA: Diagnosis not present

## 2022-08-03 DIAGNOSIS — Z7952 Long term (current) use of systemic steroids: Secondary | ICD-10-CM | POA: Diagnosis not present

## 2022-08-03 DIAGNOSIS — L309 Dermatitis, unspecified: Secondary | ICD-10-CM | POA: Diagnosis not present

## 2022-08-03 DIAGNOSIS — E559 Vitamin D deficiency, unspecified: Secondary | ICD-10-CM | POA: Diagnosis not present

## 2022-08-03 DIAGNOSIS — Z79899 Other long term (current) drug therapy: Secondary | ICD-10-CM | POA: Diagnosis not present

## 2022-08-03 DIAGNOSIS — Z8249 Family history of ischemic heart disease and other diseases of the circulatory system: Secondary | ICD-10-CM | POA: Diagnosis not present

## 2022-08-03 DIAGNOSIS — G4489 Other headache syndrome: Secondary | ICD-10-CM | POA: Diagnosis not present

## 2022-08-03 DIAGNOSIS — Z87891 Personal history of nicotine dependence: Secondary | ICD-10-CM | POA: Diagnosis not present

## 2022-08-03 DIAGNOSIS — K5900 Constipation, unspecified: Secondary | ICD-10-CM | POA: Diagnosis not present

## 2022-09-05 ENCOUNTER — Ambulatory Visit (INDEPENDENT_AMBULATORY_CARE_PROVIDER_SITE_OTHER): Payer: Medicaid Other | Admitting: Student

## 2022-09-05 ENCOUNTER — Other Ambulatory Visit: Payer: Self-pay

## 2022-09-05 ENCOUNTER — Encounter: Payer: Self-pay | Admitting: Student

## 2022-09-05 VITALS — BP 135/77 | HR 85 | Temp 98.5°F | Ht 72.0 in | Wt 230.7 lb

## 2022-09-05 DIAGNOSIS — G932 Benign intracranial hypertension: Secondary | ICD-10-CM

## 2022-09-05 DIAGNOSIS — F419 Anxiety disorder, unspecified: Secondary | ICD-10-CM | POA: Diagnosis not present

## 2022-09-05 DIAGNOSIS — L942 Calcinosis cutis: Secondary | ICD-10-CM | POA: Insufficient documentation

## 2022-09-05 DIAGNOSIS — L853 Xerosis cutis: Secondary | ICD-10-CM | POA: Diagnosis not present

## 2022-09-05 DIAGNOSIS — R0609 Other forms of dyspnea: Secondary | ICD-10-CM

## 2022-09-05 DIAGNOSIS — M3390 Dermatopolymyositis, unspecified, organ involvement unspecified: Secondary | ICD-10-CM | POA: Diagnosis not present

## 2022-09-05 DIAGNOSIS — I10 Essential (primary) hypertension: Secondary | ICD-10-CM

## 2022-09-05 DIAGNOSIS — H5340 Unspecified visual field defects: Secondary | ICD-10-CM | POA: Diagnosis not present

## 2022-09-05 HISTORY — DX: Calcinosis cutis: L94.2

## 2022-09-05 MED ORDER — DILTIAZEM HCL ER COATED BEADS 120 MG PO CP24: 120.0000 mg | ORAL_CAPSULE | Freq: Every day | ORAL | 3 refills | Status: DC

## 2022-09-05 NOTE — Assessment & Plan Note (Signed)
See dermatomyositis.

## 2022-09-05 NOTE — Assessment & Plan Note (Signed)
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  He denies any recent worsening of his vision.  He was post to see ophthalmology recently but had to miss the appointment.  He will call them to reschedule this and we are available to send a new referral if needed.

## 2022-09-05 NOTE — Assessment & Plan Note (Signed)
At the end of the visit and patient noted some dyspnea on exertion that usually occurs with inclines or stairs.  He also has this occurring when walking on flat ground sometimes.  He has a slight increase in dependent lower extremity edema but denies any chest pain, diaphoresis, presyncope, syncope, nausea that occur with the dyspnea.  This will need to be followed up on at his next visit.

## 2022-09-05 NOTE — Assessment & Plan Note (Addendum)
Patient notes that he has not been on Topamax for several months and denies any recent symptoms from his intracranial hypertension.  He denies any headaches or changes in vision.  He still has blurry vision but notices not worsening.  He was set to see an ophthalmologist recently but missed the appointment.  He will call them again to get his appointment rescheduled.  He is also not seen neurology since February 2022 and will likely need to follow-up with them at some point especially if he starts having symptoms again.

## 2022-09-05 NOTE — Assessment & Plan Note (Signed)
Patient has hydroxyzine as needed that he usually uses for situational anxiety such as when he knows he needs blood drawn.  He has not had to use this in a while.

## 2022-09-05 NOTE — Assessment & Plan Note (Signed)
Blood pressure in the office of 135/77.  He continues on olmesartan 20 mg daily.  He was previously on diltiazem 120 mg daily but stopped this about a month ago after running out. - Continue olmesartan 20 mg daily and restart diltiazem 120 mg daily

## 2022-09-05 NOTE — Patient Instructions (Addendum)
  Thank you, [name], for allowing [place] to provide your care today. Today we discussed . . .  > Calcinosis cutis       -We talked about the painful bumps on your lower abdomen that are likely caused by calcium deposits.  On exam they do feel consistent with calcium deposits and they seem very similar to the pain that you have had in the past with these.  We will restart your diltiazem and refer you to dermatology for further evaluation. > Dry skin and itching       -We discussed the options to help with your dry skin including using humidifier, frequently using creams and ointments, increasing Zyrtec to 10 mg twice a day, and using soaps that do not dry her skin out as much.  The soaps are classified as synthetic detergents and it appears that the Dove sensitive skin soap that you use is one of them.  You may also discuss this with the dermatologist at your appointment. > Dermatomyositis/blurry vision and blind spot       -Please continue to follow-up with dermatology and continue on your current treatment with azathioprine and Plaquenil.  Please also make sure to follow-up with your ophthalmologist for evaluation of your vision.   I have ordered the following labs for you:  Lab Orders  No laboratory test(s) ordered today      Tests ordered today:  None   Referrals ordered today:   Referral Orders         Ambulatory referral to Dermatology        I have ordered the following medication/changed the following medications:   Stop the following medications: Medications Discontinued During This Encounter  Medication Reason   diltiazem (CARDIZEM CD) 120 MG 24 hr capsule Reorder     Start the following medications: Meds ordered this encounter  Medications   diltiazem (CARDIZEM CD) 120 MG 24 hr capsule    Sig: Take 1 capsule (120 mg total) by mouth daily.    Dispense:  30 capsule    Refill:  3      Follow up: 3 months    Remember:     Should you have any questions  or concerns please call the internal medicine clinic at [phone].     Johny Blamer, [place]

## 2022-09-05 NOTE — Assessment & Plan Note (Signed)
Patient has complaints of itchy dry skin that usually worsens as the weather tries.  He has tried multiple different lotions, creams, shampoos without much relief.  We discussed a number of first-line treatments for dry skin including using a identifier, consistently using creams and ointments, using soaps for sensitive skin, and taking antihistamines.  He will trial these and we will reevaluate at her next appointment.

## 2022-09-05 NOTE — Assessment & Plan Note (Addendum)
Patient follows with rheumatology and is doing well on azathioprine and hydroxychloroquine.  He is having some calcinosis cutis that is painful in his lower abdomen but otherwise is doing well on these medications.  On exam he does have firm nodules consistent with calcinosis cutis later tender to palpation just above the belt line on his abdomen.  He also has nontender calcinosis in his upper left abdomen.  He will continue to follow with rheumatology and we will restart him on diltiazem 120 mg daily as well as send referral to dermatology for the calcinosis cutis.  He will continue on azathioprine and hydroxychloroquine and continue to follow with rheumatology.

## 2022-09-05 NOTE — Progress Notes (Addendum)
    CC: Dry Itchy Skin  HPI:  [name] is a 41 y.o. 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  Please see encounters tab for problem based charting.  Past Medical History:  Diagnosis Date   Dermatomyositis ([place])    Headache    Hypertension    Macrophage activation syndrome ([place]) 04/16/2020   Now resolved Per [facility] note: Suspected MAS secondary [place] related to dermatomyositis while inpatient on the basis of elevated SIL2R, low NK cell level, hepatomegaly, elevated HScore (reaching 88%), proximal muscle weakness (neck flexors/extensors and proximal hip and shoulder flexors/extensors), elevated SIL2R, rash with interface dermatitis on skin biopsy, MRI findings suggestive of in   Review of Systems:   Endorses dry skin, pruritus, superficial abdominal pain, lower extremity edema, dyspnea on exertion, blurry vision. Denies fever, chest pain, cough, presyncope, syncope, extremity weakness, dark tarry or bloody stools, changes in bowel or bladder habit.  Physical Exam:  Vitals:   09/05/22 1001  BP: 135/77  Pulse: 85  Temp: 98.5 F (36.9 C)  TempSrc: Oral  SpO2: 99%  Weight: 230 lb 11.2 oz (104.6 kg)  Height: 6' (1.829 m)   Constitutional: Obese male sitting in his chair. In no acute distress. HENT: Normocephalic atraumatic Eyes: Sclera nonicteric, EOM intact Cardio:Regular rate and rhythm. No murmurs, rubs, or gallops.  2+ bilateral radial and pedal pulses Pulm:Clear to auscultation bilaterally. Normal work of breathing on room air. Abdomen: Soft with firm tissue overlying the lower abdomen just above the waistline is tender to palpation, 2 x 4 cm area of firm tissue is nontender in the left upper abdomen.  Positive bowel sounds MSK: 0-1+ pitting edema in the lower extremities bilaterally just above the ankles Skin:Warm and dry with  flaking skin without evidence of cracking or bleeding.  No appreciable rashes. Neuro:Alert and oriented x3. No focal deficit noted. Psych:Pleasant mood and affect.   Assessment & Plan:   See Encounters Tab for problem based charting.  Patient seen with Dr. Evette Doffing

## 2022-09-06 NOTE — Progress Notes (Signed)
Internal Medicine Clinic Attending  I saw and evaluated the patient.  I personally confirmed the key portions of the history and exam documented by Dr. Goodwin and I reviewed pertinent patient test results.  The assessment, diagnosis, and plan were formulated together and I agree with the documentation in the resident's note.  

## 2022-09-27 DIAGNOSIS — M3390 Dermatopolymyositis, unspecified, organ involvement unspecified: Secondary | ICD-10-CM | POA: Diagnosis not present

## 2022-09-27 DIAGNOSIS — L219 Seborrheic dermatitis, unspecified: Secondary | ICD-10-CM | POA: Diagnosis not present

## 2022-09-27 DIAGNOSIS — Z79899 Other long term (current) drug therapy: Secondary | ICD-10-CM | POA: Diagnosis not present

## 2022-09-27 DIAGNOSIS — M3313 Other dermatomyositis without myopathy: Secondary | ICD-10-CM | POA: Diagnosis not present

## 2022-12-06 ENCOUNTER — Other Ambulatory Visit: Payer: Self-pay

## 2022-12-06 ENCOUNTER — Other Ambulatory Visit: Payer: Self-pay | Admitting: Student

## 2022-12-06 DIAGNOSIS — L853 Xerosis cutis: Secondary | ICD-10-CM

## 2022-12-06 MED ORDER — KETOCONAZOLE 2 % EX SHAM: 1.0000 | MEDICATED_SHAMPOO | CUTANEOUS | 0 refills | Status: AC

## 2022-12-06 MED ORDER — TRIAMCINOLONE ACETONIDE 0.1 % EX OINT: 1.0000 | TOPICAL_OINTMENT | Freq: Two times a day (BID) | CUTANEOUS | 0 refills | Status: DC

## 2022-12-06 NOTE — Telephone Encounter (Signed)
Also requesting ketoconzole shampoo, it's not on the list. Pt states he got this from the clinic before.

## 2022-12-07 DIAGNOSIS — M3399 Dermatopolymyositis, unspecified with other organ involvement: Secondary | ICD-10-CM | POA: Diagnosis not present

## 2022-12-07 DIAGNOSIS — R519 Headache, unspecified: Secondary | ICD-10-CM | POA: Diagnosis not present

## 2022-12-07 DIAGNOSIS — Z79624 Long term (current) use of inhibitors of nucleotide synthesis: Secondary | ICD-10-CM | POA: Diagnosis not present

## 2022-12-07 DIAGNOSIS — Z7952 Long term (current) use of systemic steroids: Secondary | ICD-10-CM | POA: Diagnosis not present

## 2022-12-07 DIAGNOSIS — E559 Vitamin D deficiency, unspecified: Secondary | ICD-10-CM | POA: Diagnosis not present

## 2022-12-07 DIAGNOSIS — D761 Hemophagocytic lymphohistiocytosis: Secondary | ICD-10-CM | POA: Diagnosis not present

## 2022-12-07 DIAGNOSIS — Z119 Encounter for screening for infectious and parasitic diseases, unspecified: Secondary | ICD-10-CM | POA: Diagnosis not present

## 2022-12-07 DIAGNOSIS — G932 Benign intracranial hypertension: Secondary | ICD-10-CM | POA: Diagnosis not present

## 2022-12-07 DIAGNOSIS — Z79899 Other long term (current) drug therapy: Secondary | ICD-10-CM | POA: Diagnosis not present

## 2022-12-07 DIAGNOSIS — M3390 Dermatopolymyositis, unspecified, organ involvement unspecified: Secondary | ICD-10-CM | POA: Diagnosis not present

## 2022-12-07 DIAGNOSIS — R1310 Dysphagia, unspecified: Secondary | ICD-10-CM | POA: Diagnosis not present

## 2022-12-13 ENCOUNTER — Encounter: Payer: Medicaid Other | Admitting: Student

## 2022-12-13 DIAGNOSIS — M3390 Dermatopolymyositis, unspecified, organ involvement unspecified: Secondary | ICD-10-CM | POA: Diagnosis not present

## 2022-12-27 ENCOUNTER — Other Ambulatory Visit: Payer: Self-pay | Admitting: Student

## 2022-12-27 DIAGNOSIS — I10 Essential (primary) hypertension: Secondary | ICD-10-CM

## 2022-12-27 DIAGNOSIS — L942 Calcinosis cutis: Secondary | ICD-10-CM

## 2022-12-27 DIAGNOSIS — M3390 Dermatopolymyositis, unspecified, organ involvement unspecified: Secondary | ICD-10-CM

## 2023-01-09 ENCOUNTER — Encounter: Payer: Medicaid Other | Admitting: Student

## 2023-01-09 ENCOUNTER — Encounter (HOSPITAL_BASED_OUTPATIENT_CLINIC_OR_DEPARTMENT_OTHER): Payer: Self-pay | Admitting: *Deleted

## 2023-01-09 ENCOUNTER — Emergency Department (HOSPITAL_BASED_OUTPATIENT_CLINIC_OR_DEPARTMENT_OTHER)
Admission: EM | Admit: 2023-01-09 | Discharge: 2023-01-09 | Disposition: A | Discharge status: Home / Self Care | Payer: Medicaid Other | Attending: Emergency Medicine | Admitting: Emergency Medicine

## 2023-01-09 DIAGNOSIS — I10 Essential (primary) hypertension: Secondary | ICD-10-CM | POA: Diagnosis not present

## 2023-01-09 DIAGNOSIS — Z79899 Other long term (current) drug therapy: Secondary | ICD-10-CM | POA: Diagnosis not present

## 2023-01-09 DIAGNOSIS — L942 Calcinosis cutis: Secondary | ICD-10-CM | POA: Diagnosis not present

## 2023-01-09 DIAGNOSIS — L732 Hidradenitis suppurativa: Secondary | ICD-10-CM | POA: Diagnosis not present

## 2023-01-09 MED ORDER — OXYCODONE HCL 5 MG PO TABS: 5.0000 mg | ORAL_TABLET | ORAL | 0 refills | Status: DC | PRN

## 2023-01-09 NOTE — ED Provider Notes (Signed)
[facility] Provider Note   CSN: [id number] Arrival date & time: 01/09/23  [id number]     History {Add pertinent medical, surgical, social history, OB history to HPI:1} No chief complaint on file.   Andrew Byrd is a 42 y.o. male.  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 Authorizing Provider  azaTHIOprine (IMURAN) 50 MG tablet Take 150 mg by mouth daily. 02/11/21   [provider]  Cholecalciferol 25 MCG (1000 UT) capsule Take 1,000 Units by mouth daily. 11/18/20   [provider]  cyanocobalamin 1000 MCG tablet Take 1,000 mcg by mouth daily. 12/28/20   [provider]  diltiazem (CARDIZEM CD) 120 MG 24 hr capsule TAKE 1 CAPSULE BY MOUTH EVERY DAY 12/29/22   Johny Blamer, DO  folic acid (FOLVITE) 1 MG tablet Take 1 tablet by mouth daily. 12/28/20   [provider]  hydroxychloroquine (PLAQUENIL) 200 MG tablet Take 200 mg by mouth 2 (two) times daily. 11/30/21   [provider]  hydrOXYzine (ATARAX/VISTARIL) 25 MG tablet Take 25 mg by mouth as needed.    [provider]  ketoconazole (NIZORAL) 2 % shampoo Apply 1 Application topically 2 (two) times a week. 12/07/22   Johny Blamer, DO  meloxicam (MOBIC) 15 MG tablet Take 15 mg by mouth daily as needed. 10/11/21   [provider]   mometasone (ELOCON) 0.1 % lotion Apply topically daily. 10/17/21   Jose Persia, MD  olmesartan (BENICAR) 20 MG tablet Take 1 tablet (20 mg total) by mouth daily. 12/07/21   Mitzi Hansen, MD  pantoprazole (PROTONIX) 40 MG tablet Take 1 tablet (40 mg total) by mouth daily. 12/28/20   Masoudi, Dorthula Rue, MD  triamcinolone ointment (KENALOG) 0.1 % Apply 1 Application topically 2 (two) times daily. 12/06/22   Johny Blamer, DO      Allergies    Other, Anakinra, and Penicillins    Review of Systems   Review of Systems  Physical Exam Updated Vital Signs BP (!) 169/113 (BP Location: Left Arm)   Pulse 70   Temp 98.5 F (36.9 C) (Oral)   Resp 18   SpO2 100%  Physical Exam  ED Results / Procedures / Treatments   Labs (all labs ordered are listed, but only abnormal results are displayed) Labs Reviewed - No data to display  EKG None  Radiology No results found.  Procedures Procedures  {Document cardiac monitor, telemetry assessment procedure when appropriate:1}  Medications Ordered in ED Medications - No data to display  ED Course/ Medical Decision Making/ A&P   {   Click here for ABCD2, HEART and other calculatorsREFRESH Note before signing :1}                          Medical Decision Making  ***  {Document critical care time when appropriate:1} {Document review of  labs and clinical decision tools ie heart score, Chads2Vasc2 etc:1}  {Document your independent review of radiology images, and any outside records:1} {Document your discussion with family members, caretakers, and with consultants:1} {Document social determinants of health affecting pt's care:1} {Document your decision making why or why not admission, treatments were needed:1} Final Clinical Impression(s) / ED Diagnoses Final diagnoses:  None    Rx / DC Orders ED Discharge Orders     None

## 2023-01-09 NOTE — ED Triage Notes (Signed)
Rt axillary pain, onset approx 1 year ago, pain is increasing worse.

## 2023-01-09 NOTE — ED Notes (Signed)
No rash or redness noted at rt axilla, has limited movement of rt shoulder area due to pain and discomfort, Rt radial pulse easily palpated, color WNL, temp WNL, cap refill WNL

## 2023-01-15 ENCOUNTER — Telehealth: Payer: Self-pay | Admitting: *Deleted

## 2023-01-15 NOTE — Transitions of Care (Post Inpatient/ED Visit) (Unsigned)
   01/15/2023  Name: Verdell Macgregor MRN: [id number] DOB: Sep 03, 1981  {[id number]}

## 2023-01-16 ENCOUNTER — Other Ambulatory Visit: Payer: Self-pay | Admitting: Student

## 2023-01-16 ENCOUNTER — Ambulatory Visit: Payer: Medicaid Other | Admitting: Student

## 2023-01-16 ENCOUNTER — Encounter: Payer: Self-pay | Admitting: Student

## 2023-01-16 ENCOUNTER — Other Ambulatory Visit: Payer: Self-pay

## 2023-01-16 VITALS — BP 161/113 | HR 73 | Temp 98.3°F | Ht 72.0 in | Wt 249.8 lb

## 2023-01-16 DIAGNOSIS — I10 Essential (primary) hypertension: Secondary | ICD-10-CM

## 2023-01-16 DIAGNOSIS — L942 Calcinosis cutis: Secondary | ICD-10-CM

## 2023-01-16 DIAGNOSIS — R809 Proteinuria, unspecified: Secondary | ICD-10-CM | POA: Diagnosis not present

## 2023-01-16 DIAGNOSIS — L853 Xerosis cutis: Secondary | ICD-10-CM | POA: Diagnosis not present

## 2023-01-16 DIAGNOSIS — F419 Anxiety disorder, unspecified: Secondary | ICD-10-CM

## 2023-01-16 DIAGNOSIS — M3390 Dermatopolymyositis, unspecified, organ involvement unspecified: Secondary | ICD-10-CM

## 2023-01-16 DIAGNOSIS — R808 Other proteinuria: Secondary | ICD-10-CM | POA: Insufficient documentation

## 2023-01-16 MED ORDER — TRIAMCINOLONE ACETONIDE 0.1 % EX OINT: 1.0000 | TOPICAL_OINTMENT | Freq: Two times a day (BID) | CUTANEOUS | 0 refills | Status: DC

## 2023-01-16 MED ORDER — HYDROXYZINE HCL 25 MG PO TABS: 25.0000 mg | ORAL_TABLET | ORAL | 0 refills | Status: DC | PRN

## 2023-01-16 MED ORDER — OLMESARTAN MEDOXOMIL 20 MG PO TABS: 20.0000 mg | ORAL_TABLET | Freq: Every day | ORAL | 1 refills | Status: DC

## 2023-01-16 MED ORDER — DILTIAZEM HCL ER COATED BEADS 120 MG PO CP24: 120.0000 mg | ORAL_CAPSULE | Freq: Every day | ORAL | 1 refills | Status: DC

## 2023-01-16 MED ORDER — MELOXICAM 15 MG PO TABS: 15.0000 mg | ORAL_TABLET | Freq: Every day | ORAL | 0 refills | Status: DC | PRN

## 2023-01-16 NOTE — Assessment & Plan Note (Signed)
BP Readings from Last 3 Encounters:  01/16/23 (!) 161/113  01/09/23 (!) 169/113  09/05/22 135/77   Blood pressure significantly elevated today in clinic. Patient has been on diltiazem, mainly for his calcinosis cutis. He previously was on olmesartan, but has not taken this in awhile. He is asymptomatic today. Plan to re-start his medication and have him return to clinic in a month for a re-check.  - Re-start olmesartan 22m daily - Continue diltiazem 1264mdaily - Return to clinic in 1 mo for BP check, BMP

## 2023-01-16 NOTE — Assessment & Plan Note (Signed)
Present during recent lab work with rheumatology. He is not currently on an ACE/ARB. Given his hypertension, we will re-start his olmesartan today.  - Re-start olmesartan 79m daily - Follow-up with nephrology

## 2023-01-16 NOTE — Assessment & Plan Note (Signed)
Patient reports significant pain with his calcinosis cutis lesions, especially in the R axilla. Last week he reports the pain was significant enough that he went to the Emergency Department, where he was given a short course of oxycodone. Since running out of these, he reports significant decrease in functionality with using his right arm because of the pain. Pain is not located in the shoulder and he denies shoulder pain - it is just in the axillary, where it is quite tender to touch. He has been taking diltiazem for awhile, however this has not helped. He takes Tylenol daily, which sometimes helps "take the edge off."  Given his symptoms are severe enough to alter his functionality, I believe this would warrant alternative/additional medications to treat his calcinosis cutis, such as intralesional thiosulfate vs  aluminum hydroxide vs bisphosphonates. I will plan to reach out to his rheumatologist so they can assist with this. In the mean time, I discussed with Andrew Byrd continuing to take Tylenol with as needed meloxicam to help.  - Continue diltiazem 153m daily - Continue Tylenol 5027mevery 4 hours as needed - Meloxicam 1528maily as needed - Discuss with rheumatology alternate treatment options

## 2023-01-16 NOTE — Patient Instructions (Signed)
Andrew Byrd, it was a pleasure seeing you today!  Today we discussed: - Blood pressure: You should be taking two blood pressure medications: diltiazem 136m (which also helps with the calcium deposits) and olmesartan 229m(which also helps protect the kidneys).  - You should also be taking Plaquenil and Imuran for the dermatomyositis.  - For your pain, please take Tylenol (maximum 4,00085maily) with Mobic daily as needed.  - I am going to get in touch with your rheumatologist to see if there's anything else that can be done for the painful calcium deposits.  - Please return in one month to check your blood pressure. Please make sure to bring all of your medications at that time.   I have ordered the following medication/changed the following medications:   Start the following medications: Meds ordered this encounter  Medications   hydrOXYzine (ATARAX) 25 MG tablet    Sig: Take 1 tablet (25 mg total) by mouth as needed.    Dispense:  30 tablet    Refill:  0   triamcinolone ointment (KENALOG) 0.1 %    Sig: Apply 1 Application topically 2 (two) times daily.    Dispense:  30 g    Refill:  0   olmesartan (BENICAR) 20 MG tablet    Sig: Take 1 tablet (20 mg total) by mouth daily.    Dispense:  90 tablet    Refill:  1   meloxicam (MOBIC) 15 MG tablet    Sig: Take 1 tablet (15 mg total) by mouth daily as needed.    Dispense:  30 tablet    Refill:  0   diltiazem (CARDIZEM CD) 120 MG 24 hr capsule    Sig: Take 1 capsule (120 mg total) by mouth daily.    Dispense:  90 capsule    Refill:  1     Follow-up:  1 month    Please make sure to arrive 15 minutes prior to your next appointment. If you arrive late, you may be asked to reschedule.   We look forward to seeing you next time. Please call our clinic at [phone] you have any questions or concerns. The best time to call is Monday-Friday from 9am-4pm, but there is someone available 24/7. If after hours or the weekend, call  the main hospital number and ask for the Internal Medicine Resident On-Call. If you need medication refills, please notify your pharmacy one week in advance and they will send us [place].  Thank you for letting us [place] part in your care. Wishing you the best!  Thank you, PhiSanjuan DameD

## 2023-01-16 NOTE — Progress Notes (Signed)
CC: calcinosis cutis follow-up  HPI:  [name] is a 42 y.o. 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  Please see problem-based list for further details, assessments, and plans.  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   Now resolved Per [facility] note: Suspected MAS secondary [place] related to dermatomyositis while inpatient on the basis of elevated SIL2R, low NK cell level, hepatomegaly, elevated HScore (reaching 88%), proximal muscle weakness (neck flexors/extensors and proximal hip and shoulder flexors/extensors), elevated SIL2R, rash with interface dermatitis on skin biopsy, MRI findings suggestive of in   Review of Systems:  As per HPI  Physical Exam:  Vitals:   01/16/23 1010 01/16/23 1012  BP: (!) 164/112 (!) 161/113  Pulse: 78 73  Temp: 98.3 F (36.8 C)   TempSrc: Oral   SpO2: 100%   Weight: 249 lb 12.8 oz (113.3 kg)   Height: 6' (1.829 m)    General: Resting comfortably in no acute distress CV: Regular rate, rhythm. No murmurs appreciated. Warm extremities.  Pulm: Normal work of breathing on room air. Clear to auscultation bilaterally. MSK: Normal bulk, tone. No peripheral edema. Right shoulder joint without crepitus or bony abnormalities on palpation. Active range of motion, mainly abduction and flexion/extension of shoulder limited by pain.  Skin: Warm, dry. No R axillary cutaneous nodules appreciated on inspection. Numerous subdermal R axillary nodules extremely tender to palpation.  Neuro: Awake, alert, conversing appropriately. Grossly non-focal. Psych: Normal mood, affect, speech.   Assessment & Plan:   Hypertension BP Readings from Last 3 Encounters:  01/16/23 (!) 161/113  01/09/23 (!) 169/113  09/05/22 135/77   Blood pressure significantly elevated today in clinic. Patient has been on diltiazem, mainly  for his calcinosis cutis. He previously was on olmesartan, but has not taken this in awhile. He is asymptomatic today. Plan to re-start his medication and have him return to clinic in a month for a re-check.  - Re-start olmesartan 53m daily - Continue diltiazem 1217mdaily - Return to clinic in 1 mo for BP check, BMP  Dermatomyositis ([place] follows with rheumatology for this issue. He reports compliance with taking azathioprine and hydroxychloroquine. Recently, he was started on IVIG infusions every other week, which has significantly helped him. He reports increased energy overall and ability to function better since starting these.   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 Last week he reports the pain was significant enough that he went to the Emergency Department, where he was given a short course of oxycodone. Since running out of these, he reports significant decrease in functionality with using his right arm because of the pain. Pain is not located in the shoulder and he denies shoulder pain - it is just in the axillary, where it is quite tender to touch. He has been taking diltiazem for awhile, however this has not helped. He takes Tylenol daily, which sometimes helps "take the edge off."  Given his symptoms are severe enough to alter his functionality, I believe this would warrant alternative/additional medications to treat his calcinosis cutis, such as intralesional thiosulfate vs  aluminum hydroxide vs bisphosphonates. I will plan to reach out to his rheumatologist so they can  assist with this. In the mean time, I discussed with [name] continuing to take Tylenol with as needed meloxicam to help.  - Continue diltiazem 167m daily - Continue Tylenol 5084mevery 4 hours as  needed - Meloxicam 1575maily as needed - Discuss with rheumatology alternate treatment options  Microalbuminuria Present during recent lab work with rheumatology. He is not currently on an ACE/ARB. Given his hypertension, we will re-start his olmesartan today.  - Re-start olmesartan 67m79mily - Follow-up with nephrology  Patient discussed with Dr. MachRichrd [facility] Internal Medicine PGY-3 Pager: [phone]

## 2023-01-16 NOTE — Assessment & Plan Note (Signed)
Patient follows with rheumatology for this issue. He reports compliance with taking azathioprine and hydroxychloroquine. Recently, he was started on IVIG infusions every other week, which has significantly helped him. He reports increased energy overall and ability to function better since starting these.   - Follow-up with rheumatology - Azathioprine 130m daily - Hydroxychloroquine 2067mtwice daily - IVIG q2wk per rheumatology

## 2023-01-19 NOTE — Progress Notes (Signed)
Internal Medicine Clinic Attending ? ?Case discussed with Dr. Braswell  At the time of the visit.  We reviewed the resident?s history and exam and pertinent patient test results.  I agree with the assessment, diagnosis, and plan of care documented in the resident?s note.  ?

## 2023-01-22 DIAGNOSIS — D61818 Other pancytopenia: Secondary | ICD-10-CM | POA: Diagnosis not present

## 2023-01-22 DIAGNOSIS — D649 Anemia, unspecified: Secondary | ICD-10-CM | POA: Diagnosis not present

## 2023-01-22 DIAGNOSIS — M3313 Other dermatomyositis without myopathy: Secondary | ICD-10-CM | POA: Diagnosis not present

## 2023-01-22 DIAGNOSIS — E538 Deficiency of other specified B group vitamins: Secondary | ICD-10-CM | POA: Diagnosis not present

## 2023-01-22 DIAGNOSIS — M3390 Dermatopolymyositis, unspecified, organ involvement unspecified: Secondary | ICD-10-CM | POA: Diagnosis not present

## 2023-02-01 DIAGNOSIS — H0288A Meibomian gland dysfunction right eye, upper and lower eyelids: Secondary | ICD-10-CM | POA: Diagnosis not present

## 2023-02-01 DIAGNOSIS — H04129 Dry eye syndrome of unspecified lacrimal gland: Secondary | ICD-10-CM | POA: Diagnosis not present

## 2023-02-01 DIAGNOSIS — H0288B Meibomian gland dysfunction left eye, upper and lower eyelids: Secondary | ICD-10-CM | POA: Diagnosis not present

## 2023-02-01 DIAGNOSIS — H538 Other visual disturbances: Secondary | ICD-10-CM | POA: Diagnosis not present

## 2023-02-01 DIAGNOSIS — Z5181 Encounter for therapeutic drug level monitoring: Secondary | ICD-10-CM | POA: Diagnosis not present

## 2023-02-01 DIAGNOSIS — Z79899 Other long term (current) drug therapy: Secondary | ICD-10-CM | POA: Diagnosis not present

## 2023-02-01 DIAGNOSIS — M3390 Dermatopolymyositis, unspecified, organ involvement unspecified: Secondary | ICD-10-CM | POA: Diagnosis not present

## 2023-02-05 DIAGNOSIS — I10 Essential (primary) hypertension: Secondary | ICD-10-CM | POA: Diagnosis not present

## 2023-02-05 DIAGNOSIS — R801 Persistent proteinuria, unspecified: Secondary | ICD-10-CM | POA: Diagnosis not present

## 2023-02-08 DIAGNOSIS — G932 Benign intracranial hypertension: Secondary | ICD-10-CM | POA: Diagnosis not present

## 2023-02-08 DIAGNOSIS — R1319 Other dysphagia: Secondary | ICD-10-CM | POA: Diagnosis not present

## 2023-02-08 DIAGNOSIS — M3390 Dermatopolymyositis, unspecified, organ involvement unspecified: Secondary | ICD-10-CM | POA: Diagnosis not present

## 2023-02-08 DIAGNOSIS — N069 Isolated proteinuria with unspecified morphologic lesion: Secondary | ICD-10-CM | POA: Diagnosis not present

## 2023-02-08 DIAGNOSIS — E559 Vitamin D deficiency, unspecified: Secondary | ICD-10-CM | POA: Diagnosis not present

## 2023-02-08 DIAGNOSIS — Z7952 Long term (current) use of systemic steroids: Secondary | ICD-10-CM | POA: Diagnosis not present

## 2023-02-08 DIAGNOSIS — Z119 Encounter for screening for infectious and parasitic diseases, unspecified: Secondary | ICD-10-CM | POA: Diagnosis not present

## 2023-02-08 DIAGNOSIS — Z79899 Other long term (current) drug therapy: Secondary | ICD-10-CM | POA: Diagnosis not present

## 2023-02-08 DIAGNOSIS — L942 Calcinosis cutis: Secondary | ICD-10-CM | POA: Diagnosis not present

## 2023-02-08 DIAGNOSIS — G43909 Migraine, unspecified, not intractable, without status migrainosus: Secondary | ICD-10-CM | POA: Diagnosis not present

## 2023-02-08 DIAGNOSIS — Z79624 Long term (current) use of inhibitors of nucleotide synthesis: Secondary | ICD-10-CM | POA: Diagnosis not present

## 2023-02-08 DIAGNOSIS — D61818 Other pancytopenia: Secondary | ICD-10-CM | POA: Diagnosis not present

## 2023-02-08 DIAGNOSIS — R519 Headache, unspecified: Secondary | ICD-10-CM | POA: Diagnosis not present

## 2023-02-09 DIAGNOSIS — N069 Isolated proteinuria with unspecified morphologic lesion: Secondary | ICD-10-CM | POA: Diagnosis not present

## 2023-02-09 DIAGNOSIS — R801 Persistent proteinuria, unspecified: Secondary | ICD-10-CM | POA: Diagnosis not present

## 2023-02-09 DIAGNOSIS — M3313 Other dermatomyositis without myopathy: Secondary | ICD-10-CM | POA: Diagnosis not present

## 2023-02-12 ENCOUNTER — Other Ambulatory Visit: Payer: Self-pay | Admitting: Student

## 2023-02-12 DIAGNOSIS — F419 Anxiety disorder, unspecified: Secondary | ICD-10-CM

## 2023-02-13 DIAGNOSIS — N2889 Other specified disorders of kidney and ureter: Secondary | ICD-10-CM | POA: Diagnosis not present

## 2023-02-13 DIAGNOSIS — R809 Proteinuria, unspecified: Secondary | ICD-10-CM | POA: Diagnosis not present

## 2023-02-17 ENCOUNTER — Other Ambulatory Visit: Payer: Self-pay | Admitting: Student

## 2023-02-17 DIAGNOSIS — L942 Calcinosis cutis: Secondary | ICD-10-CM

## 2023-02-22 DIAGNOSIS — R768 Other specified abnormal immunological findings in serum: Secondary | ICD-10-CM | POA: Diagnosis not present

## 2023-02-22 DIAGNOSIS — N2889 Other specified disorders of kidney and ureter: Secondary | ICD-10-CM | POA: Diagnosis not present

## 2023-02-22 DIAGNOSIS — E559 Vitamin D deficiency, unspecified: Secondary | ICD-10-CM | POA: Diagnosis not present

## 2023-02-22 DIAGNOSIS — I10 Essential (primary) hypertension: Secondary | ICD-10-CM | POA: Diagnosis not present

## 2023-02-22 DIAGNOSIS — R801 Persistent proteinuria, unspecified: Secondary | ICD-10-CM | POA: Diagnosis not present

## 2023-02-23 DIAGNOSIS — N069 Isolated proteinuria with unspecified morphologic lesion: Secondary | ICD-10-CM | POA: Diagnosis not present

## 2023-02-23 DIAGNOSIS — R768 Other specified abnormal immunological findings in serum: Secondary | ICD-10-CM | POA: Diagnosis not present

## 2023-02-23 DIAGNOSIS — R801 Persistent proteinuria, unspecified: Secondary | ICD-10-CM | POA: Diagnosis not present

## 2023-02-23 DIAGNOSIS — M3313 Other dermatomyositis without myopathy: Secondary | ICD-10-CM | POA: Diagnosis not present

## 2023-03-08 DIAGNOSIS — K8020 Calculus of gallbladder without cholecystitis without obstruction: Secondary | ICD-10-CM | POA: Diagnosis not present

## 2023-03-09 DIAGNOSIS — M3313 Other dermatomyositis without myopathy: Secondary | ICD-10-CM | POA: Diagnosis not present

## 2023-03-28 ENCOUNTER — Ambulatory Visit (INDEPENDENT_AMBULATORY_CARE_PROVIDER_SITE_OTHER): Payer: Medicaid Other

## 2023-03-28 ENCOUNTER — Other Ambulatory Visit: Payer: Self-pay

## 2023-03-28 VITALS — BP 134/85 | HR 84 | Temp 98.5°F | Ht 72.0 in | Wt 253.5 lb

## 2023-03-28 DIAGNOSIS — I10 Essential (primary) hypertension: Secondary | ICD-10-CM

## 2023-03-28 NOTE — Progress Notes (Signed)
Patient left before being seen today. I believe he needed to pick up his daughter from school but he left even before his visit was scheduled to begin. His blood pressure was checked and is much improved with medication changes. Will call and ask to reschedule to evaluate new skin lesions he reported to nursing staff.

## 2023-03-30 DIAGNOSIS — M3313 Other dermatomyositis without myopathy: Secondary | ICD-10-CM | POA: Diagnosis not present

## 2023-04-04 DIAGNOSIS — N261 Atrophy of kidney (terminal): Secondary | ICD-10-CM | POA: Diagnosis not present

## 2023-04-04 DIAGNOSIS — R809 Proteinuria, unspecified: Secondary | ICD-10-CM | POA: Diagnosis not present

## 2023-04-04 DIAGNOSIS — B181 Chronic viral hepatitis B without delta-agent: Secondary | ICD-10-CM | POA: Diagnosis not present

## 2023-04-04 DIAGNOSIS — I10 Essential (primary) hypertension: Secondary | ICD-10-CM | POA: Diagnosis not present

## 2023-04-04 DIAGNOSIS — N269 Renal sclerosis, unspecified: Secondary | ICD-10-CM | POA: Diagnosis not present

## 2023-04-04 DIAGNOSIS — N2889 Other specified disorders of kidney and ureter: Secondary | ICD-10-CM | POA: Diagnosis not present

## 2023-04-27 DIAGNOSIS — M3313 Other dermatomyositis without myopathy: Secondary | ICD-10-CM | POA: Diagnosis not present

## 2023-05-09 DIAGNOSIS — I10 Essential (primary) hypertension: Secondary | ICD-10-CM | POA: Diagnosis not present

## 2023-05-09 DIAGNOSIS — R801 Persistent proteinuria, unspecified: Secondary | ICD-10-CM | POA: Diagnosis not present

## 2023-05-10 ENCOUNTER — Encounter: Payer: Medicaid Other | Admitting: Student

## 2023-05-11 DIAGNOSIS — D61818 Other pancytopenia: Secondary | ICD-10-CM | POA: Diagnosis not present

## 2023-05-11 DIAGNOSIS — M3313 Other dermatomyositis without myopathy: Secondary | ICD-10-CM | POA: Diagnosis not present

## 2023-05-25 DIAGNOSIS — M3313 Other dermatomyositis without myopathy: Secondary | ICD-10-CM | POA: Diagnosis not present

## 2023-05-25 DIAGNOSIS — D61818 Other pancytopenia: Secondary | ICD-10-CM | POA: Diagnosis not present

## 2023-06-22 DIAGNOSIS — M3313 Other dermatomyositis without myopathy: Secondary | ICD-10-CM | POA: Diagnosis not present

## 2023-07-12 ENCOUNTER — Ambulatory Visit (INDEPENDENT_AMBULATORY_CARE_PROVIDER_SITE_OTHER): Payer: Medicaid Other | Admitting: Student

## 2023-07-12 ENCOUNTER — Other Ambulatory Visit: Payer: Self-pay

## 2023-07-12 ENCOUNTER — Encounter: Payer: Self-pay | Admitting: Student

## 2023-07-12 VITALS — BP 132/91 | HR 71 | Temp 98.2°F | Ht 72.0 in | Wt 247.3 lb

## 2023-07-12 DIAGNOSIS — R808 Other proteinuria: Secondary | ICD-10-CM | POA: Diagnosis not present

## 2023-07-12 DIAGNOSIS — I10 Essential (primary) hypertension: Secondary | ICD-10-CM

## 2023-07-12 DIAGNOSIS — G932 Benign intracranial hypertension: Secondary | ICD-10-CM

## 2023-07-12 DIAGNOSIS — L309 Dermatitis, unspecified: Secondary | ICD-10-CM

## 2023-07-12 DIAGNOSIS — L853 Xerosis cutis: Secondary | ICD-10-CM

## 2023-07-12 DIAGNOSIS — M3313 Other dermatomyositis without myopathy: Secondary | ICD-10-CM

## 2023-07-12 DIAGNOSIS — L942 Calcinosis cutis: Secondary | ICD-10-CM

## 2023-07-12 MED ORDER — AMLODIPINE BESYLATE 10 MG PO TABS
10.0000 mg | ORAL_TABLET | Freq: Every day | ORAL | 3 refills | Status: AC
Start: 2023-07-12 — End: 2024-07-11

## 2023-07-12 MED ORDER — OLMESARTAN MEDOXOMIL 20 MG PO TABS
20.0000 mg | ORAL_TABLET | Freq: Every day | ORAL | 3 refills | Status: AC
Start: 2023-07-12 — End: 2024-07-11

## 2023-07-12 MED ORDER — TRIAMCINOLONE ACETONIDE 0.1 % EX OINT
1.0000 | TOPICAL_OINTMENT | Freq: Two times a day (BID) | CUTANEOUS | 0 refills | Status: AC
Start: 2023-07-12 — End: ?

## 2023-07-12 MED ORDER — DILTIAZEM HCL ER COATED BEADS 120 MG PO CP24
120.0000 mg | ORAL_CAPSULE | Freq: Every day | ORAL | 3 refills | Status: AC
Start: 2023-07-12 — End: 2024-07-11

## 2023-07-12 NOTE — Patient Instructions (Signed)
Thank you, [name] for allowing [place] to provide your care today. Today we discussed your blood pressure, skin problems, and specialist follow up.    Please pick up your blood pressure medications  My Chart Access: [email or web address]  Please follow-up in: 3 months    We look forward to seeing you next time. Please call our clinic at [phone] if you have any questions or concerns. The best time to call is Monday-Friday from 9am-4pm, but there is someone available 24/7. If after hours or the weekend, call the main hospital number and ask for the Internal Medicine Resident On-Call. If you need medication refills, please notify your pharmacy one week in advance and they will send [place] a request.   Thank you for letting us take part in your care. Wishing you the best!  Morene Crocker, MD 07/12/2023, 11:45 AM [facility] Internal Medicine Resident, PGY-2

## 2023-07-13 DIAGNOSIS — L309 Dermatitis, unspecified: Secondary | ICD-10-CM | POA: Insufficient documentation

## 2023-07-13 NOTE — Assessment & Plan Note (Addendum)
Vitals:   07/12/23 1023 07/12/23 1028  BP: (!) 134/90 (!) 132/91  Patient is currently asymptomatic. He has been out of some of his antihypertensives for a couple of days and felt his blood pressure was high as he reports an episode of headache.  Denies chest pain, HA, shortness of breath, changes in his vision today.  Reviewed records from [facility] and patient is supposed to be [id number] 10 mg, Olmesartan 20 mg, Diltiazem 120 mg, and spironolactone. However, patient has not been taking spironolactone.   He was seen in consultation with nephrology for proteinuria. His work up revealed  mesangiopathic glomerulopathy and is to follow up with nephro in December. They also mentioned the addition of spironolactone in their notes.  Patient is not amenable to get block work done today as he gets it done at [facility] infusions for dermatomyositis (next one on 8/16 -8/20). Plan: - refill Amlodipine, olmesartan, and diltiazem - RTC in 3 months for follow up and for general care as patient has many specialists -Follow up blood work at next OV

## 2023-07-13 NOTE — Progress Notes (Signed)
Subjective:  CC: Blood pressure medication refill  HPI:  [name] is a 42 y.o. male with a past medical history stated below and presents today for blood pressure check. Please see problem based assessment and plan for additional details.  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 while inpatient on the basis of elevated SIL2R, low NK cell level, hepatomegaly, elevated HScore (reaching 88%), proximal muscle weakness (neck flexors/extensors and proximal hip and shoulder flexors/extensors), elevated SIL2R, rash with interface dermatitis on skin biopsy, MRI findings suggestive of in    Current Outpatient Medications on File Prior to Visit  Medication Sig Dispense Refill   clobetasol (TEMOVATE) 0.05 % external solution Apply 1 Application topically 2 (two) times daily.     clobetasol cream (TEMOVATE) 0.05 % Apply 1 Application topically 2 (two) times daily.     hydrocortisone 2.5 % cream Apply 1 Application topically daily. Please apply to itchy areas of face daily     ketoconazole (NIZORAL) 2 % cream Apply to itchy areas on face daily     Multiple Vitamin (MULTI-VITAMIN) tablet Take 1 tablet by mouth daily.     spironolactone (ALDACTONE) 25 MG tablet Take 1 tablet by mouth daily.     azaTHIOprine (IMURAN) 50 MG tablet Take 150 mg by mouth daily.     Cholecalciferol 25 MCG (1000 UT) capsule Take 1,000 Units by mouth daily.     cyanocobalamin 1000 MCG tablet Take 1,000 mcg by mouth daily.     folic acid (FOLVITE) 1 MG tablet Take 1 tablet by mouth daily.     hydroxychloroquine (PLAQUENIL) 200 MG tablet Take 200 mg by mouth 2 (two) times daily.     hydrOXYzine (ATARAX) 25 MG tablet TAKE 1 TABLET (25 MG TOTAL) BY MOUTH AS NEEDED. 90 tablet 1   ketoconazole (NIZORAL) 2 % shampoo Apply 1  Application topically 2 (two) times a week. 120 mL 0   meloxicam (MOBIC) 15 MG tablet TAKE 1 TABLET BY MOUTH DAILY AS NEEDED. 30 tablet 0   oxyCODONE (ROXICODONE) 5 MG immediate release tablet Take 1 tablet (5 mg total) by mouth every 4 (four) hours as needed for severe pain. 4 tablet 0   pantoprazole (PROTONIX) 40 MG tablet Take 1 tablet (40 mg total) by mouth daily. 90 tablet 1   No current facility-administered medications on file prior to visit.    Family History  Problem Relation Age of Onset   Hypertension Other    Cancer Mother     Social History   Socioeconomic History   Marital status: Married    Spouse name: Andrew Byrd   Number of children: 6   Years of education: Not on file   Highest education level: GED or equivalent  Occupational History    Comment: NA, disabled  Tobacco Use   Smoking status: Never   Smokeless tobacco: Never  Vaping Use   Vaping status: Never Used  Substance and Sexual Activity   Alcohol use: No   Drug use: Yes    Types: Marijuana    Comment: 12/02/20 3 x week-appetite   Sexual activity: Yes    Partners: Female  Other Topics Concern   Not on file  Social History Narrative   Lives with wife, children   Caffeine, sodas/tea- all day  Social Determinants of Health   Financial Resource Strain: Not on file  Food Insecurity: Low Risk  (05/09/2023)   [facility]   Food vital sign    Within the past 12 months, you worried that your food would run out before you got money to buy more: Never true    Within the past 12 months, the food you bought just didn't last and you didn't have money to get more. : Never true  Transportation Needs: No Transportation Needs (05/09/2023)   [facility]   Transportation    In the past 12 months, has lack of reliable transportation kept you from medical appointments, meetings, work or from getting things needed for daily living? : No  Physical Activity: Not  on file  Stress: Not on file  Social Connections: Moderately Integrated (01/16/2023)   Social Connection and Isolation Panel [NHANES]    Frequency of Communication with Friends and Family: More than three times a week    Frequency of Social Gatherings with Friends and Family: More than three times a week    Attends Religious Services: More than 4 times per year    Active Member of [employer] or Organizations: No    Attends Banker Meetings: Never    Marital Status: Living with partner  Intimate Partner Violence: Not At Risk (01/16/2023)   Humiliation, Afraid, Rape, and Kick questionnaire    Fear of Current or Ex-Partner: No    Emotionally Abused: No    Physically Abused: No    Sexually Abused: No    Review of Systems: ROS negative except for what is noted on the assessment and plan.  Objective:   Vitals:   07/12/23 1023 07/12/23 1028  BP: (!) 134/90 (!) 132/91  Pulse: 74 71  Temp: 98.2 F (36.8 C)   TempSrc: Oral   SpO2: 100%   Weight: 247 lb 4.8 oz (112.2 kg)   Height: 6' (1.829 m)     Physical Exam: Constitutional: well-appearing young male  sitting in chair, in no acute distress HENT: normocephalic atraumatic, mucous membranes moist Eyes: conjunctiva non-erythematous Neck: supple Cardiovascular: regular rate and rhythm, no m/r/g Pulmonary/Chest: normal work of breathing on room air, lungs clear to auscultation bilaterally Abdominal: soft, non-tender, non-distended MSK: normal bulk and tone Neurological: alert & oriented x 3, 5/5 strength in bilateral upper and lower extremities, normal gait Skin: warm and dry; firm, whitish nodules with excosriations over the bilateral upper arms. There are diffused areas of dry, scaly areas of skin on the trunk, the bilateral arms and the face Psych: Pleasant mood and affect       07/12/2023   10:27 AM  Depression screen PHQ 2/9  Decreased Interest 0  Down, Depressed, Hopeless 0  PHQ - 2 Score 0       10/13/2020    10:07 AM  GAD 7 : Generalized Anxiety Score  Nervous, Anxious, on Edge 0  Control/stop worrying 0  Worry too much - different things 0  Trouble relaxing 1  Restless 0  Easily annoyed or irritable 0  Afraid - awful might happen 0  Total GAD 7 Score 1  Anxiety Difficulty Not difficult at all     Assessment & Plan:   Hypertension Vitals:   07/12/23 1023 07/12/23 1028  BP: (!) 134/90 (!) 132/91  Patient is currently asymptomatic. He has been out of some of his antihypertensives for a couple of days and felt his blood pressure  was high as he reports an episode of headache.  Denies chest pain, HA, shortness of breath, changes in his vision today.  Reviewed records from [facility] and patient is supposed to be [id number] 10 mg, Olmesartan 20 mg, Diltiazem 120 mg, and spironolactone. However, patient has not been taking spironolactone.   He was seen in consultation with nephrology for proteinuria. His work up revealed  mesangiopathic glomerulopathy and is to follow up with nephro in December. They also mentioned the addition of spironolactone in their notes.  Patient is not amenable to get block work done today as he gets it done at IVIG infusions for dermatomyositis (next one on 8/16 -8/20). Plan: - refill Amlodipine, olmesartan, and diltiazem - RTC in 3 months for follow up and for general care as patient has many specialists -Follow up blood work at next OV    Other proteinuria Will follow up with nephrology in December  Idiopathic intracranial hypertension Currently receiving IVIG infusions at [facility] without flares  Eczema of both upper extremities Refilled topical treatment today. He is followed by dermatology -Continue to monitor    Return in about 3 months (around 10/12/2023) for Chronic condition follow up.  Patient discussed with Dr. Alfonse Alpers, MD [facility] Internal Medicine Program - PGY-2 07/13/2023, 10:48 AM

## 2023-07-13 NOTE — Assessment & Plan Note (Signed)
Refilled topical treatment today. He is followed by dermatology -Continue to monitor

## 2023-07-13 NOTE — Assessment & Plan Note (Signed)
Will follow up with nephrology in December

## 2023-07-13 NOTE — Assessment & Plan Note (Addendum)
Currently receiving IVIG infusions at [facility] without flares

## 2023-07-17 NOTE — Progress Notes (Signed)
 Internal Medicine Clinic Attending  Case discussed with the resident at the time of the visit.  We reviewed the resident's history and exam and pertinent patient test results.  I agree with the assessment, diagnosis, and plan of care documented in the resident's note.  Debe Coder, MD

## 2023-07-23 DIAGNOSIS — D761 Hemophagocytic lymphohistiocytosis: Secondary | ICD-10-CM | POA: Diagnosis not present

## 2023-07-26 DIAGNOSIS — D761 Hemophagocytic lymphohistiocytosis: Secondary | ICD-10-CM | POA: Diagnosis not present

## 2023-07-26 DIAGNOSIS — M3390 Dermatopolymyositis, unspecified, organ involvement unspecified: Secondary | ICD-10-CM | POA: Diagnosis not present

## 2023-08-03 DIAGNOSIS — M3313 Other dermatomyositis without myopathy: Secondary | ICD-10-CM | POA: Diagnosis not present

## 2023-08-03 DIAGNOSIS — M3390 Dermatopolymyositis, unspecified, organ involvement unspecified: Secondary | ICD-10-CM | POA: Diagnosis not present

## 2023-08-09 DIAGNOSIS — M3390 Dermatopolymyositis, unspecified, organ involvement unspecified: Secondary | ICD-10-CM | POA: Diagnosis not present

## 2023-08-09 DIAGNOSIS — R942 Abnormal results of pulmonary function studies: Secondary | ICD-10-CM | POA: Diagnosis not present

## 2023-08-10 DIAGNOSIS — F1290 Cannabis use, unspecified, uncomplicated: Secondary | ICD-10-CM | POA: Diagnosis not present

## 2023-08-10 DIAGNOSIS — M3390 Dermatopolymyositis, unspecified, organ involvement unspecified: Secondary | ICD-10-CM | POA: Diagnosis not present

## 2023-08-30 DIAGNOSIS — E559 Vitamin D deficiency, unspecified: Secondary | ICD-10-CM | POA: Diagnosis not present

## 2023-08-30 DIAGNOSIS — R059 Cough, unspecified: Secondary | ICD-10-CM | POA: Diagnosis not present

## 2023-08-30 DIAGNOSIS — Z7952 Long term (current) use of systemic steroids: Secondary | ICD-10-CM | POA: Diagnosis not present

## 2023-08-30 DIAGNOSIS — Z79899 Other long term (current) drug therapy: Secondary | ICD-10-CM | POA: Diagnosis not present

## 2023-08-30 DIAGNOSIS — R809 Proteinuria, unspecified: Secondary | ICD-10-CM | POA: Diagnosis not present

## 2023-08-30 DIAGNOSIS — Z79624 Long term (current) use of inhibitors of nucleotide synthesis: Secondary | ICD-10-CM | POA: Diagnosis not present

## 2023-08-30 DIAGNOSIS — M3313 Other dermatomyositis without myopathy: Secondary | ICD-10-CM | POA: Diagnosis not present

## 2023-08-30 DIAGNOSIS — L942 Calcinosis cutis: Secondary | ICD-10-CM | POA: Diagnosis not present

## 2023-08-31 DIAGNOSIS — M3313 Other dermatomyositis without myopathy: Secondary | ICD-10-CM | POA: Diagnosis not present

## 2023-08-31 DIAGNOSIS — R059 Cough, unspecified: Secondary | ICD-10-CM | POA: Diagnosis not present

## 2023-09-14 DIAGNOSIS — R059 Cough, unspecified: Secondary | ICD-10-CM | POA: Diagnosis not present

## 2023-09-14 DIAGNOSIS — M3313 Other dermatomyositis without myopathy: Secondary | ICD-10-CM | POA: Diagnosis not present

## 2023-09-27 ENCOUNTER — Encounter: Payer: Medicaid Other | Admitting: Student

## 2023-09-27 ENCOUNTER — Telehealth: Payer: Self-pay | Admitting: *Deleted

## 2023-09-27 NOTE — Telephone Encounter (Signed)
RTC to patient given appointment for 3:15 PM today.  Informed that doctor may only address the UTI problem today.

## 2023-09-27 NOTE — Telephone Encounter (Signed)
Call from patient states has pain from Ca;lcium build up.  Abdominal pain .  Problems with Urination when he goes he continues to go.  Patient was informed that there are no available appointments in the Clinics. Advised to go to an Urgent care or an ER for the Urination problem as soon as possible today.   Patient to go to an Urgent Care or the ER.  Patient to call for an appointment later for a referral for his pain.

## 2023-09-28 DIAGNOSIS — M3313 Other dermatomyositis without myopathy: Secondary | ICD-10-CM | POA: Diagnosis not present

## 2023-10-07 DIAGNOSIS — Z6835 Body mass index (BMI) 35.0-35.9, adult: Secondary | ICD-10-CM | POA: Diagnosis not present

## 2023-10-07 DIAGNOSIS — K0889 Other specified disorders of teeth and supporting structures: Secondary | ICD-10-CM | POA: Diagnosis not present

## 2023-10-07 DIAGNOSIS — E6609 Other obesity due to excess calories: Secondary | ICD-10-CM | POA: Diagnosis not present

## 2023-10-07 DIAGNOSIS — M3313 Other dermatomyositis without myopathy: Secondary | ICD-10-CM | POA: Diagnosis not present

## 2023-10-15 ENCOUNTER — Encounter: Payer: Medicaid Other | Admitting: Student

## 2023-10-19 DIAGNOSIS — M3313 Other dermatomyositis without myopathy: Secondary | ICD-10-CM | POA: Diagnosis not present

## 2023-11-02 DIAGNOSIS — M3313 Other dermatomyositis without myopathy: Secondary | ICD-10-CM | POA: Diagnosis not present

## 2023-11-13 ENCOUNTER — Encounter: Payer: Self-pay | Admitting: Student

## 2023-11-15 ENCOUNTER — Telehealth: Payer: Self-pay | Admitting: Neurology

## 2023-11-15 ENCOUNTER — Telehealth: Payer: Self-pay | Admitting: *Deleted

## 2023-11-15 DIAGNOSIS — I10 Essential (primary) hypertension: Secondary | ICD-10-CM

## 2023-11-15 MED ORDER — BLOOD PRESSURE KIT: PACK | 0 refills | Status: AC

## 2023-11-15 NOTE — Telephone Encounter (Signed)
Called and spoke to the patient who requests blood pressure cuff prescription as well as information on his neurologist office.  I provided him with the information for [facility] and his previous doctor's name Dr. Frances Furbish.  He will call their office as well as call our front desk to set up a follow-up visit to check his blood pressure and discuss his headaches if he is unable to get back in with neurology.

## 2023-11-15 NOTE — Telephone Encounter (Signed)
Pt's wife calling to schedule appt. Advised she will need to speak with billing regarding balance before we could schedule. Transferred to billing

## 2023-11-16 DIAGNOSIS — M3313 Other dermatomyositis without myopathy: Secondary | ICD-10-CM | POA: Diagnosis not present

## 2023-12-03 ENCOUNTER — Encounter: Payer: Medicaid Other | Admitting: Internal Medicine

## 2023-12-03 ENCOUNTER — Encounter: Payer: Self-pay | Admitting: Internal Medicine

## 2023-12-03 NOTE — Progress Notes (Deleted)
 CC: Headaches  HPI:  [name] is a 43 y.o. with medical history of Idiopathic Intracranial Hypertension, HTN, Dermatomyositis, and GERD presenting to [facility] for complaint of headaches. Previously had left sided headaches and was diagnosed with IIH. Did not tolerate diamox  and has not been adherent his topamax .   Please see problem-based list for further details, assessments, and plans.  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 while inpatient on the basis of elevated SIL2R, low NK cell level, hepatomegaly, elevated HScore (reaching 88%), proximal muscle weakness (neck flexors/extensors and proximal hip and shoulder flexors/extensors), elevated SIL2R, rash with interface dermatitis on skin biopsy, MRI findings suggestive of in     Current Outpatient Medications (Cardiovascular):    amLODipine  (NORVASC ) 10 MG tablet, Take 1 tablet (10 mg total) by mouth daily.   diltiazem  (CARDIZEM  CD) 120 MG 24 hr capsule, Take 1 capsule (120 mg total) by mouth daily.   olmesartan  (BENICAR ) 20 MG tablet, Take 1 tablet (20 mg total) by mouth daily.   spironolactone (ALDACTONE) 25 MG tablet, Take 1 tablet by mouth daily.   Current Outpatient Medications (Analgesics):    meloxicam  (MOBIC ) 15 MG tablet, TAKE 1 TABLET BY MOUTH DAILY AS NEEDED.   oxyCODONE  (ROXICODONE ) 5 MG immediate release tablet, Take 1 tablet (5 mg total) by mouth every 4 (four) hours as needed for severe pain.  Current Outpatient Medications (Hematological):    cyanocobalamin  1000 MCG tablet, Take 1,000 mcg by mouth daily.   folic acid  (FOLVITE ) 1 MG tablet, Take 1 tablet by mouth daily.  Current Outpatient Medications (Other):    azaTHIOprine  (IMURAN ) 50 MG tablet, Take 150 mg by mouth daily.   Blood Pressure KIT, Use daily or  twice daily   Cholecalciferol 25 MCG (1000 UT) capsule, Take 1,000 Units by mouth daily.   clobetasol (TEMOVATE) 0.05 % external solution, Apply 1 Application topically 2 (two) times daily.   clobetasol cream (TEMOVATE) 0.05 %, Apply 1 Application topically 2 (two) times daily.   hydrocortisone  2.5 % cream, Apply 1 Application topically daily. Please apply to itchy areas of face daily   hydroxychloroquine (PLAQUENIL) 200 MG tablet, Take 200 mg by mouth 2 (two) times daily.   hydrOXYzine  (ATARAX ) 25 MG tablet, TAKE 1 TABLET (25 MG TOTAL) BY MOUTH AS NEEDED.   ketoconazole  (NIZORAL ) 2 % cream, Apply to itchy areas on face daily   ketoconazole  (NIZORAL ) 2 % shampoo, Apply 1 Application topically 2 (two) times a week.   Multiple Vitamin (MULTI-VITAMIN) tablet, Take 1 tablet by mouth daily.   pantoprazole  (PROTONIX ) 40 MG tablet, Take 1 tablet (40 mg total) by mouth daily.   triamcinolone  ointment (KENALOG ) 0.1 %, Apply 1 Application topically 2 (two) times daily. For flares only  Review of Systems:  Review of system negative unless stated in the problem list or HPI.    Physical Exam:  There were no vitals filed for this visit. Physical Exam General: NAD HENT: NCAT Lungs: CTAB, no wheeze, rhonchi or rales.  Cardiovascular: Normal heart sounds, no r/m/g, 2+ pulses in all extremities. No LE edema Abdomen: No TTP, normal bowel sounds MSK: No asymmetry or muscle atrophy.  Skin: no lesions noted on exposed skin Neuro: Alert and oriented x4. CN grossly intact Psych: Normal mood and normal affect   Assessment &  Plan:   No problem-specific Assessment & Plan notes found for this encounter.   See Encounters Tab for problem based charting.  Patient Discussed with Dr. {WJFZD:[id number]::Hlpoonli,Ynqqfjw,Floozw,Wjmzwimj,Cpwrzwu,Fjryzw,Ojl,Yjuryzm,Tpoopjfd} Morene Bathe, MD Jol[facility] DEL. [facility] Internal Medicine Residency, PGY-3

## 2023-12-06 DIAGNOSIS — D761 Hemophagocytic lymphohistiocytosis: Secondary | ICD-10-CM | POA: Diagnosis not present

## 2023-12-06 DIAGNOSIS — L942 Calcinosis cutis: Secondary | ICD-10-CM | POA: Diagnosis not present

## 2023-12-06 DIAGNOSIS — G932 Benign intracranial hypertension: Secondary | ICD-10-CM | POA: Diagnosis not present

## 2023-12-06 DIAGNOSIS — M3313 Other dermatomyositis without myopathy: Secondary | ICD-10-CM | POA: Diagnosis not present

## 2023-12-06 DIAGNOSIS — R809 Proteinuria, unspecified: Secondary | ICD-10-CM | POA: Diagnosis not present

## 2023-12-06 DIAGNOSIS — E559 Vitamin D deficiency, unspecified: Secondary | ICD-10-CM | POA: Diagnosis not present

## 2023-12-06 DIAGNOSIS — Z79899 Other long term (current) drug therapy: Secondary | ICD-10-CM | POA: Diagnosis not present

## 2023-12-14 DIAGNOSIS — M3313 Other dermatomyositis without myopathy: Secondary | ICD-10-CM | POA: Diagnosis not present

## 2023-12-14 DIAGNOSIS — Z79899 Other long term (current) drug therapy: Secondary | ICD-10-CM | POA: Diagnosis not present

## 2023-12-18 DIAGNOSIS — M3313 Other dermatomyositis without myopathy: Secondary | ICD-10-CM | POA: Diagnosis not present

## 2023-12-28 DIAGNOSIS — M3313 Other dermatomyositis without myopathy: Secondary | ICD-10-CM | POA: Diagnosis not present

## 2023-12-28 DIAGNOSIS — Z79899 Other long term (current) drug therapy: Secondary | ICD-10-CM | POA: Diagnosis not present

## 2024-01-04 DIAGNOSIS — Z1329 Encounter for screening for other suspected endocrine disorder: Secondary | ICD-10-CM | POA: Diagnosis not present

## 2024-01-04 DIAGNOSIS — Z13220 Encounter for screening for lipoid disorders: Secondary | ICD-10-CM | POA: Diagnosis not present

## 2024-01-04 DIAGNOSIS — Z0000 Encounter for general adult medical examination without abnormal findings: Secondary | ICD-10-CM | POA: Diagnosis not present

## 2024-01-04 DIAGNOSIS — L942 Calcinosis cutis: Secondary | ICD-10-CM | POA: Diagnosis not present

## 2024-01-04 DIAGNOSIS — E559 Vitamin D deficiency, unspecified: Secondary | ICD-10-CM | POA: Diagnosis not present

## 2024-01-04 DIAGNOSIS — E538 Deficiency of other specified B group vitamins: Secondary | ICD-10-CM | POA: Diagnosis not present

## 2024-01-04 DIAGNOSIS — Z9189 Other specified personal risk factors, not elsewhere classified: Secondary | ICD-10-CM | POA: Diagnosis not present

## 2024-01-04 DIAGNOSIS — Z131 Encounter for screening for diabetes mellitus: Secondary | ICD-10-CM | POA: Diagnosis not present

## 2024-01-10 DIAGNOSIS — J984 Other disorders of lung: Secondary | ICD-10-CM | POA: Diagnosis not present

## 2024-01-10 DIAGNOSIS — M3313 Other dermatomyositis without myopathy: Secondary | ICD-10-CM | POA: Diagnosis not present

## 2024-01-17 ENCOUNTER — Encounter: Payer: Medicaid Other | Admitting: Student

## 2024-01-17 NOTE — Progress Notes (Deleted)
***       CC: {Clinic Visit Type:31040}  HPI:  Andrew Byrd is a 43 y.o. male with pertinent PMH of HTN, idiopathic intracranial hypertension, anxiety, and dermatomyositis who presents as above. Please see assessment and plan below for further details.  Review of Systems:   Pertinent items noted in HPI and/or A&P.  Physical Exam:  There were no vitals filed for this visit.  Constitutional:***. In no acute distress. HEENT: Normocephalic, atraumatic, Sclera non-icteric, PERRL, EOM intact Cardio:Regular rate and rhythm. 2+ bilateral {PulseLoc:28294} pulses. Pulm:Clear to auscultation bilaterally. Normal work of breathing on room air. Abdomen: Soft, non-tender, non-distended, positive bowel sounds. WGN:FAOZHYQM for extremity edema. Skin:Warm and dry. Neuro:Alert and oriented x3. No focal deficit noted. Psych:Pleasant mood and affect.   Assessment & Plan:   No problem-specific Assessment & Plan notes found for this encounter.    Patient {GC/GE:[id number]::"discussed with","seen with"} Dr. {NAMES:[id number]::"Lau","Guilloud","Hoffman","Machen","Chambliss","Winfrey","Williams","Vincent"}  Rocky Morel, DO [facility] Internal Medicine Resident PGY-2 Clinic Phone: [phone] Pager: [phone]

## 2024-01-31 DIAGNOSIS — D761 Hemophagocytic lymphohistiocytosis: Secondary | ICD-10-CM | POA: Diagnosis not present

## 2024-01-31 DIAGNOSIS — R801 Persistent proteinuria, unspecified: Secondary | ICD-10-CM | POA: Diagnosis not present

## 2024-01-31 DIAGNOSIS — I10 Essential (primary) hypertension: Secondary | ICD-10-CM | POA: Diagnosis not present

## 2024-01-31 DIAGNOSIS — D61818 Other pancytopenia: Secondary | ICD-10-CM | POA: Diagnosis not present

## 2024-01-31 DIAGNOSIS — E559 Vitamin D deficiency, unspecified: Secondary | ICD-10-CM | POA: Diagnosis not present

## 2024-03-10 ENCOUNTER — Ambulatory Visit: Admitting: Student

## 2024-03-10 ENCOUNTER — Ambulatory Visit: Payer: Self-pay | Admitting: *Deleted

## 2024-03-10 ENCOUNTER — Encounter: Payer: Self-pay | Admitting: Student

## 2024-03-10 VITALS — BP 123/72 | HR 80 | Temp 99.0°F | Ht 72.0 in | Wt 238.5 lb

## 2024-03-10 DIAGNOSIS — L942 Calcinosis cutis: Secondary | ICD-10-CM

## 2024-03-10 DIAGNOSIS — M7989 Other specified soft tissue disorders: Secondary | ICD-10-CM | POA: Diagnosis not present

## 2024-03-10 MED ORDER — OXYCODONE HCL 5 MG PO TABS: 5.0000 mg | ORAL_TABLET | Freq: Four times a day (QID) | ORAL | 0 refills | Status: AC | PRN

## 2024-03-10 NOTE — Telephone Encounter (Signed)
 Pt has an appt today @ 1415PM w/Dr Garald Jumbo.

## 2024-03-10 NOTE — Progress Notes (Signed)
 Subjective:  CC: Bilateral behind the knee nodules causing pain  HPI:  [name] is a 44 y.o. male with a past medical history stated below and presents today for bilateral behind the knee and right axillary nodules causing severe pain.  Of note, [name] is seen at [facility] Rheumatology and dermatology. He receives IVIG infusions and is on Mycophenolate therapy  Please see problem based assessment and plan for additional details.   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 H/o hepatic steatosis likely contributed at first. In patient liver biopsy initially planned, later deferred. 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  Dx by dermatology in [place] 12/2021. Prescribed 2.5% hydrocortisone cream for face.       Current Outpatient Medications on File Prior to Visit  Medication Sig Dispense Refill   amLODipine (NORVASC) 10 MG tablet Take 1 tablet (10 mg total) by mouth daily. 90 tablet 3   azaTHIOprine (IMURAN) 50 MG tablet Take 150 mg by mouth daily.     Blood Pressure  KIT Use daily or twice daily 1 kit 0   Cholecalciferol 25 MCG (1000 UT) capsule Take 1,000 Units by mouth daily.     clobetasol (TEMOVATE) 0.05 % external solution Apply 1 Application topically 2 (two) times daily.     clobetasol cream (TEMOVATE) 0.05 % Apply 1 Application topically 2 (two) times daily.     cyanocobalamin 1000 MCG tablet Take 1,000 mcg by mouth daily.     diltiazem (CARDIZEM CD) 120 MG 24 hr capsule Take 1 capsule (120 mg total) by mouth daily. 90 capsule 3   folic acid (FOLVITE) 1 MG tablet Take 1 tablet by mouth daily.     hydrocortisone 2.5 % cream Apply 1 Application topically daily. Please apply to itchy areas of face daily     hydroxychloroquine (PLAQUENIL) 200 MG tablet Take 200 mg by mouth 2 (two) times daily.     hydrOXYzine (ATARAX) 25 MG tablet TAKE 1 TABLET (25 MG TOTAL) BY MOUTH AS NEEDED. 90 tablet 1   ketoconazole (NIZORAL) 2 % cream Apply to itchy areas on face daily     ketoconazole (NIZORAL) 2 % shampoo Apply 1 Application topically 2 (two) times a week. 120 mL 0   meloxicam (MOBIC) 15 MG tablet TAKE 1 TABLET BY MOUTH DAILY AS NEEDED. 30 tablet 0   Multiple Vitamin (MULTI-VITAMIN) tablet Take 1 tablet by mouth daily.     olmesartan (BENICAR) 20 MG tablet Take 1 tablet (20 mg total) by mouth daily. 90 tablet 3   pantoprazole (PROTONIX) 40 MG tablet Take 1 tablet (40 mg total) by mouth daily. 90 tablet 1   spironolactone (  ALDACTONE) 25 MG tablet Take 1 tablet by mouth daily.     triamcinolone ointment (KENALOG) 0.1 % Apply 1 Application topically 2 (two) times daily. For flares only 30 g 0   No current facility-administered medications on file prior to visit.    Family History  Problem Relation Age of Onset   Hypertension Other    Cancer Mother     Social History   Socioeconomic History   Marital status: Married    Spouse name: Steward Elizabeth   Number of children: 6   Years of education: Not on file   Highest education level: GED or equivalent   Occupational History    Comment: NA, disabled  Tobacco Use   Smoking status: Never   Smokeless tobacco: Never  Vaping Use   Vaping status: Never Used  Substance and Sexual Activity   Alcohol use: No   Drug use: Yes    Types: Marijuana    Comment: 12/02/20 3 x week-appetite   Sexual activity: Yes    Partners: Female  Other Topics Concern   Not on file  Social History Narrative   Lives with wife, children   Caffeine, sodas/tea- all day   Social Drivers of Corporate investment banker Strain: Not on file  Food Insecurity: Low Risk  (01/31/2024)   Received from [facility]   Hunger Vital Sign    Worried About Running Out of Food in the Last Year: Never true    Ran Out of Food in the Last Year: Never true  Transportation Needs: No Transportation Needs (01/31/2024)   Received from [facility]    In the past 12 months, has lack of reliable transportation kept you from medical appointments, meetings, work or from getting things needed for daily living? : No  Recent Concern: Transportation Needs - Unmet Transportation Needs (01/04/2024)   Received from [facility]    In the past 12 months, has lack of reliable transportation kept you from medical appointments, meetings, work or from getting things needed for daily living? : Yes  Physical Activity: Not on file  Stress: Not on file  Social Connections: Moderately Integrated (01/16/2023)   Social Connection and Isolation Panel [NHANES]    Frequency of Communication with Friends and Family: More than three times a week    Frequency of Social Gatherings with Friends and Family: More than three times a week    Attends Religious Services: More than 4 times per year    Active Member of [employer] or Organizations: No    Attends Banker Meetings: Never    Marital Status: Living with partner  Intimate Partner Violence: Not At Risk (01/16/2023)   Humiliation, Afraid, Rape, and Kick questionnaire     Fear of Current or Ex-Partner: No    Emotionally Abused: No    Physically Abused: No    Sexually Abused: No    Review of Systems: ROS negative except for what is noted on the assessment and plan.  Objective:   Vitals:   03/10/24 1423  BP: 123/72  Pulse: 80  Temp: 99 F (37.2 C)  TempSrc: Oral  SpO2: 100%  Weight: 238 lb 8 oz (108.2 kg)  Height: 6' (1.829 m)    Physical Exam: Constitutional: tired-appearing man sitting in chair, in acute distress HENT: normocephalic atraumatic, mucous membranes moist Eyes: conjunctiva non-erythematous Neck: supple Cardiovascular: regular rate and rhythm, no m/r/g Pulmonary/Chest: normal work of breathing on room air, lungs clear to auscultation  bilaterally Abdominal: soft, non-tender, non-distended, hard nodules on subcutaneous tissue throughout, nontender. MSK: normal bulk and tone. ~2-3 cm hard tender nodule on subcutaneous tissue medial to the popliteal fossa on bilateral lower extremities. No joint line tenderness or effusions on bilateral knees.  Neurological: alert & oriented x 3, 5/5 strength in bilateral upper and lower extremities, normal gait Skin: warm and dry; xerosis noted throughout Psych: Pleasant though sad mood and affect       03/10/2024    2:25 PM  Depression screen PHQ 2/9  Decreased Interest 2  Down, Depressed, Hopeless 1  PHQ - 2 Score 3  Altered sleeping 3  Tired, decreased energy 3  Change in appetite 3  Feeling bad or failure about yourself  0  Trouble concentrating 0  Moving slowly or fidgety/restless 0  Suicidal thoughts 0  PHQ-9 Score 12  Difficult doing work/chores Very difficult       10/13/2020   10:07 AM  GAD 7 : Generalized Anxiety Score  Nervous, Anxious, on Edge 0  Control/stop worrying 0  Worry too much - different things 0  Trouble relaxing 1  Restless 0  Easily annoyed or irritable 0  Afraid - awful might happen 0  Total GAD 7 Score 1  Anxiety Difficulty Not difficult at all      Assessment & Plan:   Calcinosis cutis Patient with 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 On prior XR, there is evidence of calcinosis. Patient reports that this has progressively worsened in the past year or so.   He has presented in the ED once before because of pain in these sites, though the nodules in these areas along with nodules throughout his trunk have progressively gotten bigger. He rates his pain 10/10. He denied fevers, URI or infectious symptoms, weakness, recent falls.   He is seen by rheumatology and dermatology at [facility]. He was to go to an infusion last Friday, but was unable to move because of pain. Currently he is on diltiazem 120 mg daily, amlodipine 10 mg daily, Mycophenolate 500 mg BID. I am not able to see dispensed Azathioprine or Hydroxychloroquine since 2024.  On exam, there are ~2-3 cm hard tender nodule on subcutaneous tissue medial to the popliteal fossa on bilateral lower extremities. There is also one tender hard nodule, fixed to the skin on the R anterior axillae, smaller, ~1cm. No erythema, fluctuance overlying the skin. Bilateral pulses present, no edema, and no pain with bilateral foot movement. There is no joint synovitis or knee effusions appreciated.  Suspect calcinosis, either new deposition or necrosis, causing pain. Patient is known to have a difficult time with lab draws at our office. Will contact his rheumatologist (whom he will see on 4/17) and dermatologist about repeating labs, namely CBC, RFP, ANA.  He usually gets laboratory work at his infusion center, which is also located at [facility]. Will also inquire about step up therapy for this condition; query the use of calcium thiosulfate injection  into the lesions vs topical therapy vs iV infusion therapy given the burden of disease spread through his trunk.   For now, will prescribe a short course (3 days) of q6HR PRN oxycodone given he has tried conservative management (OTC pain medications and heating pad) without success. Precautionary symptoms reviewed.  Return in about 2 months (around 05/10/2024) for Calcinosis follow up and care coordination.  Patient seen with Dr. Donnel Gail, MD [facility] Internal Medicine Residency Program  03/10/2024, 6:47 PM

## 2024-03-10 NOTE — Patient Instructions (Addendum)
 Thank you, Andrew Byrd for allowing us  to provide your care today. Today we discussed your painful nodules - we believe this is because calcium deposition.  I will email your rheumatologist and dermatologist. I will ask them to order some labs to be done on Friday at your next infusion appointment. For now, to treat the pain, I ordered some oxycodone. Please take only when needed and do not drive or operate machinery when you take it. - 5 mg tablet every 6 hrs as needed for severe pain - Continue taking acetaminophen - Use heated blanket as needed  If the pain is uncontrolled, please visit the Urgent Care or Emergency department.    We look forward to seeing you next time. Please call our clinic at [phone] if you have any questions or concerns. The best time to call is Monday-Friday from 9am-4pm, but there is someone available 24/7. If after hours or the weekend, call the main hospital number and ask for the Internal Medicine Resident On-Call. If you need medication refills, please notify your pharmacy one week in advance and they will send us  a request.   Thank you for letting us  take part in your care. Wishing you the best!  Andrew Clunes, MD 03/10/2024, 3:02 PM [facility]

## 2024-03-10 NOTE — Telephone Encounter (Signed)
 Copied from CRM [id number]. Topic: Clinical - Red Word Triage >> Mar 10, 2024  9:52 AM Adrianna P wrote: Red Word that prompted transfer to Nurse Triage: pain in both legs patient rates it a 9 out of 10. He states he can barely walk. Pain under right arm. Has been going on for 5 days and getting worse. Reason for Disposition  [1] MODERATE pain (e.g., interferes with normal activities, limping) AND [2] present > 3 days  Answer Assessment - Initial Assessment Questions 1. ONSET: "When did the pain start?"      I'm having pain in both of my legs.   I have calcium deposits.   It started last Monday and getting worse and worse.   It's hurting real bad now.    2. LOCATION: "Where is the pain located?"      A little below and behind my knees.    3. PAIN: "How bad is the pain?"    (Scale 1-10; or mild, moderate, severe)   -  MILD (1-3): doesn't interfere with normal activities    -  MODERATE (4-7): interferes with normal activities (e.g., work or school) or awakens from sleep, limping    -  SEVERE (8-10): excruciating pain, unable to do any normal activities, unable to walk     Severe 4. WORK OR EXERCISE: "Has there been any recent work or exercise that involved this part of the body?"      I have calcium deposits 5. CAUSE: "What do you think is causing the leg pain?"     Calcium deposits I had pain meds when I went to the ED a while back.   No pain medication now. 6. OTHER SYMPTOMS: "Do you have any other symptoms?" (e.g., chest pain, back pain, breathing difficulty, swelling, rash, fever, numbness, weakness)     I'm having trouble walking.   When first stand up I have to balance myself because it hurts so bad. I'm having pain under my right arm.   Calcium deposits there too. 7. PREGNANCY: "Is there any chance you are pregnant?" "When was your last menstrual period?"     N/A  Protocols used: Leg Pain-A-AH  Chief Complaint: Pain in both legs and under right arm from calcium deposits. Symptoms:  Pain getting worse.  Hard to walk. Frequency: Last Monday started and getting worse Pertinent Negatives: Patient denies injuries or falls Disposition: [] ED /[] Urgent Care (no appt availability in office) / [x] Appointment(In office/virtual)/ []  [facility] [facility]/ [] Home Care/ [] Refused Recommended Disposition /[] [place] [facility]/ []  Follow-up with PCP Additional Notes: Appt made for today at the [facility].

## 2024-03-10 NOTE — Assessment & Plan Note (Addendum)
 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 On prior XR, there is evidence of calcinosis. Patient reports that this has progressively worsened in the past year or so.   He has presented in the ED once before because of pain in these sites, though the nodules in these areas along with nodules throughout his trunk have progressively gotten bigger. He rates his pain 10/10. He denied fevers, URI or infectious symptoms, weakness, recent falls.   He is seen by rheumatology and dermatology at [facility]. He was to go to an infusion last Friday, but was unable to move because of pain. Currently he is on diltiazem 120 mg daily, amlodipine 10 mg daily, Mycophenolate 500 mg BID. I am not able to see dispensed Azathioprine or Hydroxychloroquine since 2024.  On exam, there are ~2-3 cm hard tender nodule on subcutaneous tissue medial to the popliteal fossa on bilateral lower extremities. There is also one tender hard nodule, fixed to the skin on the R anterior axillae, smaller, ~1cm. No erythema, fluctuance overlying the skin. Bilateral pulses present, no edema, and no pain with bilateral foot movement. There is no joint synovitis or knee effusions appreciated.  Suspect calcinosis, either new deposition or necrosis, causing pain. Patient is known to have a difficult time with lab draws at our office. Will contact his rheumatologist (whom he will see on 4/17) and dermatologist about repeating labs, namely CBC, RFP, ANA.  He usually gets laboratory work at his infusion center, which is also located at [facility]. Will also inquire about step up therapy for this condition; query the use of calcium thiosulfate injection into the lesions vs topical therapy vs iV  infusion therapy given the burden of disease spread through his trunk.   For now, will prescribe a short course (3 days) of q6HR PRN oxycodone given he has tried conservative management (OTC pain medications and heating pad) without success. Precautionary symptoms reviewed.

## 2024-03-12 DIAGNOSIS — M3313 Other dermatomyositis without myopathy: Secondary | ICD-10-CM | POA: Diagnosis not present

## 2024-03-12 DIAGNOSIS — J984 Other disorders of lung: Secondary | ICD-10-CM | POA: Diagnosis not present

## 2024-03-12 DIAGNOSIS — R918 Other nonspecific abnormal finding of lung field: Secondary | ICD-10-CM | POA: Diagnosis not present

## 2024-03-12 NOTE — Progress Notes (Signed)
Internal Medicine Clinic Attending  I was physically present during the key portions of the resident provided service and participated in the medical decision making of patient's management care. I reviewed pertinent patient test results.  The assessment, diagnosis, and plan were formulated together and I agree with the documentation in the resident's note.  Mercie Eon, MD

## 2024-03-13 DIAGNOSIS — E559 Vitamin D deficiency, unspecified: Secondary | ICD-10-CM | POA: Diagnosis not present

## 2024-03-13 DIAGNOSIS — D761 Hemophagocytic lymphohistiocytosis: Secondary | ICD-10-CM | POA: Diagnosis not present

## 2024-03-13 DIAGNOSIS — D61818 Other pancytopenia: Secondary | ICD-10-CM | POA: Diagnosis not present

## 2024-03-13 DIAGNOSIS — R809 Proteinuria, unspecified: Secondary | ICD-10-CM | POA: Diagnosis not present

## 2024-03-13 DIAGNOSIS — G932 Benign intracranial hypertension: Secondary | ICD-10-CM | POA: Diagnosis not present

## 2024-03-13 DIAGNOSIS — M3313 Other dermatomyositis without myopathy: Secondary | ICD-10-CM | POA: Diagnosis not present

## 2024-03-13 DIAGNOSIS — L942 Calcinosis cutis: Secondary | ICD-10-CM | POA: Diagnosis not present

## 2024-03-13 DIAGNOSIS — Z79899 Other long term (current) drug therapy: Secondary | ICD-10-CM | POA: Diagnosis not present

## 2024-03-15 DIAGNOSIS — N179 Acute kidney failure, unspecified: Secondary | ICD-10-CM | POA: Diagnosis not present

## 2024-03-15 DIAGNOSIS — E875 Hyperkalemia: Secondary | ICD-10-CM | POA: Diagnosis not present

## 2024-03-15 DIAGNOSIS — E871 Hypo-osmolality and hyponatremia: Secondary | ICD-10-CM | POA: Diagnosis not present

## 2024-03-16 DIAGNOSIS — N179 Acute kidney failure, unspecified: Secondary | ICD-10-CM | POA: Diagnosis not present

## 2024-03-16 DIAGNOSIS — I440 Atrioventricular block, first degree: Secondary | ICD-10-CM | POA: Diagnosis not present

## 2024-03-17 DIAGNOSIS — N179 Acute kidney failure, unspecified: Secondary | ICD-10-CM | POA: Diagnosis not present

## 2024-03-18 DIAGNOSIS — M3313 Other dermatomyositis without myopathy: Secondary | ICD-10-CM | POA: Diagnosis not present

## 2024-03-18 DIAGNOSIS — L853 Xerosis cutis: Secondary | ICD-10-CM | POA: Diagnosis not present

## 2024-03-18 DIAGNOSIS — L942 Calcinosis cutis: Secondary | ICD-10-CM | POA: Diagnosis not present

## 2024-03-28 DIAGNOSIS — M3313 Other dermatomyositis without myopathy: Secondary | ICD-10-CM | POA: Diagnosis not present

## 2024-03-31 DIAGNOSIS — R0602 Shortness of breath: Secondary | ICD-10-CM | POA: Diagnosis not present

## 2024-03-31 DIAGNOSIS — R0609 Other forms of dyspnea: Secondary | ICD-10-CM | POA: Diagnosis not present

## 2024-03-31 DIAGNOSIS — Z7689 Persons encountering health services in other specified circumstances: Secondary | ICD-10-CM | POA: Diagnosis not present

## 2024-03-31 DIAGNOSIS — R918 Other nonspecific abnormal finding of lung field: Secondary | ICD-10-CM | POA: Diagnosis not present

## 2024-03-31 DIAGNOSIS — R635 Abnormal weight gain: Secondary | ICD-10-CM | POA: Diagnosis not present

## 2024-03-31 DIAGNOSIS — R0989 Other specified symptoms and signs involving the circulatory and respiratory systems: Secondary | ICD-10-CM | POA: Diagnosis not present

## 2024-04-07 DIAGNOSIS — R600 Localized edema: Secondary | ICD-10-CM | POA: Diagnosis not present

## 2024-04-07 DIAGNOSIS — R590 Localized enlarged lymph nodes: Secondary | ICD-10-CM | POA: Diagnosis not present

## 2024-04-10 DIAGNOSIS — J984 Other disorders of lung: Secondary | ICD-10-CM | POA: Diagnosis not present

## 2024-04-10 DIAGNOSIS — R635 Abnormal weight gain: Secondary | ICD-10-CM | POA: Diagnosis not present

## 2024-04-10 DIAGNOSIS — J302 Other seasonal allergic rhinitis: Secondary | ICD-10-CM | POA: Diagnosis not present

## 2024-04-10 DIAGNOSIS — R0602 Shortness of breath: Secondary | ICD-10-CM | POA: Diagnosis not present

## 2024-04-11 DIAGNOSIS — E875 Hyperkalemia: Secondary | ICD-10-CM | POA: Diagnosis not present

## 2024-04-11 DIAGNOSIS — R600 Localized edema: Secondary | ICD-10-CM | POA: Diagnosis not present

## 2024-04-11 DIAGNOSIS — I10 Essential (primary) hypertension: Secondary | ICD-10-CM | POA: Diagnosis not present

## 2024-04-11 DIAGNOSIS — E8770 Fluid overload, unspecified: Secondary | ICD-10-CM | POA: Diagnosis not present

## 2024-04-11 DIAGNOSIS — D539 Nutritional anemia, unspecified: Secondary | ICD-10-CM | POA: Diagnosis not present

## 2024-04-11 DIAGNOSIS — E8779 Other fluid overload: Secondary | ICD-10-CM | POA: Diagnosis not present

## 2024-04-11 DIAGNOSIS — N179 Acute kidney failure, unspecified: Secondary | ICD-10-CM | POA: Diagnosis not present

## 2024-04-11 DIAGNOSIS — M3313 Other dermatomyositis without myopathy: Secondary | ICD-10-CM | POA: Diagnosis not present

## 2024-04-11 DIAGNOSIS — Z79621 Long term (current) use of calcineurin inhibitor: Secondary | ICD-10-CM | POA: Diagnosis not present

## 2024-04-11 DIAGNOSIS — D84821 Immunodeficiency due to drugs: Secondary | ICD-10-CM | POA: Diagnosis not present

## 2024-04-11 DIAGNOSIS — Z7952 Long term (current) use of systemic steroids: Secondary | ICD-10-CM | POA: Diagnosis not present

## 2024-04-11 DIAGNOSIS — R21 Rash and other nonspecific skin eruption: Secondary | ICD-10-CM | POA: Diagnosis not present

## 2024-04-11 DIAGNOSIS — R519 Headache, unspecified: Secondary | ICD-10-CM | POA: Diagnosis not present

## 2024-04-11 DIAGNOSIS — N183 Chronic kidney disease, stage 3 unspecified: Secondary | ICD-10-CM | POA: Diagnosis not present

## 2024-04-11 DIAGNOSIS — N055 Unspecified nephritic syndrome with diffuse mesangiocapillary glomerulonephritis: Secondary | ICD-10-CM | POA: Diagnosis not present

## 2024-04-11 DIAGNOSIS — E8809 Other disorders of plasma-protein metabolism, not elsewhere classified: Secondary | ICD-10-CM | POA: Diagnosis not present

## 2024-04-11 DIAGNOSIS — L853 Xerosis cutis: Secondary | ICD-10-CM | POA: Diagnosis not present

## 2024-04-11 DIAGNOSIS — Z79624 Long term (current) use of inhibitors of nucleotide synthesis: Secondary | ICD-10-CM | POA: Diagnosis not present

## 2024-04-11 DIAGNOSIS — R0602 Shortness of breath: Secondary | ICD-10-CM | POA: Diagnosis not present

## 2024-04-11 DIAGNOSIS — D761 Hemophagocytic lymphohistiocytosis: Secondary | ICD-10-CM | POA: Diagnosis not present

## 2024-04-12 DIAGNOSIS — E861 Hypovolemia: Secondary | ICD-10-CM | POA: Diagnosis not present

## 2024-04-12 DIAGNOSIS — E8770 Fluid overload, unspecified: Secondary | ICD-10-CM | POA: Diagnosis not present

## 2024-04-13 DIAGNOSIS — E8770 Fluid overload, unspecified: Secondary | ICD-10-CM | POA: Diagnosis not present

## 2024-04-13 DIAGNOSIS — E861 Hypovolemia: Secondary | ICD-10-CM | POA: Diagnosis not present

## 2024-04-13 DIAGNOSIS — R9431 Abnormal electrocardiogram [ECG] [EKG]: Secondary | ICD-10-CM | POA: Diagnosis not present

## 2024-04-14 DIAGNOSIS — I517 Cardiomegaly: Secondary | ICD-10-CM | POA: Diagnosis not present

## 2024-04-14 DIAGNOSIS — E8770 Fluid overload, unspecified: Secondary | ICD-10-CM | POA: Diagnosis not present

## 2024-04-15 DIAGNOSIS — E8770 Fluid overload, unspecified: Secondary | ICD-10-CM | POA: Diagnosis not present

## 2024-04-16 DIAGNOSIS — E8770 Fluid overload, unspecified: Secondary | ICD-10-CM | POA: Diagnosis not present

## 2024-04-17 DIAGNOSIS — N179 Acute kidney failure, unspecified: Secondary | ICD-10-CM | POA: Diagnosis not present

## 2024-04-17 DIAGNOSIS — N261 Atrophy of kidney (terminal): Secondary | ICD-10-CM | POA: Diagnosis not present

## 2024-04-17 DIAGNOSIS — N269 Renal sclerosis, unspecified: Secondary | ICD-10-CM | POA: Diagnosis not present

## 2024-04-17 DIAGNOSIS — I701 Atherosclerosis of renal artery: Secondary | ICD-10-CM | POA: Diagnosis not present

## 2024-04-17 DIAGNOSIS — E8770 Fluid overload, unspecified: Secondary | ICD-10-CM | POA: Diagnosis not present

## 2024-04-17 DIAGNOSIS — N183 Chronic kidney disease, stage 3 unspecified: Secondary | ICD-10-CM | POA: Diagnosis not present

## 2024-04-18 DIAGNOSIS — E8770 Fluid overload, unspecified: Secondary | ICD-10-CM | POA: Diagnosis not present

## 2024-05-02 DIAGNOSIS — M3313 Other dermatomyositis without myopathy: Secondary | ICD-10-CM | POA: Diagnosis not present

## 2024-05-12 DIAGNOSIS — E559 Vitamin D deficiency, unspecified: Secondary | ICD-10-CM | POA: Diagnosis not present

## 2024-05-12 DIAGNOSIS — R600 Localized edema: Secondary | ICD-10-CM | POA: Diagnosis not present

## 2024-06-06 DIAGNOSIS — R600 Localized edema: Secondary | ICD-10-CM | POA: Diagnosis not present

## 2024-06-06 DIAGNOSIS — M3313 Other dermatomyositis without myopathy: Secondary | ICD-10-CM | POA: Diagnosis not present

## 2024-06-06 DIAGNOSIS — E559 Vitamin D deficiency, unspecified: Secondary | ICD-10-CM | POA: Diagnosis not present

## 2024-06-20 DIAGNOSIS — M3313 Other dermatomyositis without myopathy: Secondary | ICD-10-CM | POA: Diagnosis not present

## 2024-06-20 DIAGNOSIS — R600 Localized edema: Secondary | ICD-10-CM | POA: Diagnosis not present

## 2024-06-20 DIAGNOSIS — E559 Vitamin D deficiency, unspecified: Secondary | ICD-10-CM | POA: Diagnosis not present

## 2024-07-03 DIAGNOSIS — Z13820 Encounter for screening for osteoporosis: Secondary | ICD-10-CM | POA: Diagnosis not present

## 2024-07-03 DIAGNOSIS — R809 Proteinuria, unspecified: Secondary | ICD-10-CM | POA: Diagnosis not present

## 2024-07-03 DIAGNOSIS — Z79899 Other long term (current) drug therapy: Secondary | ICD-10-CM | POA: Diagnosis not present

## 2024-07-03 DIAGNOSIS — Z7952 Long term (current) use of systemic steroids: Secondary | ICD-10-CM | POA: Diagnosis not present

## 2024-07-03 DIAGNOSIS — D761 Hemophagocytic lymphohistiocytosis: Secondary | ICD-10-CM | POA: Diagnosis not present

## 2024-07-03 DIAGNOSIS — E559 Vitamin D deficiency, unspecified: Secondary | ICD-10-CM | POA: Diagnosis not present

## 2024-07-03 DIAGNOSIS — M3313 Other dermatomyositis without myopathy: Secondary | ICD-10-CM | POA: Diagnosis not present

## 2024-07-03 DIAGNOSIS — J329 Chronic sinusitis, unspecified: Secondary | ICD-10-CM | POA: Diagnosis not present

## 2024-07-03 DIAGNOSIS — L942 Calcinosis cutis: Secondary | ICD-10-CM | POA: Diagnosis not present

## 2024-07-03 DIAGNOSIS — D61818 Other pancytopenia: Secondary | ICD-10-CM | POA: Diagnosis not present

## 2024-07-17 DIAGNOSIS — J984 Other disorders of lung: Secondary | ICD-10-CM | POA: Diagnosis not present

## 2024-07-17 DIAGNOSIS — M3313 Other dermatomyositis without myopathy: Secondary | ICD-10-CM | POA: Diagnosis not present

## 2024-07-17 DIAGNOSIS — L942 Calcinosis cutis: Secondary | ICD-10-CM | POA: Diagnosis not present

## 2024-07-17 DIAGNOSIS — R942 Abnormal results of pulmonary function studies: Secondary | ICD-10-CM | POA: Diagnosis not present

## 2024-07-18 DIAGNOSIS — M3313 Other dermatomyositis without myopathy: Secondary | ICD-10-CM | POA: Diagnosis not present

## 2024-07-18 DIAGNOSIS — Z79899 Other long term (current) drug therapy: Secondary | ICD-10-CM | POA: Diagnosis not present

## 2024-08-01 DIAGNOSIS — M3313 Other dermatomyositis without myopathy: Secondary | ICD-10-CM | POA: Diagnosis not present

## 2024-08-01 DIAGNOSIS — D61818 Other pancytopenia: Secondary | ICD-10-CM | POA: Diagnosis not present

## 2024-08-07 DIAGNOSIS — Z7952 Long term (current) use of systemic steroids: Secondary | ICD-10-CM | POA: Diagnosis not present

## 2024-08-07 DIAGNOSIS — D61818 Other pancytopenia: Secondary | ICD-10-CM | POA: Diagnosis not present

## 2024-08-07 DIAGNOSIS — Z13820 Encounter for screening for osteoporosis: Secondary | ICD-10-CM | POA: Diagnosis not present

## 2024-08-08 DIAGNOSIS — N051 Unspecified nephritic syndrome with focal and segmental glomerular lesions: Secondary | ICD-10-CM | POA: Diagnosis not present

## 2024-08-08 DIAGNOSIS — M3313 Other dermatomyositis without myopathy: Secondary | ICD-10-CM | POA: Diagnosis not present

## 2024-08-08 DIAGNOSIS — R801 Persistent proteinuria, unspecified: Secondary | ICD-10-CM | POA: Diagnosis not present

## 2024-08-08 DIAGNOSIS — N1832 Chronic kidney disease, stage 3b: Secondary | ICD-10-CM | POA: Diagnosis not present

## 2024-08-08 DIAGNOSIS — R809 Proteinuria, unspecified: Secondary | ICD-10-CM | POA: Diagnosis not present

## 2024-08-08 DIAGNOSIS — E875 Hyperkalemia: Secondary | ICD-10-CM | POA: Diagnosis not present

## 2024-08-08 DIAGNOSIS — I10 Essential (primary) hypertension: Secondary | ICD-10-CM | POA: Diagnosis not present

## 2024-08-08 DIAGNOSIS — N179 Acute kidney failure, unspecified: Secondary | ICD-10-CM | POA: Diagnosis not present

## 2024-08-18 DIAGNOSIS — J0121 Acute recurrent ethmoidal sinusitis: Secondary | ICD-10-CM | POA: Diagnosis not present

## 2024-08-18 DIAGNOSIS — R0981 Nasal congestion: Secondary | ICD-10-CM | POA: Diagnosis not present

## 2024-08-25 DIAGNOSIS — G894 Chronic pain syndrome: Secondary | ICD-10-CM | POA: Diagnosis not present

## 2024-08-25 DIAGNOSIS — L942 Calcinosis cutis: Secondary | ICD-10-CM | POA: Diagnosis not present

## 2024-09-02 ENCOUNTER — Ambulatory Visit: Admitting: Student

## 2024-09-02 NOTE — Progress Notes (Deleted)
***      CC: Follow-up visit after last office visit 03/10/2024 and hospitalizations at [facility] in 03/16/2024 and 04/15/2024  HPI:  Morrison Mcbryar is a 43 y.o. male with pertinent PMH of *** who presents as above. Please see assessment and plan below for further details.  Medications: Current Outpatient Medications  Medication Instructions   amLODipine  (NORVASC ) 10 mg, Oral, Daily   azaTHIOprine  (IMURAN ) 150 mg, Oral, Daily   Blood Pressure KIT Use daily or twice daily   Cholecalciferol 1,000 Units, Oral, Daily   clobetasol (TEMOVATE) 0.05 % external solution 1 Application, Topical, 2 times daily   clobetasol cream (TEMOVATE) 0.05 % 1 Application, Topical, 2 times daily   cyanocobalamin  1,000 mcg, Oral, Daily   diltiazem  (CARDIZEM  CD) 120 mg, Oral, Daily   folic acid  (FOLVITE ) 1 MG tablet 1 tablet, Oral, Daily   hydrocortisone  2.5 % cream 1 Application, Topical, Daily, Please apply to itchy areas of face daily     hydroxychloroquine (PLAQUENIL) 200 mg, Oral, 2 times daily   hydrOXYzine  (ATARAX ) 25 mg, Oral, As needed   ketoconazole  (NIZORAL ) 2 % cream Apply to itchy areas on face daily   ketoconazole  (NIZORAL ) 2 % shampoo 1 Application, Topical, 2 times weekly   meloxicam  (MOBIC ) 15 mg, Oral, Daily PRN   Multiple Vitamin (MULTI-VITAMIN) tablet 1 tablet, Oral, Daily   olmesartan  (BENICAR ) 20 mg, Oral, Daily   pantoprazole  (PROTONIX ) 40 mg, Oral, Daily   spironolactone (ALDACTONE) 25 MG tablet 1 tablet, Oral, Daily   triamcinolone  ointment (KENALOG ) 0.1 % 1 Application, Topical, 2 times daily, For flares only     Review of Systems:   Pertinent items noted in HPI and/or A&P.  Physical Exam:  There were no vitals filed for this visit.  Constitutional:***. In no acute distress. HEENT: Normocephalic, atraumatic, Sclera non-icteric, PERRL, EOM intact Cardio:Regular rate and rhythm. 2+ bilateral {PulseLoc:28294} pulses. Pulm:Clear to auscultation bilaterally. Normal work of  breathing on room air. Abdomen: Soft, non-tender, non-distended, positive bowel sounds. FDX:Wzhjupcz for extremity edema. Skin:Warm and dry. Neuro:Alert and oriented x3. No focal deficit noted. Psych:Pleasant mood and affect.   Assessment & Plan:   Assessment & Plan   No orders of the defined types were placed in this encounter.    No follow-ups on file.   Patient {GC/GE:[id number]::discussed with,seen with} {JGIMTSattending2025/2026:32954}  Fairy Pool, DO [facility] Internal Medicine Resident PGY-3 Clinic Phone: [phone] Please contact the on call pager at [phone] for any urgent or emergent needs.

## 2024-10-03 DIAGNOSIS — I10 Essential (primary) hypertension: Secondary | ICD-10-CM | POA: Diagnosis not present

## 2024-10-03 DIAGNOSIS — M3313 Other dermatomyositis without myopathy: Secondary | ICD-10-CM | POA: Diagnosis not present

## 2024-10-03 DIAGNOSIS — E875 Hyperkalemia: Secondary | ICD-10-CM | POA: Diagnosis not present

## 2024-11-06 DIAGNOSIS — R801 Persistent proteinuria, unspecified: Secondary | ICD-10-CM | POA: Diagnosis not present

## 2024-11-06 DIAGNOSIS — I10 Essential (primary) hypertension: Secondary | ICD-10-CM | POA: Diagnosis not present

## 2024-11-06 DIAGNOSIS — K219 Gastro-esophageal reflux disease without esophagitis: Secondary | ICD-10-CM | POA: Diagnosis not present

## 2024-11-06 DIAGNOSIS — N2581 Secondary hyperparathyroidism of renal origin: Secondary | ICD-10-CM | POA: Diagnosis not present

## 2024-11-06 DIAGNOSIS — Z79899 Other long term (current) drug therapy: Secondary | ICD-10-CM | POA: Diagnosis not present

## 2024-11-06 DIAGNOSIS — E559 Vitamin D deficiency, unspecified: Secondary | ICD-10-CM | POA: Diagnosis not present

## 2024-11-06 DIAGNOSIS — N184 Chronic kidney disease, stage 4 (severe): Secondary | ICD-10-CM | POA: Diagnosis not present

## 2024-11-06 DIAGNOSIS — I129 Hypertensive chronic kidney disease with stage 1 through stage 4 chronic kidney disease, or unspecified chronic kidney disease: Secondary | ICD-10-CM | POA: Diagnosis not present

## 2024-11-06 DIAGNOSIS — Z7952 Long term (current) use of systemic steroids: Secondary | ICD-10-CM | POA: Diagnosis not present
